# Patient Record
Sex: Female | Born: 1945 | Race: Black or African American | Hispanic: No | Marital: Married
Health system: Midwestern US, Community
[De-identification: ages and names within clinical notes are randomized; demographics above are authoritative.]

## PROBLEM LIST (undated history)

## (undated) ENCOUNTER — Ambulatory Visit (HOSPITAL_COMMUNITY)

## (undated) DIAGNOSIS — G309 Alzheimer's disease, unspecified: Secondary | ICD-10-CM

## (undated) DIAGNOSIS — F0280 Dementia in other diseases classified elsewhere without behavioral disturbance: Secondary | ICD-10-CM

## (undated) DIAGNOSIS — E119 Type 2 diabetes mellitus without complications: Secondary | ICD-10-CM

## (undated) DIAGNOSIS — F0390 Unspecified dementia without behavioral disturbance: Secondary | ICD-10-CM

## (undated) DIAGNOSIS — I10 Essential (primary) hypertension: Secondary | ICD-10-CM

## (undated) DIAGNOSIS — M48061 Spinal stenosis, lumbar region without neurogenic claudication: Secondary | ICD-10-CM

## (undated) DIAGNOSIS — M47816 Spondylosis without myelopathy or radiculopathy, lumbar region: Secondary | ICD-10-CM

## (undated) DIAGNOSIS — M47896 Other spondylosis, lumbar region: Secondary | ICD-10-CM

## (undated) HISTORY — PX: ABDOMINAL HYSTERECTOMY: SHX81

## (undated) HISTORY — PX: COLON SURGERY: SHX602

---

## 1998-10-05 NOTE — Progress Notes (Signed)
[facility]                       PHYSICAL THERAPY INITIAL EVALUATION   PATIENT NAME:  Angela, Potter   MR#:  [id number]   DATE:  10/01/1998   REFERRING PHYSICIAN:  Elvera Maria, M.D.   SUBJECTIVE:  The patient is a 53 year old black female with complaints of   left gluteal pain beginning two months ago. She was referred to physical   therapy for low back pain 3x a week for two weeks.  She has been given no   precautions.   HISTORY OF PRESENT ILLNESS:  The patient states her left gluteal region   feels as though as knife is stabbing her.  In the morning it is the worst.   She is unable to stand erect and hobbles into the bathroom.  She states it   does ease off after her shower and with movement.  Again in the evening,   when she starts resting and getting ready for bed, the pain does begin to   appear again.  She is unable to sleep and the medications are helping.  She   states this pain began approximately two months ago with no known reason.   She rates her pain as a 1-10/10.   MEDICATIONS:  Lozol, ACE inhibitor, oral medication for diabetes, Celebrex,   Flexeril and currently her Celebrex was changed to Vioxx.   ALLERGIES:  She has no known allergies.   PAST MEDICAL HISTORY:  She states she has arthritis in her left knee,   diabetes, and high blood pressure.  She has not received any x-rays and she   has not had physical therapy before.   OJBECTIVE:  The patient is overweight with increased lordosis.  Range of   motion of her trunk is within normal limits.  Manual muscle testing of her   lower extremities was not tested at this time.   PALPATION:  The patient exhibits tenderness and tightness of her right   L5-S1 and PSIS area. The therapist was unable to palpate or elicit pain in   her left buttock.  The pain was reproducible in her left buttock while the   patient was prone and propped up on elbows.  The patient was lying with her    hips slightly rotated to the left when her hips were equalized, the pain   decreased in intensity.   POSTURE:  The patient exhibits a right PSIS more posteriorly, right ASIS   higher. She has pain on the right PSIS area.  She has a right posteriorly   rotated inonimate.   TODAY'S TREATMENT: The patient received an evaluation, ultrasound, heat and   therapeutic exercise to increase pelvic flexibility.   ASSESSMENT AND PROBLEMS:   1.  The patient has pain.   2.  Tightness.   3.  Right posteriorly rotated inonimate.   4.  Decreased functional activity.   SHORT TERM GOALS:   1.  Decrease pain to 0-2/10.   2.  Decrease palpable tightness.   3.  Equalize inonimate.   4.  Initiate home exercise program.   REHABILITATION POTENTIAL: Good.   PLAN:  See patient 3x a week for two weeks for low back pain and left   gluteal pain.   Thank you for this referral.   Hilda Blades, PT   ksf  D:  10/01/1998  T:  10/05/1998 11:18 A   [id number]

## 1998-10-17 NOTE — ED Provider Notes (Signed)
[facility]                      EMERGENCY DEPARTMENT TREATMENT REPORT   NAME:  Angela Potter, Angela Potter   MR#:  [id number]     BILLING #: [id number]         DOS: 10/17/1998  TIME: 1:57                                                                    P   cc:   TODD Wilfrid Lund, M.D.   Primary Physician:   CHIEF COMPLAINT:   Hip pain.   HISTORY OF PRESENT ILLNESS:   The patient is a 53 year old female who   presents with left hip pain that has been ongoing for the last 3 months   time, describes it as centering in her left upper buttock area and   radiating down into the thigh.  Seems to be worse in the mornings, better   with movement.  She denies any associated fever.  She is followed at [facility]   for it and has been on Vioxx and Flexeril with some mild improvement but   still has persistent symptoms.  She denies any focal numbness or weakness.   REVIEW OF SYSTEMS:   MUSCULOSKELETAL:   Positive as noted.   PAST MEDICAL HISTORY:   Remarkable for diabetes mellitus, hypertension,   arthritis in the knees and sciatic.   PHYSICAL EXAMINATION:   GENERAL:   The patient is alert and cooperative.   VITAL SIGNS:   Blood pressure  147/90, pulse rate of 107, respiratory rate   20, temperature 98.2.   RESPIRATORY:  Clear and equal BS.  No respiratory distress, tachypnea, or   accessory muscle use.   CARDIOVASCULAR:  Heart regular, without murmurs, gallops, rubs, or thrills.   PMI not displaced.   EXTREMITIES:   Exam shows left posterior hip tenderness to palpation.  She   has full range of motion about the left hip, knee, and ankle. Strength is 5   out of 5 in all groups.  Sensory function grossly intact.  Peripheral   neurovascular function intact.   FINAL DIAGNOSIS:   1.  Acute musculoskeletal left hip pain.   2.  Acute sciatica.   DISPOSITION:   The patient was given a Toradol shot.  She was discharged   home with a prescription for Vioxx and Medrol Dosepak.  She was instructed    that the Medrol Dosepak could cause an elevation in her blood sugar and   this needed to be monitored closely.  She is to follow-up with her primary   care physician for ongoing outpatient workup.  The patient was discharged   home in stable condition.   ____________________________   Erlinda Hong, M.D.   ec D:  10/17/1998 T:  10/19/1998 10:09 A   [id number]

## 1999-09-03 NOTE — ED Provider Notes (Signed)
[facility]                      EMERGENCY DEPARTMENT TREATMENT REPORT   NAME:  Angela Potter, Angela Potter   MR #:  [id number]   BILLING #: [id number]        DOS: 09/03/1999  TIME:10:40 A   cc:   Primary Physician:   INITIAL DIAGNOSIS:  Abdominal pain, gastroenteritis.   OPPU  COURSE:  The patient was sent for an ultrasound which is read by   radiology as negative.  The patient did have some pain, she was given   morphine.  Upon re-evaluation she is feeling better. She states she did   move a heavy chair yesterday which very well may account for her pain.  Her   abdomen though is benign at this point.   PHYSICAL EXAMINATION: Upon discharge.   VITAL SIGNS:  Blood pressure 146/79, pulse 55, respirations 18, temperature   98.0.   GENERAL:   The patient is alert and relatively comfortable.   HEENT:   Eyes:  Conjunctiva clear, lids normal.  Pupils equal, symmetrical,   and normally reactive.   Fundi:  Optic discs are normal in appearance, no   gross hemorrhages or exudates seen.   Ears/Nose:  Hearing is grossly intact   to voice.  Internal and external examinations of the ears are unremarkable.   RESPIRATORY:  Clear and equal BS.  No respiratory distress, tachypnea, or   accessory muscle use.   CARDIOVASCULAR:  Heart regular, without murmurs, gallops, rubs, or thrills.   PMI not displaced.   CHEST:  Chest symmetrical without masses or tenderness.   GI:  Abdomen soft, nontender, without complaint of pain to palpation.  No   hepatomegaly or splenomegaly. No abdominal or inguinal masses appreciated   by inspection or palpation.   MUSCULOSKELETAL:    Nails:  No clubbing or deformities.  Nailbeds pink with   prompt capillary refill.   SKIN:  Warm and dry without rashes.   FINAL DIAGNOSIS:   1.  Abdominal pain, most likely musculoskeletal.   DISPOSITION:   The patient is discharged home in stable condition, with   instructions to follow up with their regular doctor.  They are advised to    return immediately for any worsening or symptoms of concern. The patient is   given Flexeril and Vicodin for home.  Recommended to take clear liquids   today and rest at home and call her doctor should condition worsen or   return to the Emergency Department.   Electronically Signed By:   Truitt Merle, M.D. 09/08/1999 22:20   ____________________________   Truitt Merle, M.D.   Dwaine Deter D:  09/04/1999 T:  09/07/1999  7:11 P   [id number]

## 1999-09-03 NOTE — ED Provider Notes (Signed)
[facility]                      EMERGENCY DEPARTMENT TREATMENT REPORT   OUTPATIENT CENTER ASSIGNMENT   NAME:  Angela Potter, Angela Potter   MR #:  [id number]   BILLING #: [id number]        DOS: 09/03/1999  TIME:11:29 P   cc:   OPC COPY   Primary Physician:  Colbert Ewing, M.D.   CHIEF COMPLAINT:   Abdominal pain.   HISTORY OF PRESENT ILLNESS:    This 54 year old female complains of   epigastric abdominal pain since 1400 hours.  She has had nausea with it.   She had just eaten ____ and vegetables right before, can't get comfortable.   This pain is new for her.  It does not radiate, stays in one spot, hurts   when she takes a deep breath.  Also hurts when she pushes on the area.   REVIEW OF SYSTEMS:   No diarrhea, no shortness of breath, no chest pain.  No melena or   hematochezia.  No urinary symptoms.  No trouble with bowel movements.   INTEGUMENTARY:  No rashes.   MUSCULOSKELETAL:  No joint pain or swelling.   GENITOURINARY:  No dysuria, frequency, or urgency.   ALLERGIC/IMMUNOLOGIC:  No urticaria or allergy symptoms.   HEMATOLOGIC/LYMPHATIC:  No excessive bruising or lymph node swelling.   ENDOCRINE:  No diabetic symptoms.   All other systems negative.   PAST MEDICAL HISTORY:   Diabetes, hypertension, arthritis.   FAMILY HISTORY:   Brother has biliary disease.   SOCIAL HISTORY:   Does not smoke or drink.   ALLERGIES:   None.   MEDICATIONS:   Lozar (?), Atenolol, and a new medication she can't remember   the name of.   PHYSICAL EXAMINATION:   VITAL SIGNS:   Blood pressure 158/79, pulse 69, respiratory rate 18,   temperature 96.7. Pain 10/10.   GENERAL APPEARANCE:   She is a well-developed, well-nourished female,   pacing, crying, holding her epigastric area. Can't get comfortable.   ENT:  Ears/Nose:  Hearing is grossly intact to voice.  Internal and   external examinations of the ears and nose are unremarkable.   NECK:  Supple, nontender, symmetrical, no masses or JVD, trachea midline,    thyroid not enlarged, nodular, or tender.   RESPIRATORY:  Clear and equal BS.  No respiratory distress, tachypnea, or   accessory muscle use.   CARDIOVASCULAR:  Heart regular, without murmurs, gallops, rubs, or thrills.   PMI not displaced.   CHEST:  Chest symmetrical without masses or tenderness.   ABDOMEN:  Exam shows tenderness in epigastric area.  Palpation reproducing   exacerbating pain. No guarding or rebound. Occasional bowel sounds heard.   No cva tenderness.  No lower abdominal tenderness whatsoever.   NEUROLOGICAL:   Cranial nerves, deep tendon reflexes, strength, and light   touch sensation are unremarkable.   VASCULAR:   Calves soft and nontender.  No peripheral edema or significant   varicosities. Carotid, femoral, and pedal pulses are satisfactory.  The   abdominal aorta is not palpably enlarged.   PSYCHIATRIC:   Judgment appears appropriate.  Recent and remote memory   appear to be intact.  Oriented to time, place, and person.  Mood and affect   appropriate.   IMPRESSION/MANAGEMENT PLAN:    The patient presents with pain in the   epigastric area and needs to be evaluated for biliary  colic versus   pancreatitis.  She had a colon resection back in November and also needs to   be evaluated for bowel obstruction.  Did have a normal bowel movement this   morning.  As an undiagnosed new problem with uncertain prognosis, this is a   moderate risk presentation.  Nursing notes were reviewed.  Old records   reviewed.   DIAGNOSTIC TESTING:   CMP within normal limits.  Haseley count is 8.9, H&amp;H   36/12, lipase 134.   COURSE IN THE EMERGENCY DEPARTMENT:   The patient was given Phenergan and   morphine and felt much better.  At 3:00 she even contemplated going home   but is still very tender in that area. No peritoneal signs seen.  I gave   her more morphine and Phenergan and Prilosec and the patient was placed in   the Outpatient Center for continuing pain management.  It appears that she    has gastritis but may have biliary colic considering the severity of her   pain.  Ultrasound will be obtained in the morning.   FINAL DIAGNOSIS:   1.  Acute abdominal pain, epigastric.   2.  Biliary colic versus gastritis.   3.  History of diabetes, hypertension, and arthritis.   Electronically Signed By:   Wetzel Bjornstad Arvella Merles, M.D. 09/05/1999 04:47   ____________________________   Wetzel Bjornstad. Arvella Merles, M.D.   ec/ec  D:  09/03/1999 T:  09/04/1999  7:18 A   [id number]

## 2008-05-15 NOTE — Progress Notes (Addendum)
 ASSESSMENT:   1-T2DM Pt reports eye exam later this month On asa/ace/statin -tsh, m/c/hgba1c -f/u 4wks  2-HPL -lipid, cmp  3-stress incontinence -educated re: Kegel's exercise  4-H.M. 12/07 colonoscopy Cannot recall last Td Never had an abnormal Pap, reports last one done '08.  Reviewed w/pt that if has never had an abnormal pap serially then can procede q2-87yrs and consider stopping entirely at age 62.  Pt has signed release for MR-will review prior Paps -mammogram -given infor re: zostavax -H1N1 today (rec: seasonal flu shot if available to her) -?last DEXA -need to clarify reason for colon resection and back pain issues  5-HTN Good control today. EKG today: SR, VR=58, T wave inversion V1, ST-T wave flattening V3, no ischmeic changes, no prior to compare PLAN:   Discussed evaluation, treatment and usual course. Patient verbalizes understanding and agrees with plan of care. All questions were answered. Orders:   Orders Placed This Encounter  Procedure  . Pr-systolic bp <130 mm hg  . Pr-diastolic bp 80-89 mm hg  . Pr-hgba1c<7.0% if w/in last 12 mos  . Pr-ldl not performed w/in last 12 mos  . Pr-systolic bp <130 mm hg  . Pr-hgba1c<7.0% if w/in last 12 mos  . Pr-ldl<100 mg/dl if w/in last 12 mos  . Pr-routine venipuncture  . Mammo-diagnostic bilat  . Cbc with differential  . Comprehensive metabolic panel  . Lipid complete panel  . Tsh  . Hemoglobin a1c  . Vitamin d 25 hydroxy  . Microalbumin random urine     SUBJECTIVE:   Chief Complaint  Patient presents with  . ESTABLISH CARE  . HYPERTENSION  . DIABETES  . LIPIDS    62y/o AAF Seeing Dr Lenox who sent pt to PT for right lateral hand tendinitis-wearing a wrist splint. Mother lives w/pt w/Alzheimer's.  Husband had a stroke 1.23yrs ago.  Doesn't use her glucometer. Hgba1c=6.7 per pt's recall.  Doesn't exercise routinely-works full time.  HPI  TO ESTABLISH.  SHE HAS DM, HTN, CHOLESTEROL, AND ARTHRITIS.   HAD A CPE IN 2008.     Review of Systems  Constitutional:       Energy good  HENT:       HAs 1-2times in a month  Cardiovascular: Negative for chest pain.  Respiratory: Negative for cough.  Is not experiencing shortness of breath.  Gastrointestinal: Negative for heartburn, nausea, vomiting and abdominal pain.       3 times a week BM-formed  Genitourinary: Negative for dysuria and frequency.       Urinary leakage w/laughing  Psychiatric:       Sleeps 4hrs a night as doing her work as her work day is broken up taking her husband and mother to different doctor appts    OBJECTIVE   BP 122/80  Pulse 72  Resp 18  Wt 202 lb (91.627 kg)  LMP Hysterectomy  Physical Exam  Nursing note and vitals reviewed. Constitutional: She is oriented and well-developed, well-nourished, and in no distress.  HENT:  Head: Normocephalic and atraumatic.  Right Ear: Tympanic membrane, external ear and ear canal normal.  Left Ear: Tympanic membrane, external ear and ear canal normal.  Mouth/Throat: Oropharynx is clear and moist. No oropharyngeal exudate.  Eyes: Conjunctivae are normal.  Neck: Neck supple. No thyromegaly present.  Cardiovascular: Normal rate, regular rhythm and intact distal pulses.  Exam reveals no gallop and no friction rub.   No murmur heard.      No carotid bruit  Pulmonary/Chest: Effort normal and breath  sounds normal. She has no wheezes. She has no rales. Right breast exhibits no inverted nipple, no mass, no nipple discharge, no skin change and no tenderness. Left breast exhibits no inverted nipple, no mass, no nipple discharge, no skin change and no tenderness. Breasts are symmetrical.  Abdominal: Bowel sounds are normal. She exhibits no distension and no mass. Soft. No tenderness. She has no rebound and no guarding.  Musculoskeletal: She exhibits no edema.  Lymphadenopathy:    She has no cervical adenopathy.    She has no axillary adenopathy.  Neurological: She is alert and  oriented. Gait normal.       Monofilament intact  Skin: Skin is warm and dry.       Skin tags base of neck  Psychiatric: Mood and affect normal.    -end-

## 2008-06-23 NOTE — Progress Notes (Signed)
 ASSESSMENT:   As detailed in the progress note below, the patient was assessed for:  Encounter Diagnoses  Code Name Primary? Qualifier  . 250.00 DIABETES MELLITUS TYPE II-UNCOMPL Yes     1-T2DM HEMOGLOBIN A1C  Date Value Range Status  05/15/08 7.5* 4.8-5.9 (%) Final  Reviewed prior hgba1c.  Pt never picked up samples of Januvia.  She wants to try bringing down her BS thru diet rather than adding another medication.  She has had diabetes for close to 2 decades.  Shared my concern that in that time she generally has not been controlled, thus her risk for end organ damage is extremely high. She was reminded again to schedule for her annual eye exam. 12/09 m/c, tsh; foot exam On asa/ace/statin -f/u 3mos w/hgba1c prior to visit  2-HTN Vitals 05/15/2008 06/23/2008 06/23/2008  BP 122/80  132/84  Continue to trend 12/09 ekg  3-HPL Lab Results  Basename Value Date/Time  . CHOLESTEROL 151 05/15/08 11:29 AM  . HDL 50 05/15/08 11:29 AM  . LDLIPO 89 05/15/08 11:29 AM  . TRIGLYCERIDE 62 05/15/08 11:29 AM   Normal ast/alt on zocor 20mg   4-H.M. Pt does have prior MR-she will bring to f/u visit-as still not clear what part of colon removed in '00 and why?  Pt recalls 12/07 csp also cannot recall last Td She reports DEXA '09 at [facility] c/w osteopenia Recalls last Pap '08 and never had abnormal-?rpt in 2011 and consider stopping post that one per '09 ACOG guidelines  5-abnormal right breast mammogram Calcifications noted 12/09 mammogram-rec: 64mo rpt -referral given to pt to schedule 11/2008 PLAN:   Discussed evaluation, treatment and usual course. Patient verbalizes understanding and agrees with plan of care. All questions were answered.  Medication Orders this Encounter  Medication  . Blood-glucose meter kit  . Blood sugar diagnostic test strips   Side effects of prescribed medicine was discussed. Orders:   Orders Placed This Encounter  Procedure  . Pr-systolic bp 130-139 mm hg  .  Pr-diastolic bp 80-89 mm hg  . Pr-hgba1c 7-9% if w/in last 12 mos  . Pr-ldl<100 mg/dl if w/in last 12 mos  . Mammo-diagnostic rt  . Varicella-zoster(zostavax->25yrs)vac inj      SUBJECTIVE:   Chief Complaint  Patient presents with  . DIABETES  . HYPERTENSION    63y/o AAMF Actually has just decided that she will pursue the right wrist surgery-doesn't yet have a date.  Understands that will need a separate pre op visit for this.  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  Is requesting a new glucometer and strips.  HPI  FU DM AND HYPERTENSION.  SHE FEELS DEPRESSED TODAY.  SHE IS TO HAVE R WRIST SURGERY  BY DR EMILY.     Review of Systems  Constitutional: Negative for fever and chills.  HENT:       HAs a couple of times a month  Cardiovascular: Negative for chest pain and leg swelling.  Gastrointestinal: Negative for heartburn, nausea, vomiting and abdominal pain.       BM about 3 times a week  Genitourinary:       Urinary stress incontinence  Psychiatric:       Takes care of both her husband(stroke) and her mother(ALzheimer)    OBJECTIVE   BP 132/84  Pulse 80  Resp 16  Wt 203 lb (92.08 kg)  LMP Hysterectomy  Physical Exam  Nursing note and vitals reviewed. Constitutional: She is oriented and  well-developed, well-nourished, and in no distress.  HENT:  Head: Normocephalic and atraumatic.  Eyes: Conjunctivae are normal.  Neck: No thyromegaly present.  Cardiovascular: Normal rate and regular rhythm.  Exam reveals no gallop and no friction rub.   No murmur heard. Pulmonary/Chest: Effort normal and breath sounds normal. She has no wheezes. She has no rales.  Musculoskeletal: She exhibits no edema.  Lymphadenopathy:    She has no cervical adenopathy.  Neurological: She is alert and oriented. Gait normal.  Skin: Skin is warm and dry.    -end-

## 2008-09-22 NOTE — Progress Notes (Signed)
 ASSESSMENT:   As detailed in the progress note below, the patient was assessed for:  Encounter Diagnoses  Code Name Primary? Qualifier  . 250.00 DIABETES MELLITUS TYPE II-UNCOMPL Yes    Plan: PR-SYSTOLIC BP >140 MM HG, PR-DIASTOLIC BP 80-89 MM HG, PR-HGBA1C 7-9% IF W/IN LAST 12 MOS, PR-LDL<100 MG/DL IF W/IN LAST 12 MOS    1-T2DM HGB  Date Value Range Status  05/15/08 12.8  11.7-16.0 (G/DL) Final   Congratulated on 6# wt loss since 1/11 visit.   2/'10 eye exam 12/09 m/c, tsh; foot exam On asa/ace/statin -17mo f/u  2-HTN Vitals 05/15/2008 06/23/2008 09/22/2008 BP     122/80    132/84    140/80 12/09 ekg Pt's husband has a bp monitor, pt to check bp 1-2 times a week and bring monitor in to f/u visit. -increase lisinopil to 40mg  daily  3-HPL Lab Results  Basename Value Date/Time  . CHOLESTEROL 151 05/15/08 11:29 AM  . HDL 50 05/15/08 11:29 AM  . LDLIPO 89 05/15/08 11:29 AM  . TRIGLYCERIDE 62 05/15/08 11:29 AM   On zocor 20mg  w/normal ast/alt  4-pruritic trunk rash -TAC 0.1%  5-H.M.  Pt does have prior MR-she will bring to f/u visit-as still not clear what part of colon removed in '00 and why? Pt recalls 12/07 csp  also cannot recall last Td  She reports DEXA '09 at [facility] c/w osteopenia  Recalls last Pap '08 and never had abnormal-?rpt in 2011 and consider stopping post that one per '09 ACOG guidelines  6-abnormal right breast mammogram  Calcifications noted 12/09 mammogram-rec: 6mo rpt  -referral given to pt to schedule 11/2008 PLAN:   Discussed evaluation, treatment and usual course. Patient verbalizes understanding and agrees with plan of care. All questions were answered.  Medication Orders this Encounter  Medication  . Lisinopril  40 mg tab    Sig: Take 1 Tab by Mouth Once a Day.     Dispense: 90    Refill: 1  . Triamcinolone acetonide 0.1 % topical cream    Sig: Use 1 Application to affected area 2 Times Daily As Needed     Dispense: 60gm    Refill: 1    Side effects of prescribed medicine was discussed. Orders:   Orders Placed This Encounter  Procedure  . Pr-systolic bp >140 mm hg  . Pr-diastolic bp 80-89 mm hg  . Pr-hgba1c 7-9% if w/in last 12 mos  . Pr-ldl<100 mg/dl if w/in last 12 mos      SUBJECTIVE:   Chief Complaint  Patient presents with  . MEDICAL PROBLEM RE-EVALUATION    T2DM, HTN and HPL    63y/o AAF Had right wrist surgery done and has f/u today.  HPI   Review of Systems  Constitutional: Negative for fever and chills.  Skin:       Mild pruritic rash on back for which applying eucerin cream w/some relief  HENT: Negative for headaches.   Eyes: Negative for double vision.  Cardiovascular: Negative for chest pain and leg swelling.  Respiratory: Negative for cough.  Is not experiencing shortness of breath.  Gastrointestinal: Negative for heartburn, nausea, vomiting and abdominal pain.       Mild constipation- senokot-s 2 times a week  Genitourinary: Negative for dysuria and frequency.    OBJECTIVE   BP 140/80  Pulse 72  Temp (Src) 98.5 F (36.9 C) (Oral)  Resp 16  Wt 197 lb 9.6 oz (89.631 kg)  LMP Hysterectomy There is no height on  file for this encounter.   Physical Exam  Nursing note and vitals reviewed. Constitutional: She is oriented and well-developed, well-nourished, and in no distress.  HENT:  Head: Normocephalic and atraumatic.  Eyes: Conjunctivae are normal.  Neck: Neck supple. No thyromegaly present.  Cardiovascular: Normal rate, regular rhythm and intact distal pulses.  Exam reveals no gallop and no friction rub.   No murmur heard.      No carotid bruit  Pulmonary/Chest: Effort normal and breath sounds normal. She has no wheezes. She has no rales.  Musculoskeletal: She exhibits no edema.  Lymphadenopathy:    She has no cervical adenopathy.  Neurological: She is alert and oriented. Gait normal.  Skin: Skin is warm and dry.       Small patches of hyperpigmented macular on back   Psychiatric: Mood and affect normal.    -end-

## 2008-12-22 NOTE — Progress Notes (Signed)
 ASSESSMENT:  1-T2DM Concerned that pt taking sugar pills?  WIll decrease metformin in the evening to see if will decrease AM need for sugar pill. Foot exam today 2/'10 eye exam 12/09 m/c, tsh On asa/ace/statin -hgba1c today -decrease metformin to 1gm AM, 500mg  PM -reassess in one month  2-HTN Vitals 06/23/2008 09/22/2008 12/22/2008 BP     132/84    140/80    124/82 12/09 ekg.  Does have bp monitor (didn't bring today) Lisinopril  increased from 20mg  to 40mg  at 4/12 visit .  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 CHOLESTEROL 151 05/15/08 11:29 AM  . HDL 50 05/15/08 11:29 AM  . LDLIPO 89 05/15/08 11:29 AM  . TRIGLYCERIDE 62 05/15/08 11:29 AM   -ast/alt today  4-H.M. Pt brought copy of colonoscopy indeed done 12/07-no polyp noted, nor reason for getting exam.  [place] [id number].  Need to clarify reason for colon resection Recalls DEXA at [facility] as 12/08 c/w osteopenia 4/'10 vit D=39.2 -anticipate DEXA 12/'10 -pt to investigate last Td PLAN:   Discussed evaluation, treatment and usual course. Patient verbalizes understanding and agrees with plan of care. All questions were answered.  Medication Orders this Encounter  Medication  . Multivitamin cap    Sig: Take by Mouth Once a Day.   . Metformin 500 mg tab    Sig: Take 1 Tab by Mouth See Admin Instrs. 2 TABS AM, 1 tab PM     Dispense: 120    Refill: 3   Side effects of prescribed medicine was discussed. Orders:   Orders Placed This Encounter  Procedure  . Pr-systolic bp <130 mm hg  . Pr-diastolic bp 80-89 mm hg  . Pr-hgba1c<7.0% if w/in last 12 mos  . Pr-ldl<100 mg/dl if w/in last 12 mos  . Annual foot exam  . Ast  . Alt  . Hemoglobin a1c      SUBJECTIVE:   Chief Complaint  Patient presents with  . DIABETES  . HYPERTENSION  . HYPERLIPIDEMIA   62y/o AAF By recall 130/80-forgot to bring bp monitor for correlation BS by recall- this AM 137, normally  still higher in the AM=175 avg, 90s mid days, pre bed, 100-120.  Reports that uses a bottle of sugar pills in 3-4wks.  Takes it for feeling jittery, weak without diaphoresis.  Resolves post taking.  She has checked BS at the time and generally low 100s.  Can occur at any time during the day but propensity for the AM.  HPI  FU DM, HTN, AND HYPERLIPIDEMIA.  NO CONCERNS FROM PATIENT.     Review of Systems  Constitutional: Negative for fever and chills.  HENT: Negative for headaches.   Eyes: Negative for double vision.  Cardiovascular: Negative for chest pain and leg swelling.  Respiratory: Negative for cough.  Is not experiencing shortness of breath.  Gastrointestinal: Negative for heartburn, nausea and vomiting.  Genitourinary: Negative for dysuria.  Musculoskeletal:       Going to PT for back and right wrist thru ortho  Neurological: Negative for dizziness.  Psychiatric: Negative for depression.     OBJECTIVE   BP 124/82  Pulse 74  Resp 16  Wt 199 lb (90.266 kg)  LMP Hysterectomy  BMI: Data Unavailable   Physical Exam  Nursing note and vitals reviewed. Constitutional: She is oriented and well-developed, well-nourished, and in no distress.  HENT:  Head: Normocephalic and atraumatic.  Eyes: Conjunctivae are normal.  Neck: Neck supple. No thyromegaly present.  Cardiovascular: Normal rate, regular rhythm and intact distal pulses.  Exam reveals no gallop and no friction rub.   No murmur heard.      No carotid bruit  Pulmonary/Chest: Effort normal and breath sounds normal. She has no wheezes. She has no rales.  Musculoskeletal: She exhibits no edema.  Lymphadenopathy:    She has no cervical adenopathy.  Neurological: She is alert and oriented. Gait normal.       Monofilament intact  Skin: Skin is warm and dry.  Psychiatric: Mood and affect normal.    -end-

## 2009-01-26 NOTE — Progress Notes (Addendum)
 ASSESSMENT:   1-T2DM Still taking sugar pills though less than prior to 7/12 visit when metformin decreased from 1gm bid to 1gm AM and 500mg  PM.  Never has correlated w/sugar<100 though symptomatically pt feels better when takes the sugar pill!  She describes feeling lightheaded/dizzy/jittery, weak.  Never has diaphoresis. Previously had reported that felt this way thru out the day but more so in the AM, now believes more prominent in the mid to late afternoon. 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 Vitals 06/23/2008 09/22/2008 12/22/2008 01/26/2009  BP 132/84 140/80 124/82 136/84  12/09 ekg Lisinopril  increased from 20mg  to 40mg  at 4/12 visit  5-H.M. Recalls DEXA at [facility] as 12/08 c/w osteopenia Lab Results  Component Value Date   VITD25OH 39.2 09/12/2008   -anticipate DEXA 12/'10 -adacel  6-right breast calcifications Apparently stereotactic bx attemptted this past Friday by Tessa but not obtained-pt to have films reviewed at [facility] Generally this Friday by Dr Suellyn recommendation  7-LLE paresthesias ?diabetic mononeuropathy vs. Radicular etiology.  Given current level of mild discomfort, management would not change whatever the etiology.  As reviewed w/pt, defer back management to Dr Conan. PLAN:   Discussed evaluation, treatment and usual course. Patient verbalizes understanding and agrees with plan of care. All questions were answered. Medication Orders this  Encounter  Medication  . Potassium chloride sr (klor-con m20) 20 meq po tbtq    Sig: Take 1 Tab by Mouth Once a Day.    Dispense:  30 Tab    Refill:  3  . Indapamide (lozol) 2.5 mg po tabs    Sig: Take 1 Tab by Mouth Once a Day.    Dispense:  30 Tab    Refill:  3  . Simvastatin (zocor) 20 mg po tabs    Sig: Take 1 Tab by Mouth Every Night at Bedtime.    Dispense:  30 Tab    Refill:  3    Side effects of prescribed medicine was discussed. Orders:  Orders Placed This Encounter  Procedure  . Systolic bp 130-139 mm hg  . Diastolic bp 80-89 mm hg  . Hgba1c<7.0% if w/in last 12 mos  . Ldl<100 mg/dl if w/in last 12 mos  . Diabetic foot exam  . Tdap o.5 ml(boostrix) vac  . Lipid complete panel  . Basic metabolic panel      SUBJECTIVE:   Chief Complaint  Patient presents with  . DIABETES  . HYPERTENSION  . HYPERLIPIDEMIA  . NUMBNESS    TINGLING IN L LOWER LEG AND L FOOT   PT working on her low back and right wrist For the past 2 wks has felt a tingling in LLE and foot.  Walking causes sharp jabbing pain in left low back and buttocks.  Occurs intermittently thru out the day though has periods during the day that doesn't have it.  Never at night.  Occurs either at rest or w/activity. Dr Conan is ortho working on  her back (last MRI lumbar done in '07) Dr Emily did a cervical MRI  In an avg week using 10-15 sugar pills (previously a bottle in a week). 176, 152, 142, 140 2 hrs post lunch=134 5:30PM 106 2:37 AM 134 12:30PM 102 HPI   Review of Systems  Constitutional: Negative for fever, chills and malaise/fatigue.  Eyes: Negative for double vision and pain.  Cardiovascular: Negative for chest pain and leg swelling.  Gastrointestinal: Negative for heartburn, nausea, vomiting and abdominal pain.  Genitourinary: Negative for dysuria and frequency.  Neurological: Negative for headaches.    OBJECTIVE   BP 136/84  Pulse 74  Resp 16  Wt 199 lb (90.266 kg)  BMI: Data  Unavailable   Physical Exam  Nursing note and vitals reviewed. Constitutional: She is oriented to person, place, and time and well-developed, well-nourished, and in no distress.  HENT:  Head: Normocephalic and atraumatic.  Mouth/Throat: Oropharynx is clear and moist. No oropharyngeal exudate.  Eyes: Conjunctivae are normal.  Neck: Neck supple. No thyromegaly present.  Cardiovascular: Normal rate and regular rhythm.  Exam reveals no gallop and no friction rub.        No carotid bruit 2/6 midsystolic SEM RUSB  Pulmonary/Chest: Effort normal and breath sounds normal. She has no wheezes. She has no rales.  Musculoskeletal: She exhibits no edema.  Lymphadenopathy:    She has no cervical adenopathy.  Neurological: She is alert and oriented to person, place, and time. Gait normal.       Monofilament feet intact str dorsi and plantar flex 1st toe 5/5 Proprioception 1st toes intact  Skin: Skin is warm and dry.  Psychiatric: Mood and affect normal.    FU DM , HTN, AND HYPERLIPIDEMIA.  C/O TINGLING IN HER L LOWER LEG AND L FOOT.

## 2009-03-23 NOTE — Progress Notes (Signed)
 ASSESSMENT:   As detailed in the progress note below, the patient was assessed for:  1. DIABETES MELLITUS TYPE II-UNCOMPL (250.00)  SYSTOLIC BP <130 MM HG, DIASTOLIC BP <80 MM HG, HGBA1C<7.0% IF W/IN LAST 12 MOS, LDL<100 MG/DL IF W/IN LAST 12 MOS, DIABETIC FOOT EXAM  2. Need for prophylactic vaccination and inoculation against influenza (V04.81)  FLU VACCINE > 63 YRS OLD, IMMUNIZATION ADM, SINGLE OR COMBO VACCINE-FLU [000528]   1-T2DM Doesn't always check her BS before taking the sugar pill but has in the past and admits that never <100.  Again attempted to relay to pt that if sugars truly are too low then need to back off on medication, NOT add more sugar.  Pt was apparently told by prior PCP to carry these sugar pills around just in case.  Her usage has decreased as we have decreased diabetic meds.  Counseled pt that sxs are likely NOT due to low sugars and may need to look for other etiologies if persist despite decreasing meds. HEMOGLOBIN A1C  Date Value Range Status  12/22/2008 6.5* 4.8-5.9 (%) Final  7/'10 foot exam 12/09 m/c, tsh On asa/ace/statin -decrease metformin to 500mg  bid -consider decrease of glipizide as well  2-HTN My check left arm 138/90, pt's monitor 139/79 P=65 She reports sbp at home in the evenings 150-160s Unable to increase atenolol further due to bradycardia 12/09 cbc 12/09 ekg -d/c atenolol -substitute w/norvasc  5mg  daily -SE of peripheral edema reviewed and HA -f/u 3-4 wks -will need UA at next lab  3-HPL Lab Results  Component Value Date/Time   CHOLESTEROL 151 01/26/2009 10:17 AM   HDL 55 01/26/2009 10:17 AM   LDLIPO 85 01/26/2009 10:17 AM   TRIGLYCERIDE 56 01/26/2009 10:17 AM  7/'10 ast/alt On zocor 20mg  meeting LDL goal<100.  Non HDL=96  4-H.M. Pt recalls DEXA at [facility] in 12/08 as c/w osteopenia' Lab Results  Component Value Date   VITD25OH 39.2 09/12/2008  -anticipate DEXA 12/'10  5-right breast calcifications Sees Dr Tessa.  To have repeat  mammogram in Dex and f/u w/her in Feb  6-LLE paresthesias Pt had seen Dr Conan in f/u who had rec: surgery though PT/epidural injection also offered.  Pt is considering which to do if any.    PLAN:   Discussed evaluation, treatment and usual course. Patient verbalizes understanding and agrees with plan of care. All questions were answered. Medication Orders this Encounter  Medication  . Omega-3 fatty acids/vitamin e (omega-3 fish oil po)    Sig: Take  by Mouth.  . Ibuprofen po    Sig: Take  by Mouth.  . Metformin (glucophage) 500 mg po tabs    Sig: Take 1 Tab by Mouth See Admin Instrs. 2 TABS AM, 1 tab PM    Dispense:  180 Tab    Refill:  1  . Glipizide xl (glucotrol xl) 10 mg po tr24    Sig: Take 1 Tab by Mouth Once a Day.    Dispense:  30 Tab    Refill:  3  . Lisinopril  (prinivil ) 40 mg po tabs    Sig: Take 1 Tab by Mouth Once a Day.    Dispense:  90 Tab    Refill:  1  . Indapamide (lozol) 2.5 mg po tabs    Sig: Take 1 Tab by Mouth Once a Day.    Dispense:  90 Tab    Refill:  1  . Simvastatin (zocor) 20 mg po tabs    Sig: Take 1  Tab by Mouth Every Night at Bedtime.    Dispense:  90 Tab    Refill:  1  . Potassium chloride sr (klor-con m20) 20 meq po tbtq    Sig: Take 1 Tab by Mouth Once a Day.    Dispense:  90 Tab    Refill:  1  . Blood-glucose meter (one touch ultra 2 misc)    Sig: by Misc.(Non-Drug; Combo Route) route.  . Amlodipine  (norvasc ) 5 mg po tabs    Sig: Take 1 Tab by Mouth Once a Day.    Dispense:  30 Tab    Refill:  1    Side effects of prescribed medicine was discussed. Orders:  Orders Placed This Encounter  Procedure  . Systolic bp <130 mm hg  . Diastolic bp <80 mm hg  . Hgba1c<7.0% if w/in last 12 mos  . Ldl<100 mg/dl if w/in last 12 mos  . Diabetic foot exam  . Immunization adm, single or combo vaccine-flu [id number]  . Flu vaccine > 63 yrs old      SUBJECTIVE:   Chief Complaint  Patient presents with  . MEDICAL PROBLEM RE-EVALUATION     T2DM, HTN, & HPL follow up  . REFILL REQUEST    SEE PENDING ORDERS  . IMM/INJ    Flu Shot   63y/o AAF Brought bp monitor-wrist for correlation.  FBS this AM 186, sometimes 150 Still taking sugar pills when feels jittery, weak-no assd diaphoresis though reports less than previously.  Has not yet decreased metformin from 1gm AM/500mg  PM to 500mg  bid.  REFILL REQUEST Pertinent negatives include no abdominal pain, chest pain, chills, coughing, fever, headaches or nausea.  IMM/INJ Pertinent negatives include no abdominal pain, chest pain, chills, coughing, fever, headaches or nausea.     Review of Systems  Constitutional: Negative for fever, chills and malaise/fatigue.  Eyes: Negative for double vision and pain.  Respiratory: Negative for cough and shortness of breath.   Cardiovascular: Negative for chest pain, palpitations, leg swelling and PND.       Walked 2.63miles yesterday  Gastrointestinal: Negative for heartburn, nausea and abdominal pain.  Genitourinary: Negative for dysuria and frequency.  Neurological: Negative for headaches.    OBJECTIVE   There were no vitals taken for this visit.  BMI: Data Unavailable   Physical Exam  Nursing note and vitals reviewed. Constitutional: She is oriented to person, place, and time and well-developed, well-nourished, and in no distress.  HENT:  Head: Normocephalic and atraumatic.  Eyes: Conjunctivae are normal.  Cardiovascular: Normal rate and regular rhythm.  Exam reveals no gallop and no friction rub.   No murmur heard.      No carotid bruit  Pulmonary/Chest: Effort normal and breath sounds normal. She has no wheezes. She has no rales.  Musculoskeletal: She exhibits no edema.  Neurological: She is alert and oriented to person, place, and time. Gait normal.  Skin: Skin is warm and dry.  Psychiatric: Mood and affect normal.

## 2009-03-23 NOTE — Progress Notes (Signed)
 The Side Effects and Adverse Reactions of the influenza vaccine, described below, were discussed with the patient prior to the administration of the vaccine.     The influenza vaccine contains inactivated viruses and cannot c a u s e i n f l u e n z a . The most frequent side effect of the vaccine is soreness at the site of the injection, which may last up to two days. Other side effects include:  * Fever, malaise (tiredness) and myalgia (muscle aches); these may begin 6 to 12 hours after the injection and can be present for one to two days. * Immediate allergic reactions such as hives, asthma, angioedema (swelling of various parts of the body) or anaphylaxis (sudden shock like state) presumably to egg protein, occur rarely.  The inactivated Influenza vaccine would not be given to: (1) any person with known hypersensitivity to eggs/egg products; (2) any person with an acute febrile illness until the fever and symptoms have abated; (3) individuals known to be sensitive to Thimerosal;  The patient's flulaval vaccine was administered after obtaining consent and checking for contraindications.  The patient had no adverse effects from the vaccine. See the Immunization Activity for the immunization/injection specific information.  Heron LITTIE Homes

## 2009-05-12 NOTE — ED Provider Notes (Signed)
 [facility]   1. Neck pain (723.1B)    left  2. Epistaxis (784.7)      .  Assessment/Differential Diagnosis:  63 yo female with headaches, left neck pain, nosebleed this am, ddx tension, doubt migraine, radiculopathy, vascular disease, doubt cva or bleeding diathesis (recent weather change, dry environment in house and taking NSAID's), doubt acs.  ED Course/Medical Decision Making:  Normal exam in ED, do not think needs labs.  Plan CTH and cspine.  CT's neg for acute findings, advised patient of previous infarct, age indeterminate, and other ct findings, suspect this is musculoskeletal pain, plan discharge with prn valium, has follow up with pcp next week, advised of s/sx stroke.  Disposition:  Home  New Prescriptions   No medications on file     Chief Complaint  Patient presents with  . HEADACHE  . EPISTAXIS  . LEG PROBLEM     HPI Comments: 63 yo female presents to ED with left neck pain and nosebleed, worried about a stroke.  Patient has had intermittent headaches on the top of her head for weeks, associated with starting new bp med, which improved with changing of medication.  For past few weeks also has been having pain left side of neck, over SCM area.  Hurts to turn head to right and left.  Was worse, has been taking excedrin, aleve, motrin and using warm compressess.  This am when stood up to get out of bed, nose started bleeding, there was a lot of blood, took 10 minutes of pressure and dabbing to stop bleed.  Has had nosebleeds before, but never this bad.  Was worried that with neck pain, pain left calf, that she might be having a stroke.  Recently has felt like something pushing into her left calf, over past few days, but not present now.   No vision changes, no trouble speaking or swallowing, no numbness or weakness in the arms or legs, no trouble breathing or chest pain.  HEADACHE  Pertinent negatives include no fever, no  palpitations, no shortness of breath, no nausea and no vomiting.  EPISTAXIS   LEG PROBLEM  Pertinent negatives include no numbness.    Review of Systems  Constitutional: Negative for fever, chills, diaphoresis, appetite change and fatigue.  HENT: Positive for nosebleeds. Negative for congestion, sore throat, rhinorrhea, trouble swallowing, neck pain and neck stiffness.   Eyes: Negative for photophobia, pain, discharge, redness, itching and visual disturbance.  Respiratory: Negative for cough, chest tightness, shortness of breath and wheezing.   Cardiovascular: Negative for chest pain, palpitations and leg swelling.  Gastrointestinal: Negative for nausea, vomiting, abdominal pain, diarrhea, constipation and blood in stool.  Genitourinary: Negative for dysuria, urgency, frequency, hematuria, flank pain, decreased urine volume and difficulty urinating.  Musculoskeletal: Positive for myalgias. Negative for back pain, joint swelling, arthralgias and gait problem.  Skin: Negative for rash, color change and pallor.  Neurological: Positive for headaches. Negative for dizziness, tremors, seizures, syncope, speech difficulty, weakness, light-headedness and numbness.  Hematological: Does not bruise/bleed easily.  Psychiatric/Behavioral: Negative.     Past Medical History  Diagnosis Date  . Osteopenia   . Hypercholesteremia   . Hypertension   . Diabetes mellitus      Past Surgical History  Procedure Date  . Colon resection '00  . Hx hernia repair 2002  . Hx lysis of adhesions '01  . Hx partial hysterectomy 1989    for uterine fibroids  . Hx other 2010  Bone removal in right wirst     Family History  Problem Relation Age of Onset  . Alcohol abuse Father   . Cancer, Prostate Father   . Hypertension Father   . Asthma Mother   . Hypertension Mother   . High Cholesterol Mother   . Diabetes Sister   . Hypertension Sister   . High Cholesterol Sister   . Alcohol abuse Brother   .  Cancer, Prostate Brother   . Hypertension Brother   . High Cholesterol Brother   . Diabetes Maternal Aunt   . Diabetes Paternal Aunt   . Diabetes Maternal Uncle   . Diabetes Paternal Uncle   . Heart Disease Paternal Grandmother   . Alcohol abuse Maternal Grandfather   . Alcohol abuse Paternal Grandfather      History  Social History  . Marital Status: Married    Spouse Name: N/A    Number of Children: N/A  . Years of Education: N/A  Occupational History  . Not on file.  Social History Main Topics  . Tobacco Use: Never  . Alcohol Use: No  . Drug Use: No  . Sexually Active: Yes -- Female partner(s)  Other Topics Concern  . Caffeine Concern No  . Exercise No  . Seat Belt Yes  . Self-exams No  Social History Narrative  . No narrative on file     Outpatient prescriptions marked as taking for the 05/12/09 encounter [facility] Encounter)  Medication Sig Dispense Refill  . triamcinolone acetonide (KENALOG) 0.1 % TP CREA Use 1 Application to affected area 2 Times Daily As Needed.  60 g  1  . Blood Sugar Diagnostic (ONE TOUCH ULTRA TEST) Misc STRP 1 Each by Misc.(Non-Drug; Combo Route) route. Use to test blood sugars 2-3 times daily  100 Strip  11  . cloNIDine (CLONIDINE) 0.1 mg PO TABS Take 1 Tab by Mouth Twice Daily.  60 Tab  1  . OMEGA-3 FATTY ACIDS/VITAMIN E (OMEGA-3 FISH OIL PO) Take  by Mouth.      . IBUPROFEN PO Take  by Mouth.      . metformin (GLUCOPHAGE) 500 mg PO TABS Take 1 Tab by Mouth See Admin Instrs. 2 TABS AM, 1 tab PM  180 Tab  1  . glipiZIDE XL (GLUCOTROL XL) 10 mg PO TR24 Take 1 Tab by Mouth Once a Day.  30 Tab  3  . lisinopril  (PRINIVIL ) 40 mg PO TABS Take 1 Tab by Mouth Once a Day.  90 Tab  1  . indapamide (LOZOL) 2.5 mg PO TABS Take 1 Tab by Mouth Once a Day.  90 Tab  1  . simvastatin (ZOCOR) 20 mg PO TABS Take 1 Tab by Mouth Every Night at Bedtime.  90 Tab  1  . potassium chloride SR (KLOR-CON M20) 20 mEq PO TbTQ Take 1 Tab by Mouth Once a Day.  90 Tab  1   . BLOOD-GLUCOSE METER (ONE TOUCH ULTRA 2 MISC) by Misc.(Non-Drug; Combo Route) route.      . Multivitamin PO CAPS Take by Mouth Once a Day.       [name] atenolol (TENORMIN) 25 mg PO TABS Take 1 Tab by Mouth Once a Day.   30  3  . Aspirin 81 mg PO TABS Take 81 mg by Mouth.       . Glucosam-Chond-Hrb 149-Hyal Ac (GLUCOS CHOND CPLX ADVANCED) 750-100-125 mg PO TABS Take by Mouth.       . calcium carbonate (CALCIUM WITH VITAMIN  D) 600-400 mg-unit PO TABS Take 2 Tabs by Mouth Once a Day.          No Known Allergies  Vital Signs: Patient Vital Signs in the past 72 hrs:  Temp Heart Rate Resp BP BP Mean SpO2  05/12/09 1023 - 60  16  115/67 mmHg 83 MM HG -  05/12/09 0914 97.2 F (36.2 C) 92  18  115/72 mmHg 86 MM HG 98 %     Physical Exam  Constitutional: She is oriented to person, place, and time and well-developed, well-nourished, and in no distress. No distress.  HENT:  Head: Normocephalic and atraumatic.  Nose: Nose normal.  Mouth/Throat: Oropharynx is clear and moist. No oropharyngeal exudate.  Eyes: Conjunctivae and extraocular motions are normal. Pupils are equal, round, and reactive to light. Right eye exhibits no discharge. Left eye exhibits no discharge. No scleral icterus.  Neck: Normal range of motion. Neck supple. No JVD present. No tracheal deviation present.  Cardiovascular: Normal rate, regular rhythm, normal heart sounds and intact distal pulses.  Exam reveals no gallop and no friction rub.   No murmur heard. Pulmonary/Chest: Effort normal and breath sounds normal. No respiratory distress. She has no wheezes. She has no rales. She exhibits no tenderness.  Musculoskeletal: Normal range of motion. She exhibits no edema and no tenderness.  Lymphadenopathy:    She has no cervical adenopathy.  Neurological: She is alert and oriented to person, place, and time. No cranial nerve deficit. Gait normal. GCS score is 15.  Skin: Skin is warm and dry. No rash noted. She is not diaphoretic.  No erythema. No pallor.  Psychiatric: Mood, affect and judgment normal.    Documentation Review:  Initial ED Provider Note , Old medical records, Nursing notes  Diagnostics: Labs:  No results found for this visit on 05/12/09. ECG: none Rhythm interpretation from monitor: N/A CT HEAD W/O CONTRAST   Final Result:      CT CERVICAL SPINE W/O CONTRAST   Final Result:         Chest X-Ray: none CTH: -------------------------------------------------- Impression:  1. No evidence for intracranial hemorrhage, mass effect or midline shift. 2. Minimal periventricular areas of microvascular ischemic change.  3. Cerebral atherosclerosis. 4. Atrophy. 5. Mild paranasal sinus disease. 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  If there are continued symptoms, a MRI is recommended for further evaluation.  -------------------------------------------------- Diagnostic Interpretation:  CT of the brain without contrast. CPT: 70450.  Reason for exam: Headache. Comparison: None. DPL: 1429.6.   Axial computed tomography scan was performed from the cranial vertex to the skull base.  Minimal periventricular areas of decreased attenuation are identified. Gray-Atkins matter interfaces are preserved. A small left posterior internal capsule lacunar infarct is noted. No intraparenchymal hemorrhage, mass effect or midline shift. The ventricular system is midline and symmetric. There is no evidence for extra-axial mass lesion or fluid collection. Atherosclerotic vascular calcifications are identified.  Review of bone algorithm does not demonstrate evidence for fracture, lytic, or blastic lesion. There is mild ethmoid air cell mucosal thickening. Orbits are grossly intact. Facial soft tissue are within normal limits.  _  Dictated by: Delores, M.D., Viviann KANDICE [facility] Transcribed on: 12-May-2009 10:05 Finalized on:  12-May-2009 10:08 Finalized  by: Delores, M.D., Viviann KANDICE [facility]           ** FINAL REPORT **  CT cspine: -------------------------------------------------- Impression:  1. No evidence for fracture  or subluxation. 2. Multilevel degenerative change. MRI is recommended if there are continued symptoms.  -------------------------------------------------- Diagnostic Interpretation:  Reason for exam: Radiculopathy. CPT code: 27874.   Comparison: None. DPL: 1429.6.  Axial computed tomography scan was performed of the cervical spine. Reformatted images in both sagittal and coronal planes were utilized. Images were evaluated with both soft tissue and bone windows.  No evidence for fracture or subluxation. There is preservation of vertebral body height. Endplate degenerative changes are noted at levels C3 through C6. Prominent posterior endplate osteophyte at level C5-C6 causes moderate canal stenosis. There is narrowing of disc spaces at level C3-C4 through C5-C6. Facet degenerative changes are noted at multiple levels. Prevertebral soft tissues are unremarkable. C1-C2 articulation and the odontoid process are intact. The cervicothoracic junction is contiguous.   Lung apices are clear.  _  Dictated by: Delores, M.D., Viviann KANDICE [facility] Transcribed on: 12-May-2009 10:10 Finalized on:  12-May-2009 10:17 Finalized by: Delores, M.D., Viviann KANDICE [facility]           ** FINAL REPORT **

## 2009-08-05 DIAGNOSIS — I517 Cardiomegaly: Secondary | ICD-10-CM | POA: Insufficient documentation

## 2009-09-22 NOTE — Progress Notes (Signed)
 ASSESSMENT:   1-HTN Wrist machine had correlated at 1/4 visit.  Home bps acceptable 12/09 ekg 2/'11 cbc, cmp; 11/'10 m/c  2-T2DM BS suboptimal on metformin 1gm; glipizide 15mg , januvia 100mg  HEMOGLOBIN A1C  Date Value Range Status  07/29/2009 7.8*; tsh, eye exam 4.8-5.9 (%) Final  7/'10 foot exam 11/'10 m/c On plavix/ace/statin Reviewed w/pt that can increase metformin to 1500mg  daily.  Would likely not recommend increasing glipizide to 20mg  (increasing risk of hypoglycemia).  Insulin  a reasonable option at this point and recommended that pt consider it.  Would then be able to stop glipizide and ?januvia as well. -for now, increase metformin to 1500mg  daily -45mo f/u w/hgba1c at that visit  3-cervical DJD See 1/4 note for review.  SHe still hasn't tried gabapentin but did ask this visit if could use prn (replied affirmative).  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  Pt already has a podiatrist should conservative therapy no provide relief  6-osteopenia/vit D def  Lab Results   Component  Value  Date    VITD25OH  21.3*  07/29/2009   Last year vit D wnl, pt hasn't taken usual regimen (orders it via mail) for a few months, likely reason for reduced level. Her supplement has 500IU of D and 1gm of calcium per 3 pills and pt taking 5.  Reviewed DEXA 1/'11 showing spine=-1.2-c/w osteopenia  7-H.M.  Pt though that still has uterus though on 2/22 exam did not appreciate (and s/p hysterectomy?).  07/2009 mammogram  PLAN:   Discussed evaluation, treatment and usual course. Patient verbalizes understanding and agrees with plan of care. All questions were answered. Medication Orders this Encounter   Medication  . Metformin (glucophage) 500 mg po tabs    Sig: Take 3 Tabs by Mouth 3 Times Daily.    Side effects of prescribed medicine was discussed. Orders:  No orders of the defined types were placed in this encounter.     SUBJECTIVE:   Chief Complaint  Patient presents with  . MEDICAL PROBLEM RE-EVALUATION    in today for T2DM, HTN, and HPL. Has noticed her sugars are higher in the am.   See EMR for BS/BP log Saw DR Tessa 3/10-good for another year HPI: Ms. Theresa Mendez is a 64 y.o AAF in today for T2DM, HTN, and HPL. Has noticed her sugars are higher in the am.   Review of Systems  Constitutional: Negative for fever and chills.  Respiratory: Negative for cough and shortness of breath.   Cardiovascular: Negative for chest pain and leg swelling.  Gastrointestinal: Negative for heartburn, nausea, abdominal pain and diarrhea.  Genitourinary: Negative for dysuria and frequency.  Musculoskeletal:       Right heel pain since Feb 2011-worse first thing in AM-with wt bearing.  Neurological: Negative for headaches.    OBJECTIVE   BP 140/90  Pulse 68  Resp 16  Wt 201 lb 12.8 oz (91.536 kg)  BMI: Data Unavailable   Physical Exam  Nursing note and vitals reviewed. Constitutional: She is oriented to person, place, and time and well-developed, well-nourished, and in no distress.  HENT:  Head: Normocephalic and atraumatic.  Eyes: Conjunctivae are normal.  Neck: Neck supple. No thyromegaly  present.  Cardiovascular: Normal rate and regular rhythm.  Exam reveals no gallop and no friction rub.   No murmur heard.      No carotid bruit  Pulmonary/Chest: Effort normal and breath sounds normal. She has no wheezes. She has no rales.  Abdominal: No tenderness. She has no rebound and no guarding.  Musculoskeletal: She exhibits no edema.       No right heel TTP Right DP 2+  Lymphadenopathy:    She has no cervical adenopathy.  Neurological: She is alert and oriented to person, place, and  time. Gait normal.  Skin: Skin is warm and dry.  Psychiatric: Mood and affect normal.

## 2010-07-12 NOTE — Progress Notes (Signed)
 ASSESSMENT:   1-T2DM lantus started at 12/29 visit.  Pt does feel better. 9/'11 foot exam; 11/'11 m/c; 2/'11 tsh On plavix/ace/statin -increase lantus to 30units -add apidra sliding scale as pt to start medrol pak-given written script  2-HTN Tolerating bystolic 5mg  daily however is $50 co pay.  Pt is interested in trying metoprolol  in lieu of 12/'11 bmp, cbc; 9/'11 m/c -d/c bystolic -metoprolol  25mg  1/2 bid -f/u 3-4 wks  3-HPL zocor 20mg  Lab Results  Component Value Date/Time   CHOLESTEROL 151 06/08/2010  9:15 AM   HDL 45 06/08/2010  9:15 AM   LDLIPO 84 06/08/2010  9:15 AM   TRIGLYCERIDE 108 06/08/2010  9:15 AM  Meeting LDL goal<100  4-osteopenia Lab Results  Component Value Date   VITD25OH 32.7 03/03/2010  Her current supplement has 500IU of D and 1gm of calcium per 3 pills-pt taking 5 pills  5-H.M. Scheduled for mammogram 2/24  PLAN:   Discussed evaluation, treatment and usual course. Patient verbalizes understanding and agrees with plan of care. All questions were answered. Medication Orders this Encounter  Medication  . Metoprolol  (lopressor ) 25 mg po tabs    Sig: Take 0.5 Tabs by Mouth Twice Daily.    Dispense:  30 Tab    Refill:  5  . Insulin  glulisine (apidra solostar) 100 unit/ml [place]    Sig: Inject  beneath the skin. Sugars qAC and qhs If sugar 200-250 then 3units; 251-300 then 6 units, 301-350 then 9 units, >350 then 12units    Side effects of prescribed medicine was discussed.   SUBJECTIVE:   Chief Complaint  Patient presents with  . DIABETES  . HYPERTENSION  . HYPERLIPIDEMIA  . OSTEOPENIA    65y/o AAMF brought BS log reflecting 20units lantus.  Has seen Dr Leonce for neck pain-prescribed medrol pak which she hasn't yet started.  She reports that bp at Dr Keller was 124/82 0530 254, 299, 238, 260, 238, 277, 259  DIABETES Pertinent negatives include no chest pain, chills, coughing, fever or headaches.  HYPERTENSION Pertinent negatives  include no chest pain, headaches or shortness of breath.  HYPERLIPIDEMIA Pertinent negatives include no chest pain, chills, coughing, fever or headaches.  OSTEOPENIA Pertinent negatives include no chest pain, chills, coughing, fever or headaches.     Review of Systems  Constitutional: Negative for fever and chills.  Respiratory: Negative for cough and shortness of breath.   Cardiovascular: Negative for chest pain.  Gastrointestinal:       Saw Dr Abron.  Colonoscopy 2/9 Right sided pain a little better especially when has a BM  Genitourinary: Negative for dysuria and frequency.  Neurological: Negative for headaches.    OBJECTIVE   BP 142/78  Pulse 68  Resp 16  Ht 5' 2 (1.575 m)  Wt 194 lb (87.998 kg)  BMI 35.48 kg/m2  BMI: 35.47   Physical Exam  Nursing note and vitals reviewed. Constitutional: She is oriented to person, place, and time and well-developed, well-nourished, and in no distress.  HENT:  Head: Normocephalic and atraumatic.  Eyes: Conjunctivae are normal.  Neck: Neck supple. No thyromegaly present.  Cardiovascular: Normal rate and regular rhythm.  Exam reveals no gallop and no friction rub.   No murmur heard.      No carotid bruit  Pulmonary/Chest: Effort normal and breath sounds normal. She has no wheezes. She has no rales.  Musculoskeletal: She exhibits no edema.  Lymphadenopathy:    She has no cervical adenopathy.  Neurological: She is alert and oriented to  person, place, and time.  Skin: Skin is warm and dry.  Psychiatric: Mood and affect normal.

## 2011-08-17 NOTE — ED Provider Notes (Signed)
 ED KNEE INJURY NOTE  [facility]   1. Injury, other and unspecified, knee, leg, ankle, and foot (959.7)   2. Sprain and strain of unspecified site of knee and leg (844.9)     Assessment: History of osteopenia. Minor truama with twist of knee. probable knee sprain/strain. Will evaluate for fracture with radiographs.  Plan:  obtain plain films and manage pain. Films reassuring.  Immobilization:  Knee immobilizer, crutches. Weight bear as tolerated. OTC pain control. Follow up with orthopedics and PCP.  Disposition:  Home  New Prescriptions   No medications on file     Chief Complaint:  Knee Injury  HPI:  66 y.o. female who injured her Left knee today.  Pain is moderate.  Mechanism was twist.  Patient able to partially weight bear.  Patient noted no swelling. Pt was walking down a flight of stairs while carrying weight. ablet o ambulate afterwards but using assistance with her husbans cain  ROS:  Patient denies foot pain, bruising or bleeding problems, other joint pain, fever and loss of sensation or function.  Past/Family/Social History:  Patient has no previous significant injury to this knee. Past Medical History  Diagnosis Date  . Osteopenia   . Hypercholesteremia   . Hypertension   . Diabetes mellitus   . Back pain   . Knee pain     Outpatient prescriptions marked as taking for the 08/17/11 encounter [facility] Encounter)  Medication Sig Dispense Refill  . lansoprazole (PREVACID) 30 mg PO CPDR Take 1 Cap by Mouth Once a Day.  90 Cap  1  . simvastatin (ZOCOR) 20 mg PO TABS Take 1.5 Tabs by Mouth Every Night at Bedtime.  135 Tab  1  . lisinopril  (PRINIVIL ) 40 mg PO TABS Take 1 Tab by Mouth Once a Day.  90 Tab  1  . indapamide (LOZOL) 2.5 mg PO TABS Take 1 Tab by Mouth Once a Day.  90 Tab  1  . potassium chloride (KLOR-CON) 20 mEq PO PACK Take 1 Packet by Mouth Once a Day.  90 Packet  1  . ergocalciferol (ERGOCALCIFEROL) 50,000 unit PO CAPS  Take 1 Cap by Mouth See Admin Instrs. 1 cap twice a month (1st and 15th)  CLARIFICATION of prescription sent December 21st  2 Cap  11  . gabapentin (NEURONTIN) 300 mg PO CAPS Take 1 Cap by Mouth 3 Times Daily.  90 Cap  3  . Blood Sugar Diagnostic (ONE TOUCH ULTRA TEST) Misc STRP 1 Each by Misc.(Non-Drug; Combo Route) route. Use to test blood sugars 2-3 times daily  100 Strip  5  . metoprolol  (LOPRESSOR ) 50 mg PO TABS Take 1 Tab by Mouth Twice Daily.  60 Tab  5  . metFORMIN (GLUCOPHAGE) 500 mg PO TABS Take 1 Tab by Mouth 3 Times Daily.  90 Tab  3  . clopidogrel (PLAVIX) 75 mg PO TABS Take 1 Tab by Mouth Once a Day.  30 Tab  5  . Omega-3 Fatty Acids-Vitamin E (FISH OIL) 1,000 mg PO CAPS Take 1 Cap by Mouth Twice Daily.      . Multivitamin PO CAPS Take by Mouth Once a Day.         No Known Allergies  Exam:  Vital Signs: Patient Vital Signs in the past 72 hrs:  Temp Heart Rate Resp BP BP Mean SpO2 Weight  08/17/11 1533 98.2 F (36.8 C) 75  18  172/80 mmHg 111 MM HG 97 % 79.379 kg (175 lb)  General:  Nontoxic alert and oriented. Knee Left Inspection:  normal.   Patient able to partially weight bear 4 steps in ED. Palpation:  Tenderness not present.  Joint ROM:  able to flex to 90 degrees. Knee Stress testing:  anterior drawer  negative, Varus testing  negative for lateral collateral pain and Valgus testing  negative for medial collateral pain. Medial pain on stressing. No locking or crepitus.  Neurovascular exam intact for perfusion and function.  Diagnostics: Knee X-Ray:  normal bony anatomy.    Knee Rules  [place] knee rules: Age 41 or older Tenderness at head of fibula Isolated tenderness of patella Inability to flex knee to 90 degrees Inability to walk four weight-bearing steps immediately after the injury and in the emergency department   [facility] decision rules: Blunt trauma or a fall as mechanism of injury plus either of the following: Age younger than 12 years or older  than 50 years Inability to walk four weight bearing steps in the emergency department

## 2011-10-05 NOTE — H&P (Signed)
 Endoscopy Pre Procedural Patient History and Evaluation  October 05, 2011 7:45 AM Theresa Mendez is a 66 y.o. female             ERE:Gnjw [name], MD Procedure: Colonoscopy  for Personal history of polyps. No Known Allergies Current Medications: No outpatient prescriptions have been marked as taking for the 10/05/11 encounter [facility] Encounter) with 1, [facility] Rm.   Home medications were reviewed with patient: Yes History: Past Medical History  Diagnosis Date  . Osteopenia   . Hypercholesteremia   . Hypertension   . Diabetes mellitus   . Back pain   . Knee pain    Past Surgical History  Procedure Laterality Date  . Colon resection  '00  . Hernia repair  2002  . Lysis of adhesions  '01  . Partial hysterectomy  1989    for uterine fibroids  . Other  2010    Bone removal in right wirst   Family History  Problem Relation Age of Onset  . Alcohol abuse Father   . Cancer, Prostate Father   . Hypertension Father   . Asthma Mother   . Hypertension Mother   . High Cholesterol Mother   . Stroke Mother   . Diabetes Sister   . Hypertension Sister   . High Cholesterol Sister   . Alcohol abuse Brother   . Cancer, Prostate Brother   . Hypertension Brother   . High Cholesterol Brother   . Diabetes Maternal Aunt   . Cancer Maternal Aunt     Lung Cancer- Mom's half sister Share Father  . Diabetes Paternal Aunt   . Diabetes Maternal Uncle   . Diabetes Paternal Uncle   . Heart Disease Paternal Grandmother   . Alcohol abuse Maternal Grandfather   . Alcohol abuse Paternal Grandfather    History   Social History  . Marital Status: Married    Spouse Name: N/A    Number of Children: N/A  . Years of Education: N/A   Occupational History  . Not on file.   Social History Main Topics  . Smoking status: Former Games developer  . Smokeless tobacco: Never Used   Comment: quit 1990's  . Alcohol Use: No  . Drug Use: No  . Sexually Active: Yes -- Female partner(s)   Other Topics  Concern  . Caffeine Concern No  . Exercise No  . Seat Belt Yes  . Self-Exams No   Social History Narrative  . No narrative on file   Physical Exam: Ht 5' 3.5 (1.613 m)  Wt 82.271 kg (181 lb 6 oz)  BMI 31.62 kg/m2 Heart:  normal S1 and S2 Lungs: clear Abdomen:soft, nontender, normal bowel sounds Mental Status:awake and alert; oriented to person, place, and time Other physical exam comments:  Impression: Patient is an appropriate candidate for sedation. Plan: 1. Proceed to scheduled procedure. 2. Consent has been obtained.   PHEBE Waddell Marshall, MD, MD 7:45 AM

## 2012-09-06 NOTE — Progress Notes (Signed)
 Quick Note:  Left message for her to call us  back ______

## 2012-09-06 NOTE — Progress Notes (Signed)
 Quick Note:  Called pt to discuss results of brain MRI (significant atrophy). I saw this pt at [facility].  Pt does not have follow up with Sonny Bari until June.  Would like to give her and her husband opportunity to come to [place] to see me and discuss her MRI findings and diagnosis in person. ______

## 2012-09-13 NOTE — Progress Notes (Signed)
 [facility] [address] [place], [place] [address] Telephone: [phone] Fax: [phone]  Out-Patient Follow-Up Visit   Patient Name: Theresa Mendez DOB: 03-26-1946 MRN:  [id number]  Sonny A. Tonny, MPA, PA-C  09/13/2012    PCP:  Candis [name], MD   Follow up for MEMORY LOSS and HEADACHE RE-EVALUATION   IMPRESSION:     ICD-9-CM  1. Dementia 294.20  2. 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 Leonce, for her cervical spine problems.  Her neck and head pain seem relatively well controlled at this time.  Will avoid new medications for that. Continue Aricept  5 mg for now, will likely increase next visit Other medications per primary Referral to neuropsychology for formal memory testing; history sounds like more of an Alzheimer's picture, but with MRI findings, will get formal testing MMA not in the system; will try to get records from where she had it done.  Reviewed MRI findings and reviewed the different types of dementia profiles. Her blood pressure is elevated today; she denies any current headache, vision changes, chest pain, or SOB.  She will recheck when she gets home and review with her PCP.   RTC after above The patient is to continue to follow with his/her PCP for all other health conditions. The patient indicates understanding of these issues and agrees to the plan.   INTERVAL HISTORY:   History of Present Illness: Theresa Mendez is a 67 y.o. right handed African American female.   She is here for follow-up of memory problem and headache.  She has cervical and lumbar spine problems, for which she follows with Dr. Leonce.  She gets neck and posterior head pain occasionally, for which she uses Flexeril or tylenol .  She tries to use these sparingly. She denies any nausea, photophobia, or phonophobia.  She has gradually worsening ST memory issues including  repetition and difficulty with work.  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  She denies any slurred speech or atypical speech patterns but will sometimes have some word finding issues.  She denies any major behavioral problems, but she does get angry more easily.  She has always been an insomniac, and she denies any worsening.  She does admit to some depression d/t her memory issue, but she denies any SI/HI. She does not feel that her depression came before the memory problem.  Her blood pressure is elevated today. She says it has been this high for a while, and her PCP did not change any medications.  She notes that one of her parents and several of her siblings have dementia.  Review of Interval Testing: MRI brain, personally viewed, as read by Dr. Catherne on 09/03/2012:  Impression:     1. Extensive cerebral volume loss, more prominent in the frontal, parietal and temporal lobes as described above.  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    2. No acute intracranial hemorrhage. No acute infarct.    Clinical correlation is recommended.    CBC Lab Results  Component Value Date/Time   WBC 6.6 04/09/2012  9:01 AM   RBC 4.02 04/09/2012  9:01 AM   HEMOGLOBIN 12.0 04/09/2012  9:01 AM   HCT 36.0 04/09/2012  9:01 AM   MCV 90 04/09/2012  9:01 AM   MCH 30 04/09/2012  9:01 AM   MCHC 33 04/09/2012  9:01 AM   RDW 12.4 04/09/2012  9:01 AM   PLATELET 288 04/09/2012  9:01 AM   MPV 10.6 04/09/2012  9:01 AM   Comprehensive Metabolic Profile Lab Results  Component Value Date/Time   NA 144 07/16/2012  8:23 AM   POTASSIUM 3.9 07/16/2012  8:23 AM   CHLORIDE 105 07/16/2012  8:23 AM   CO2 25 07/16/2012  8:23 AM   ANIONGAP 14.5 07/16/2012  8:23 AM   BUN 13 07/16/2012  8:23 AM   CREAT 0.9 09/03/2012 12:46 PM   CREAT 0.7* 07/16/2012  8:23 AM   GLUCOSE 168* 07/16/2012  8:23 AM   CALCIUM 9.4 07/16/2012  8:23 AM   ALBUMIN 4.3  03/03/2010  8:07 AM   TOTPR 7.2 03/03/2010  8:07 AM   ALKPHOS 81 03/03/2010  8:07 AM   SGOTAST 17 07/16/2012  8:23 AM   SGPTALT 17 07/16/2012  8:23 AM   BILIT 0.2 03/03/2010  8:07 AM   Thyroid Studies   Lab Results  Component Value Date/Time   TSH 1.83 02/22/2012  8:26 AM   No results found for this basename: T3, FREET3, T3UPTAKE, THYROXINBDGL,       Lab Results  Component Value Date/Time   CHOLESTEROL 139 07/16/2012  8:23 AM   HDL 48 07/16/2012  8:23 AM   LDLIPO 69 07/16/2012  8:23 AM   TRIGLYCERIDE 114 07/16/2012  8:23 AM     Current Outpatient Prescriptions  Medication Sig Dispense Refill  . donepezil  (ARICEPT ) 5 mg PO TABS Take 1 Tab by Mouth Once a Day. One tab PO qhs for one month, then increase to 2 tabs po qhs  60 Tab  2  . spironolactone (ALDACTONE) 25 mg PO TABS Take 0.5 Tabs by Mouth Once a Day.  30 Tab  1  . clopidogrel (PLAVIX) 75 mg PO TABS Take 1 Tab by Mouth Once a Day.  30 Tab  5  . traZODone (DESYREL) 50 mg PO TABS Take 1 Tab by Mouth Every Night at Bedtime. Take 1-2 tablets at bedtime for sleep  60 Tab  5  . TYPE-IN MEDICATION 1 Dose by Misc.(Non-Drug; Combo Route) route As Directed. Glucometer  1 Each  0  . Blood Sugar Diagnostic (BLOOD GLUCOSE TEST) Misc STRP 1 Each as directed As Directed.  1 Bottle  11  . metoprolol  (LOPRESSOR ) 100 mg PO TABS Take 1 Tab by Mouth Twice Daily.  60 Tab  1  . metFORMIN (GLUCOPHAGE) 500 mg PO TABS Take 1 Tab by Mouth 2 Times Daily with Meals.  180 Tab  0  . atorvastatin (LIPITOR) 10 mg PO TABS Take 1 Tab by Mouth Every Night at Bedtime.  30 Tab  5  . ergocalciferol (VITAMIN D2) 50,000 unit PO CAPS Take 1 Cap by Mouth See Admin Instrs. 1 cap twice a month (1st and 15th)  CLARIFICATION of prescription sent December 21st  2 Cap  11  . cyclobenzaprine (FLEXERIL) 10 mg PO TABS Take 5 mg by Mouth Every 8 Hours As Needed.      . lisinopril  (PRINIVIL ) 40 mg PO TABS Take 1 Tab by Mouth Once a Day.  90 Tab  1  . lansoprazole (PREVACID) 30 mg PO CPDR  Take 1 Cap by Mouth Once a Day.  90 Cap  3  . CALCIUM CARBONATE (CALCIUM 300 PO) Take  by Mouth.      . Blood Sugar Diagnostic (ONE TOUCH ULTRA TEST) Misc STRP 1 Each by Misc.(Non-Drug; Combo Route) route. Use  to test blood sugars 2-3 times daily  100 Strip  5  . BLOOD-GLUCOSE METER (ONE TOUCH ULTRA 2 MISC) by Misc.(Non-Drug; Combo Route) route.      . Multivitamin PO CAPS Take by Mouth Once a Day.       . Glucosam-Chond-Hrb 149-Hyal Ac (GLUCOS CHOND CPLX ADVANCED) 750-100-125 mg PO TABS Take by Mouth.        No current facility-administered medications for this visit.   Allergies  Allergen Reactions  . Zocor (Simvastatin) muscle/joint pain   Past Medical History  Diagnosis Date  . Osteopenia   . Hypercholesteremia   . Hypertension   . Diabetes mellitus (HCC)   . Back pain   . Knee pain   . Colon polyp   . Hemorrhoid   . Esophageal reflux   . Piercing   . 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  Normal attention and concentration.  Normal fund of knowledge except for minor difficulty with PMH.  Speech is fluent and appropriate language function.  Cranial Nerves:   CN II:    Visual fields are full to confrontation testing. CN III, IV, VI:  Extraocular movements are intact and without nystagmus.     CN V:  Facial sensation in tact.  CN VII:  Facial movement is symmetric and of normal strength. CN VIII: Hearing is normal to whisper.   CN IX:  Palate movement in tact. CN XI:  Shoulder shrug strength normal. CN XII:  Tongue protrusion normal.  Motor:  Examination  shows normal strength with normal muscle tone in all four extremities.    Sensory: Examination is grossly intact to touch.   Coordination:  The patient has good coordination, normal manner of stance and gait function testing.   Sonny A. Tonny, MPA, PA-C   This document has been electronically signed by Sonny LABOR. Tonny, MPA, PA-C on 09/13/2012

## 2012-09-13 NOTE — Telephone Encounter (Signed)
 Please advise this patient that I received most recent labs, and they were not the lab that Dr. Rubin wanted. Please advise the patient to come in to the lab to have the methylmalonic acid lab done.  If she wants to go to her own facility, please fax the lab order there. Thank you.

## 2012-09-18 DIAGNOSIS — F0280 Dementia in other diseases classified elsewhere without behavioral disturbance: Secondary | ICD-10-CM | POA: Insufficient documentation

## 2012-09-18 NOTE — Progress Notes (Signed)
 ASSESSMENT:   1-accelerated HTN Vitals 06/15/2012 07/16/2012 08/09/2012 08/23/2012 09/13/2012 09/13/2012  BP 140/84 150/74 140/76 170/98 182/98 170/90   Vitals 09/18/2012  BP 192/98  Recent increase in bp again-occurred in the fall. Pt's bp monitor correlated today. Onset prior to aricept . Arms equal my check 178/100 Recent home sleep study negative -24 hr urine to r/o pheo -pvl renal -hydralazine 25mg  tid in addition to current aldactone 12.5mg , lisinopril  40mg , lopressor  100mg  bid -to call bps in 3 days -f/u in 2 wks PLAN:   Discussed evaluation, treatment and usual course. Patient verbalizes understanding and agrees with plan of care. All questions were answered. Medication Orders this Encounter  Medications  . hydrALAZINE (APRESOLINE) 25 mg PO TABS    Sig: Take 1 Tab by Mouth Once a Day.    Dispense:  90 Tab    Refill:  1   Side effects of prescribed medicine was discussed. Orders:   Orders Placed This Encounter  Procedures  . TSH  . VMA TIMED URINE  . CATECHOLAMINE FRACTION TIMED URINE  . METANEPHRINES FRACTION TIMED URINE  . PVL RENAL ARTERY/VEIN BILATERAL       SUBJECTIVE:   Chief Complaint  Patient presents with  . HYPERTENSION    BP ELEVATED PER PATIENT, DOES NOT FEEL GOOD  . DIABETES  . HYPERLIPIDEMIA   67y/o AAMF started checking bps as didn't feel well about 2wks ago.  Feels a tightness in her head and neck felt tight.  No chest pain, SOB, dizziness, nausea. Has been on aricept  since 3/13 Tylenol  for the past week as needed for headache. Denies taking NSAID or BCs BP this AM 198/114 HYPERTENSION Pertinent negatives include no blurred vision, orthopnea, PND or shortness of breath.  DIABETES Pertinent negatives include no chills, coughing or fever.  HYPERLIPIDEMIA Pertinent negatives include no chills, coughing or fever.     Review of Systems  Constitutional: Negative for fever and chills.  Eyes: Negative for blurred vision.  Respiratory: Negative  for cough and shortness of breath.   Cardiovascular: Negative for orthopnea, leg swelling and PND.     OBJECTIVE   BP 192/98  Pulse 78  Resp 18  Wt 84.823 kg (187 lb)  BMI 33.13 kg/m2 BMI: 33.13   Physical Exam  Nursing note and vitals reviewed. Constitutional: She is oriented to person, place, and time and well-developed, well-nourished, and in no distress.  HENT:  Head: Normocephalic and atraumatic.  Eyes: Conjunctivae are normal.  Cardiovascular: Normal rate and regular rhythm.  Exam reveals no gallop and no friction rub.   No murmur heard. No carotid bruit  Pulmonary/Chest: Effort normal and breath sounds normal. She has no wheezes. She has no rales.  Musculoskeletal: She exhibits no edema.  Neurological: She is alert and oriented to person, place, and time.  str 5/5 BUE/BLE  Skin: Skin is warm and dry.  Psychiatric: Mood and affect normal.

## 2012-09-20 NOTE — Telephone Encounter (Signed)
 Notified pt of lab results

## 2012-10-01 NOTE — Progress Notes (Signed)
 ASSESSMENT:   1-HTN Pt's monitor correlated 4/8 visit Confirmed that pt is taking aldactone 12.5mg , lisinopril  40mg , lopressor  100mg  bid Amlodipine  in past=palpitations/headache 04/2009 Arms equal 4/8 visit Home sleep study negative Wrote out to do 24 hour urine to r/o pheo; expect to hear from imaging about PVL scheduling renal artery -increase hydralazine to 50mg  tid -f/u on Thursday  T2DM  HEMOGLOBIN A1C   Date  Value  Range  Status   07/16/2012  7.0*  4.8 - 5.9 %  Final   Metformin 500mg  bid  5/'13 eye exam; 9/'13 foot exam; tsh, m/c   vitamin D def  Twice monthly high dose  Lab Results   Component  Value  Date    VITD25OH  39.6  07/16/2012    HPL  Lab Results   Component  Value  Date/Time    CHOLESTEROL  139  07/16/2012 8:23 AM    HDL  48  07/16/2012 8:23 AM    LDLIPO  69  07/16/2012 8:23 AM    TRIGLYCERIDE  114  07/16/2012 8:23 AM   lipitor 10mg    H.M. Need to remind about mammogram on rtc  PLAN:   Discussed evaluation, treatment and usual course. Patient verbalizes understanding and agrees with plan of care. All questions were answered. Medication Orders this Encounter  Medications  . DISCONTD: hydrALAZINE (APRESOLINE) 25 mg PO TABS    Sig: Take 1 Tab by Mouth Every 8 Hours.  . hydrALAzine (APRESOLINE) 50 mg PO TABS    Sig: Take 1 Tab by Mouth Every 8 Hours.    Dispense:  90 Tab    Refill:  1   Side effects of prescribed medicine was discussed.     SUBJECTIVE:   Chief Complaint  Patient presents with  . 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  I was treated like a pariah.  Pt was fired by her brother.  When asked what the point of contention was, I made some mistakes at work which cost him money.  Denies suicidal or homocidal ideation and reports generally feeling fine but going back today did seem to upset her greatly.  Did not  yet do 24hour urine collection as was confused about what to do.  FOLLOW UP-OFFICE VISIT Pertinent negatives include no chest pain, chills, coughing, fever or nausea.     Review of Systems  Constitutional: Negative for fever and chills.  HENT:       Feels pressure top of head today  Respiratory: Negative for cough and shortness of breath.   Cardiovascular: Negative for chest pain.  Gastrointestinal: Negative for nausea.  Neurological: Negative for dizziness.     OBJECTIVE   BP 180/90  Pulse 110  Temp(Src) 97.6 F (36.4 C)  Resp 14  Ht 5' 3 (1.6 m)  Wt 85.911 kg (189 lb 6.4 oz)  BMI 33.56 kg/m2 BMI: 33.56   Physical Exam  Nursing note and vitals reviewed. Constitutional: She is oriented to person, place, and time and well-developed, well-nourished, and in no distress.  tearful  HENT:  Head: Normocephalic and atraumatic.  Eyes: Conjunctivae are normal.  Cardiovascular: Regular rhythm.  Exam reveals no gallop and no friction rub.   No murmur heard.  tachycardic  Pulmonary/Chest: Effort normal and breath sounds normal. She has no wheezes. She has no rales.  Musculoskeletal: She exhibits no edema.  Neurological: She is alert and oriented to person, place, and time.  Skin: Skin is warm and dry.  Psychiatric: Affect normal.

## 2012-10-04 NOTE — Progress Notes (Signed)
 ASSESSMENT:   1-HTN My check 142/80 Pt's monitor correlated at 09/18/2012 visit Hydralazine 50mg  tid, aldactone 12.5mg , lisinopril  40mg , lopressor  100mg  bid Better control Home sleep study=negative Pt has to get more solution for 24 hr urine collection as she threw it out-still has the container Upcoming renal artery PVL Already has f/u 5/22 2/'14 bmp; 9/'13 m/c  T2DM  HEMOGLOBIN A1C   Date  Value  Range  Status   07/16/2012  7.0*  4.8 - 5.9 %  Final   Metformin 500mg  bid  5/'13 eye exam; 9/'13 foot exam; tsh, m/c   vitamin D def  Twice monthly high dose  Lab Results   Component  Value  Date    VITD25OH  39.6  07/16/2012    HPL  Lab Results   Component  Value  Date/Time    CHOLESTEROL  139  07/16/2012 8:23 AM    HDL  48  07/16/2012 8:23 AM    LDLIPO  69  07/16/2012 8:23 AM    TRIGLYCERIDE  114  07/16/2012 8:23 AM   lipitor 10mg    H.M. Needs to schedule mammogram PLAN:   Discussed evaluation, treatment and usual course. Patient verbalizes understanding and agrees with plan of care. All questions were answered.   SUBJECTIVE:   Chief Complaint  Patient presents with  . FOLLOW UP-OFFICE VISIT  66y/o AAMF  Has had occasional pressure top of head but not like before Here in f/u re: HTN This AM 155/90 P=83 10AM; 4:30PM 110/62 P=68  FOLLOW UP-OFFICE VISIT Pertinent negatives include no chest pain.     Review of Systems  Respiratory: Negative for shortness of breath.   Cardiovascular: Negative for chest pain, palpitations and leg swelling.  Neurological: Negative for dizziness.     OBJECTIVE   BP 140/80  Pulse 66  Resp 16  Ht 5' 3 (1.6 m)  Wt 85.73 kg (189 lb)  BMI 33.49 kg/m2 BMI: 33.49   Physical Exam  Nursing note and vitals reviewed. Constitutional: She is oriented to person, place, and time and well-developed, well-nourished, and in no distress.  Cardiovascular: Normal rate and regular rhythm.  Exam reveals no gallop and no friction rub.   No murmur  heard. Musculoskeletal: She exhibits no edema.  Neurological: She is alert and oriented to person, place, and time.  Psychiatric: Mood and affect normal.

## 2012-10-19 NOTE — Telephone Encounter (Signed)
 Left message that needs to buy pill box w/breakfast/lunch/dinner sections

## 2012-11-01 NOTE — Progress Notes (Signed)
 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 Generally gets mammograms at [facility]-pt to call to schedule   PLAN:   Discussed evaluation, treatment and usual course. Patient verbalizes understanding and agrees with plan of care. All questions were answered. Medication Orders this Encounter  Medications  . GABAPENTIN PO    Sig: Take 1 Cap by Mouth 3 Times Daily.  . hydrALAzine (APRESOLINE) 50 mg PO TABS    Sig: Take 1.5 Tabs by Mouth Every 8 Hours.    Dispense:  135 Tab    Refill:  1   Side effects of prescribed medicine was discussed. Orders:   Orders Placed This Encounter  Procedures  . VENIPUNCTURE  . SPECIMEN HANDLING FEE  . BASIC METABOLIC PANEL       SUBJECTIVE:   Chief Complaint  Patient presents with  . FOLLOW UP-OFFICE VISIT   66y/o AAMF in f/u re: HTN Didn't bring bp log-140s/70-80-occasional dbp '90' No headaches, chest pain, dizzy, swelling Brought pill box  And medication list.  Since being on aricept  less outbursts.  Seems to only occur in re: to family situations.  FOLLOW UP-OFFICE VISIT Pertinent negatives include no chills, coughing or fever.     Review of Systems  Constitutional: Negative for fever, chills and malaise/fatigue.  Respiratory: Negative for cough.   Genitourinary: Negative  for dysuria.     OBJECTIVE   BP 132/70  Pulse 82  Resp 12  Ht 5' 3 (1.6 m)  Wt 84.46 kg (186 lb 3.2 oz)  BMI 32.99 kg/m2 BMI: 32.99   Physical Exam  Nursing note and vitals reviewed. Constitutional: She is oriented to person, place, and time and well-developed, well-nourished, and in no distress.  HENT:  Head: Normocephalic and atraumatic.  Eyes: Conjunctivae are normal.  Neck: Neck supple. No thyromegaly present.  Cardiovascular: Normal rate, regular rhythm and intact distal pulses.  Exam reveals no gallop and no friction rub.   No carotid bruit 2/6 SEM RUSB  Pulmonary/Chest: Effort normal and breath sounds normal. She has no wheezes. She has no rales.  Musculoskeletal: She exhibits no edema.  Lymphadenopathy:    She has no cervical adenopathy.  Neurological: She is alert and oriented to person, place, and time.  Monofilament intact  Skin: Skin is warm and dry.  Psychiatric: Mood and affect normal.

## 2012-11-02 NOTE — Progress Notes (Signed)
 Quick Note:  Left message on cell phone that need to resume lantus Already fax'd to pharmacy To schedule f/u w/me on 27th ______

## 2012-11-06 NOTE — Progress Notes (Signed)
 Pt wanted to leave the following readings:  3/24 143/85  6:30am  186/86  5:00pm  4/7 186/93  2:24am     4/8 198/114 7:20am  194/103 8:00 pm  4/9 172/101 5:20am  4/17 144/88    4/19 158/78  4:00pm  5/7 116/61  7:50am  131/71  6:00am  5/14 134/85  7:30am   148/76  4:30pm  5/15 182/97  11:00pm  5/16 184/99  7:35am  140/80  12 noon  163/94  1:20pm  129/73  2:35pm  157/86  3:30pm  5/25 156/101 10:00 am  118/74  1:00pm    Pt just wanted to have record on file of bp readings

## 2012-11-07 NOTE — Progress Notes (Addendum)
 [facility] [address] [place], [place] [address] Phone: [phone] Fax: [phone]   NEUROPSYCHOLOGICAL ASSESSMENT  This report is confidential and contains sensitive medical information. Do not release or forward without expressed consent from the patient or legal guardian. Do not release to the individual being evaluated without permission from the examiner.   Name:    Theresa Mendez Age/Date of Birth:   67 y.o., 1946/03/12 Handedness:   Right Education:    16 years Marital Status:   Married Occupation:    Retired Date of Evaluation:   11/07/2012 Referral Source:   Tonny Sonny LABOR, PA  Impressions: 1.  Mild dementia The pattern of cognitive impairment seen on this exam confirms a mild dementia which is, unfortunately, almost certainly neurodegenerative.  She demonstrated impairment is several cognitive domains. Most notably, she performed in a manner to suggest an amnestic presentation, and did not benefit from recognition cuing.  Memory loss was the most prominent problem identified in the data.  This amnestic presentation is a hallmark pattern of memory loss associated with Alzheimer's dementia.  She also demonstrated associated language problems.  Though she was fluent in conversation, on confrontational naming, dysnomia is present.  Her judgment in daily demands was preserved, yet she had difficulty shifting sets and thinking flexibly, indicating some executive dysfunction as well.  Her social graces seemed well preserved during the assessment today.  She did not report nor did I review any record of behavior or personality changes (though note that she was unaccompanied for the visit and was the sole historian).   Recommendations:  Education will be offered to the patient with regards to the above findings and suggested recommendations. She is scheduled to meet and review results with the writer on 11/21/12.  Brain  health strategies (healthy diet, exercise, social activities, and mentally challenging activities) are recommended. The importance of exercise will be emphasized. I will suggest that she work with her family so that IADLs are monitored.  Utilize compensatory strategies as tolerated. A well established routine will likely be helpful, with reminders and prompts. Write things down, and keep a detailed calendar. I recommend that the patient and her family contact an attorney to consider obtaining documentation of his wishes for advanced medical directive and POA, if this has not already been completed.  We will discuss adjustment to her diagnosis and will provide support.  I will recommend the Alzheimer's Association for additional resources and support.  Return for re-evaluation as needed to rule out progression, monitor progress, and for treatment planning as medically necessary.   Chief complaint:  My doctor observed some things and I shared some things with my experience at work and why it was taking me so long to process things....that was the first presenting problem at work.  I never really saw any problems at home but I did notice I lost my temper and had mild outbursts. (This was out of character.)  The problem started 5-6 months ago and it has increasingly become more difficult for her to keep up with her workload.  She does not recall any difficulty with her memory, other than missing or forgetting deadlines.  She is having trouble concentrating.  She has mild difficulty with word finding.   Medical History:  The patient has no history of neurological problems prior to this  most recent complaint.  There is no history of closed head injury.  She denies any problems with her gait or balance.    She has been evaluated by Dr. Rubin and is followed by Sonny Bari, PA-C in the neurology service.  Records indicate that she first presented in March of this year to Dr. Rubin, and her MoCA score  was 16/30.  She was thought to have a mild dementia.    MRI brain, as read by Dr. Catherne on 09/03/2012:    Impression:     1. Extensive cerebral volume loss, more prominent in the frontal, parietal and temporal lobes as described above.   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     2. No acute intracranial hemorrhage. No acute infarct.     Clinical correlation is recommended.    A 07/24/12 CT of the head found:  Impression: No acute intracranial hemorrhage. No mass effect. Mild cerebral volume loss and atrophy. Chronic lacunar infarct in the posterior limb of the left basal ganglia. Mild chronic small vessel ischemic changes.  Patient Active Problem List  Diagnosis  . BENIGN HYPERTENSION  . Other and Unspecified Hyperlipidemia  . DIABETES MELLITUS TYPE II-UNCOMPL  . Breast Calcification Seen on Mammogram-right 6//'10-[facility]  . 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 DJD (degenerative joint disease), cervical-c spine CT 11/'10  . LVH (left ventricular hypertrophy)-mild concentric 1/'11 echo  . Osteopenia-1/'11 DEXA spine=-1.2, hips intact  . Vitamin D deficiency  . 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 S/P colonoscopy with polypectomy-10/05/2011 [place]  . Dementia    Current Outpatient Prescriptions on File Prior to Visit  Medication Sig Dispense Refill  . Insulin  Lispro (HUMALOG KWIKPEN) 100 unit/mL [place] insulin  pen Check sugar before meal and before bed If sugar 200-250 then 3 units If sugar 251-300 then 5 units If sugar 301-350 then 7 units If sugar 350-400 then 9 units If sugar >400 then 10 units  1 Box  3  . Insulin  Glargine (LANTUS SOLOSTAR) 100 unit/mL (3 mL) [place] insulin  pen Inject 15 Units beneath the skin Every Night at Bedtime.  1 Box  3  . Insulin  Pen Needles, Disposable, 31 x 3/16  Misc Ndle 1 Each by Combination route Once a Day.  1 Box  3  . GABAPENTIN PO Take 1 Cap by Mouth 3 Times  Daily.      . hydrALAzine (APRESOLINE) 50 mg PO TABS Take 1.5 Tabs by Mouth Every 8 Hours.  135 Tab  1  . metFORMIN (GLUCOPHAGE) 500 mg PO TABS Take 1 Tab by Mouth 2 Times Daily with Meals.  180 Tab  0  . donepezil  (ARICEPT ) 5 mg PO TABS Take 1 Tab by Mouth Once a Day. One tab PO qhs for one month, then increase to 2 tabs po qhs  60 Tab  2  . spironolactone (ALDACTONE) 25 mg PO TABS Take 0.5 Tabs by Mouth Once a Day.  30 Tab  1  . clopidogrel (PLAVIX) 75 mg PO TABS Take 1 Tab by Mouth Once a Day.  30 Tab  5  . traZODone (DESYREL) 50 mg PO TABS Take 1 Tab by Mouth Every Night at Bedtime. Take 1-2 tablets at bedtime for sleep  60 Tab  5  . TYPE-IN MEDICATION 1 Dose by Misc.(Non-Drug; Combo Route) route As Directed. Glucometer  1 Each  0  . Blood Sugar Diagnostic (BLOOD GLUCOSE TEST) Misc STRP 1 Each as directed As Directed.  1  Bottle  11  . metoprolol  (LOPRESSOR ) 100 mg PO TABS Take 1 Tab by Mouth Twice Daily.  60 Tab  1  . atorvastatin (LIPITOR) 10 mg PO TABS Take 1 Tab by Mouth Every Night at Bedtime.  30 Tab  5  . ergocalciferol (VITAMIN D2) 50,000 unit PO CAPS Take 1 Cap by Mouth See Admin Instrs. 1 cap twice a month (1st and 15th)  CLARIFICATION of prescription sent December 21st  2 Cap  11  . cyclobenzaprine (FLEXERIL) 10 mg PO TABS Take 5 mg by Mouth Every 8 Hours As Needed.      . lisinopril  (PRINIVIL ) 40 mg PO TABS Take 1 Tab by Mouth Once a Day.  90 Tab  1  . lansoprazole (PREVACID) 30 mg PO CPDR Take 1 Cap by Mouth Once a Day.  90 Cap  3  . Blood Sugar Diagnostic (ONE TOUCH ULTRA TEST) Misc STRP 1 Each by Misc.(Non-Drug; Combo Route) route. Use to test blood sugars 2-3 times daily  100 Strip  5  . BLOOD-GLUCOSE METER (ONE TOUCH ULTRA 2 MISC) by Misc.(Non-Drug; Combo Route) route.      . Glucosam-Chond-Hrb 149-Hyal Ac (GLUCOS CHOND CPLX ADVANCED) 750-100-125 mg PO TABS Take by Mouth.        No current facility-administered medications on file prior to visit.   Her mother had  Alzheimer's disease, developed sometime after her 70th birthday.  Her maternal great grandmother was also believed to have had dementia and was institutionalized for senility.  A paternal aunt also had Alzheimer's disease.    She is one of 8 siblings.  Her brother (about 4 years younger than she) was diagnosed with Alzheimer's disease within the past year.  Her oldest sister developed memory problems around age 90.  She has been told her sister had a lot of strokes.  Her youngest sister, about age 62, has some memory challenges and she is unsure of what they are related too.    Psychiatric History:  The patient has no history of formal psychiatric diagnosis or treatment.  She doesn't feel depressed.  She describes herself as an intense person and always has been.  She has some anxiety around her medical condition but feels it is within the normal range of what would be expected for her situation.  She denies thoughts of suicide or self harm.  There is no history of suicidal ideation or attempt.  She denied a history of abuse, neglect, or other trauma.    All of her life, she has been high energy and never required much sleep.  She sleeps 4-5 hours per night and feels rested.  She had a sleep study that did not show sleep apnea but identified snoring.  She denies nightmares as an adult, but had dreams as a child that were recurrent (of her falling down stairs).  She does not have any unusual movements in her sleep.    The patient has a good appetite with a relatively stable weight. She has dieted off and on the past year with [facility] Banks.    She denies abuse of alcohol or illicit drugs.  She does not use tobacco products.  She quit in 1996.    Family psychiatric history includes:  Her mother's half brother had a mental condition after serving in war.  Family history is also significant for a history of alcohol abuse in her brothers, father, maternal grandfather, and paternal grandfather.  She had one  brother who was dependent on drugs  for a period of time.    Psychosocial History:  She lives with her husband of 38 years.  She has 1 adult son who lives locally but travels often for work.  She is self sufficient in all IADLs.  She manages the finances for the household.  She noted that recently she has let due dates get by which she had never done before.  She is complaint with taking her medications on schedule.  She prepares simple meals at home and has never enjoyed cooking.  She is driving and noted that she has gotten lost once recently coming to [place].  She denies accidents or tickets.    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  She was an A, B, C student.  There is no history of special educational services in school.  She repeated statistics twice in summer school at the college level.  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  She held the position since about 1986.  She retired because her brother (the president of the company) came to her in late 2013 or early 2014 with concerns that she could no longer manage her work (and other employees had brought this to his attention).    Mental Status Examination/Behavioral Observations:  She arrived as scheduled. She was unaccompanied for the clinical interview. Informed consent procedures were reviewed with the patient, and written consent was obtained.  She is a well nourished [place] female who looks to be approximately her stated age. She ambulates independently. Posture was unremarkable. Grooming is good. She was dressed appropriately. She was alert and generally oriented. Her mood was situationally appropriate with a mildly constricted affect. She is pleasant interpersonally. She was attentive and cooperative. Thought stream was reality based and goal directed. Speech is fluent and articulate. She denied thoughts  of suicide. She denied auditory and visual hallucinations. No evidence of psychosis. Intellect was estimated to be in the above average range. Judgment appears preserved.  Insight is compromised.   Methods of Assessment:  Clinical Interview, [employer], Behavioral & Psychological Assessment of Dementia, Clock Drawing Test, Controlled Oral Word Association (F,A,S, Animals), Digit Vigilance Test, Geriatric Depression Rating Scale (30 item), Grooved Pegboard Test, [place] Dementia Rating Scale-2, Neuropsychological Assessment Battery (Judgment Subtest), Personality Assessment Inventory, [place]  Aphasia Screening, Repeatable Battery for the Assessment of Neuropsychological Status (Form A), Stroop Color Word Test, Test of Premorbid Functioning, Trailmaking Test,  Zung Anxiety Inventory  Data is available for inspection in Data Sheet addendum attached to this encounter.   Review of Data: On the MMSE, she scored 26/30.  Her clock drawing was fair (number placement was accurate and hands drawn to 11 and 2, but hands did not originate from the true center of the clock).  Her intellect was estimated to be in the average range based on a test of reading irregular words.  On a test of basic cognitive ability, which is sensitive to cognitive decline, her score was in the severely deficient range overall, with a weakness on the memory subscale. On a speeded test of manual dexterity, her performance fell in the mildly deficient range on her dominant side and in the moderately deficient range on her non-dominant side.  Her ability to repeat increasingly longer strings of digits was average.  Rapid visuomotor coding fell in the moderately deficient range compared to her peer group.  On  a sustained visual scanning task, her time to complete the task fell in the average range.  The number of errors made on the task reflected low average performance.  Simple visuomotor tracking fell in the severely range.  When  the task was made more complex and required conceptual shifting, her score was severely deficient compared to her peers (the task was eventually discontinued as she could not complete it in the allotted time).  Judgment in daily demands was high average.  On a test of thinking flexibly in which a habitual response needs to be suppressed in support of a novel one, she scored in the moderately deficient range.  Her figure copy skills were found to be high average.  Her ability to judge the orientation of lines in space fell in the low average range.  Semantic fluency, or ability to generate words from a category under a time demand, fell in the severely deficient range compared to her peer group.  On a screening test of confrontational naming, her score reflected severely deficient performance.  On a test of confrontational naming and expressive aphasia, she scored in the moderately deficient range.  Her phonemic verbal fluency was low average; while semantic verbal fluency fell in the moderately deficient range.  On an aphasia screening task, there was no evidence of spelling apraxia, acalculia, copying apraxia, or alexia. She made one anomic error, calling a triangle a rectangle.  On a test of memory and learning skills, her initial verbal list learning over trials was mildly deficient. Word recall after a delay fell in the severely deficient range, with severely deficient  word recognition. Story learning was moderately deficient, with severely deficient delayed recall of narrative information.  Her figure recall fell in the severely deficient range compared to her peers.   On face valid self report inventories, she reported a subclinical level of depression and anxiety.  On objective personality assessment, the clinical profile was entirely within normal limits.  Her self concept appears to involve a generally stable and positive self-evaluation.  Her  interpersonal style seems best characterized as one of  autonomy and balance.  Both her recent level of stress and her perceived level of social support are about average in comparison to normal adults.    This note has been sent to Sonny DELENA Bari, the requesting physician, with findings and recommendations via In Basket.    Thank you for allowing me to assist you in the care of this patient.  With kind regards,  Melissa Ples) Hunter, Psy.D. Licensed Clinical Psychologist Neuropsychologist, [facility]  In addition to the clinical interview, a total of 7 hours of neuropsychological services (including record review, test administration, test scoring, interpretation, and report writing) was completed by the examiner.

## 2012-11-13 NOTE — ED Notes (Signed)
 PA Mather at bedside eval pt.

## 2012-11-13 NOTE — ED Provider Notes (Addendum)
 [facility]   (790.29) Hyperglycemia  (785.0) Tachycardia, unspecified - improved  Assessment/Differential Diagnosis:  Tachycardia, hyperglycemia, electrolyte imbalance, arrythmia, dehydration   ED Course/Medical Decision Making:  Patient confirms patient is taking her lopressor . Patient given IV fluids which has helped to bring down her blood sugars as well as her heart rate - went over the need for her to be taking her medicines as prescribed, f/u with PCP. Patient has recently been dx with early dementia- consider assistance with medication scheduling/administration. Patient  verbalizes agreement and understanding of treatment plan. Return precautions given. .  .  Pre-Hospital/Procedures/Consults:  Talked with Dr. Sandrea - confirms that patient is NOT supposed to be taking the Indapamide, and IS supposed to be on insulin . Patient has a f/u appointment scheduled on June 9th. .  Disposition:  Home   Discharge Medication List as of 11/13/2012  5:17 PM     Chief Complaint  Patient presents with  . HYPERTENSION (ASYMPTOMATIC)    HPI 67 y/o F presents for elevated heart rate. Apparently has had a hx of the same, was on Indapamide which helped a lot - she believes she was taken off of it because of muscle cramps. Notes the past few days she has been having elevated heart rate readings  - no CP/SOB. She took an indapamide which seemed to help but then did have some cramping. She also has not been taking her insulin  because she believes her doctor wanted her to stop it.   Review of Systems: Constitutional: negative.   HENT: negative.   Respiratory: negative.   Cardiovascular:       tachycardia  Gastrointestinal: negative.   Musculoskeletal: negative.   Skin: negative.   Neurological: negative.     Physical Exam  Vitals reviewed. Constitutional: She is oriented to person, place, and time and well-developed, well-nourished, and in no distress.   HENT:  Head: Normocephalic and atraumatic.  Neck: Normal range of motion.  Cardiovascular: Regular rhythm and normal heart sounds.   tachycardia  Pulmonary/Chest: Effort normal. She has no wheezes.  Neurological: She is alert and oriented to person, place, and time. GCS score is 15.  Skin: Skin is warm and dry.  Psychiatric: Affect normal.    Past Medical History  Diagnosis Date  . Osteopenia   . Hypercholesteremia   . Hypertension   . Diabetes mellitus (HCC)   . Back pain   . Knee pain   . Colon polyp   . Hemorrhoid   . Esophageal reflux   . Piercing   . Unspecified cerebral artery occlusion with cerebral infarction [facility])    Past Surgical History  Procedure Laterality Date  . Colon resection  '00  . Hx hernia repair  2002  . Hx lysis of adhesions  '01  . Hx partial hysterectomy  1989    for uterine fibroids  . Hx other  2010    Bone removal in right wirst   Family History  Problem Relation Age of Onset  . Alcohol abuse Father   . Cancer, Prostate Father   . Hypertension Father   . Asthma Mother   . Hypertension Mother   . High Cholesterol Mother   . Stroke Mother   . Diabetes Sister   . Hypertension Sister   . High Cholesterol Sister   . Alcohol abuse Brother   . Cancer, Prostate Brother   . Hypertension Brother   . High Cholesterol Brother   . Diabetes Maternal Aunt   .  Cancer Maternal Aunt     Lung Cancer- Mom's half sister Share Father  . Diabetes Paternal Aunt   . Diabetes Maternal Uncle   . Diabetes Paternal Uncle   . Heart Disease Paternal Grandmother   . Alcohol abuse Maternal Grandfather   . Alcohol abuse Paternal Grandfather    History   Social History  . Marital Status: Married    Spouse Name: N/A    Number of Children: N/A  . Years of Education: N/A   Occupational History  . Not on file.   Social History Main Topics  . Smoking status: Former Games developer  . Smokeless tobacco: Never Used     Comment: quit 1990's  . Alcohol Use: No  .  Drug Use: No  . Sexually Active: Yes -- Female partner(s)   Other Topics Concern  . Caffeine Concern No  . Exercise No  . Seat Belt Yes  . Self-Exams No   Social History Narrative  . No narrative on file   No Facility-Administered Medications for the 11/13/12 encounter [facility] Encounter).   Outpatient Prescriptions Marked as Taking for the 11/13/12 encounter [facility] Encounter)  Medication Sig Dispense Refill  . Insulin  Lispro (HUMALOG KWIKPEN) 100 unit/mL [place] insulin  pen Check sugar before meal and before bed If sugar 200-250 then 3 units If sugar 251-300 then 5 units If sugar 301-350 then 7 units If sugar 350-400 then 9 units If sugar >400 then 10 units  1 Box  3  . hydrALAzine (APRESOLINE) 50 mg PO TABS Take 1.5 Tabs by Mouth Every 8 Hours.  135 Tab  1  . metFORMIN (GLUCOPHAGE) 500 mg PO TABS Take 1 Tab by Mouth 2 Times Daily with Meals.  180 Tab  0  . [DISCONTINUED] donepezil  (ARICEPT ) 5 mg PO TABS Take 1 Tab by Mouth Once a Day. One tab PO qhs for one month, then increase to 2 tabs po qhs  60 Tab  2  . spironolactone (ALDACTONE) 25 mg PO TABS Take 0.5 Tabs by Mouth Once a Day.  30 Tab  1  . clopidogrel (PLAVIX) 75 mg PO TABS Take 1 Tab by Mouth Once a Day.  30 Tab  5  . [DISCONTINUED] traZODone (DESYREL) 50 mg PO TABS Take 1 Tab by Mouth Every Night at Bedtime. Take 1-2 tablets at bedtime for sleep  60 Tab  5  . [DISCONTINUED] metoprolol  (LOPRESSOR ) 100 mg PO TABS Take 1 Tab by Mouth Twice Daily.  60 Tab  1  . atorvastatin (LIPITOR) 10 mg PO TABS Take 1 Tab by Mouth Every Night at Bedtime.  30 Tab  5  . ergocalciferol (VITAMIN D2) 50,000 unit PO CAPS Take 1 Cap by Mouth See Admin Instrs. 1 cap twice a month (1st and 15th)  CLARIFICATION of prescription sent December 21st  2 Cap  11  . lisinopril  (PRINIVIL ) 40 mg PO TABS Take 1 Tab by Mouth Once a Day.  90 Tab  1  . lansoprazole (PREVACID) 30 mg PO CPDR Take 1 Cap by Mouth Once a Day.  90 Cap  3   Allergies  Allergen Reactions   . Zocor (Simvastatin) muscle/joint pain    Vital Signs: No data found.    Documentation Review:  Initial ED Provider Note , Old medical records, Nursing notes  Diagnostics: Labs:   Results for orders placed during the hospital encounter of 11/13/12  URINALYSIS POC (LAB)      Result Value Range   URINE pH 5.0  5.0-8.0 pH  URINE PROTEIN SCREEN Trace (*) Negative mg/dL   URINE GLUCOSE 499* (*) Negative mg/dL   URINE KETONES Negative  Negative mg/dL   URINE OCCULT BLOOD Negative  Negative   URINE SPECIFIC GRAVITY 1.020  1.003-1.030   URINE NITRITE Negative  Negative   URINE LEUKOCYTE ESTERASE Negative  Negative   URINE BILIRUBIN Negative  Negative   URINE UROBILINOGEN 0.2  0.2-1.0 mg/dL  [id number] (LAB)      Result Value Range   POTASSIUM 4.9  3.5-5.5 mmol/L   CHLORIDE 93 (*) 98-110 mmol/L   CALCIUM IONIZED 4.5  4.4-5.4 mg/dL   CO2 69.9  79.9-67.9 mmol/L   GLUCOSE 407 (*) 65-99 mg/dL   BUN 22  3-77 mg/dL   CREATININE 0.7 (*) 9.1-8.5 mg/dL   SODIUM 865 (*) 863-854 mmol/L   HGB 14.6  11.7-16.1 g/dL   HCT 56.9  64.8-51.6 %   CARTRIDGE TYPE CHEM8+    GLUCOSE      Result Value Range   GLUCOSE POC 329 (*) 70 - 110 mg/dL  GLUCOSE SCREEN (LAB)      Result Value Range   GLUCOSE POC 329 (*) 65-99 MG/DL   ECG: Results for orders placed during the hospital encounter of 11/13/12  EKG 12 LEAD UNIT PERFORMED      Result Value Range Status   Heart Rate 114   Final   RR 526   Final   PR Interval 136   Final   QRSD Interval 78   Final   QT Interval 328   Final   QTC Interval 452   Final   P Axis 68   Final   QRS Axis 57   Final   T Wave Axis 3   Final   EKG Severity - BORDERLINE ECG -   Final   EKG Impression SINUS TACHYCARDIA   Final   EKG Impression BORDERLINE ST DEPRESSION, DIFFUSE LEADS   Final   EKG Impression No old for comparsion   Final          [facility]

## 2012-11-23 NOTE — Progress Notes (Signed)
 [facility] [address], [address] [place], [place] [address] Telephone: [phone] Fax: [phone]  Out-Patient Follow-Up Visit   Patient Name: Theresa Mendez DOB: 1946-02-26 MRN:  [id number]  Sonny A. Tonny, MPA, PA-C  11/22/2012    PCP:  Candis [name], MD   Follow up for DEMENTIA   IMPRESSION:     ICD-9-CM  1. Alzheimer disease 331.0  2. Brain atrophy 331.9    PLAN:  Referral to neurocognitive specialist, Dr. Gust, at the patient's request.  She is concerned about the MRI of the brain findings most of all.   She continue Aricept  at 10 mg qhs No change to other medications I gave her several literature items from the Alzheimer[employer].  She already has a copy of the 36 Hour Day book. RTC after follow-up with Dr. Gust, I informed the patient that I can see her again in six months. The patient is to continue to follow with his/her PCP for all other health conditions. The patient indicates understanding of these issues and agrees to the plan.  INTERVAL HISTORY:   History of Present Illness: Theresa Mendez is a 67 y.o. right handed African American female.   She is here for follow-up of memory problems and some agitation.  She had MRI of the brain showing some atypical atrophy pattern, as below.  Neuropsychological testing was most c/w a neurodegenerative, mild Alzheimer's picture.  She continues to have problems with ST memory, but she feels that this has stabilized on Aricept  at 10 mg qhs.  She denies any side effects of this medication.  She was having some agitation issues with people in her life; this seems to have eased some now that she has retired.  She is able to handle all ADL's without difficulty.  Review of Interval Testing: Neuropsychological testing showed mild dementia, most likely of Alzheimer's type MRI brain, personally viewed, as read by Dr. Catherne on 09/03/2012:  Impression:     1. Extensive  cerebral volume loss, more prominent in the frontal, parietal and temporal lobes as described above.  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    2. No acute intracranial hemorrhage. No acute infarct.    Clinical correlation is recommended.     CBC Lab Results  Component Value Date/Time   WBC 6.6 04/09/2012  9:01 AM   RBC 4.02 04/09/2012  9:01 AM   HEMOGLOBIN 14.6 11/13/2012  2:39 PM   HEMOGLOBIN 12.0 04/09/2012  9:01 AM   HCT 43.0 11/13/2012  2:39 PM   HCT 36.0 04/09/2012  9:01 AM   MCV 90 04/09/2012  9:01 AM   MCH 30 04/09/2012  9:01 AM   MCHC 33 04/09/2012  9:01 AM   RDW 12.4 04/09/2012  9:01 AM   PLATELET 288 04/09/2012  9:01 AM   MPV 10.6 04/09/2012  9:01 AM   Comprehensive Metabolic Profile Lab Results  Component Value Date/Time   NA 134* 11/13/2012  2:39 PM   NA 135 11/01/2012  9:17 AM   POTASSIUM 4.9 11/13/2012  2:39 PM   POTASSIUM 4.3 11/01/2012  9:17 AM   CHLORIDE 93* 11/13/2012  2:39 PM   CHLORIDE 92* 11/01/2012  9:17 AM   CO2 30.0 11/13/2012  2:39 PM   CO2 27 11/01/2012  9:17 AM   ANIONGAP 16.0 11/01/2012  9:17 AM   BUN 22 11/13/2012  2:39 PM   BUN 18 11/01/2012  9:17 AM   CREAT 0.7* 11/13/2012  2:39 PM  CREAT 0.7* 11/01/2012  9:17 AM   GLUCOSE 407* (seen in the ER for this) 11/13/2012  2:39 PM   GLUCOSE 410* 11/01/2012  9:17 AM   CALCIUM 9.9 11/01/2012  9:17 AM   ALBUMIN 4.3 03/03/2010  8:07 AM   TOTPR 7.2 03/03/2010  8:07 AM   ALKPHOS 81 03/03/2010  8:07 AM   SGOTAST 17 07/16/2012  8:23 AM   SGPTALT 17 07/16/2012  8:23 AM   BILIT 0.2 03/03/2010  8:07 AM   Thyroid Studies   Lab Results  Component Value Date/Time   TSH 1.90 11/01/2012  9:17 AM   No results found for this basename: T3, FREET3, T3UPTAKE, THYROXINBDGL,        Lab Results  Component Value Date/Time   CHOLESTEROL 139 07/16/2012  8:23 AM   HDL 48 07/16/2012  8:23 AM   LDLIPO 69 07/16/2012  8:23 AM   TRIGLYCERIDE 114 07/16/2012  8:23 AM     Current Outpatient Prescriptions   Medication Sig Dispense Refill  . metoprolol  (LOPRESSOR ) 100 mg PO TABS Take 1 Tab by Mouth Twice Daily.  60 Tab  5  . donepezil  (ARICEPT ) 10 mg PO TABS Take 1 Tab by Mouth Every Night at Bedtime.  30 Tab  5  . Insulin  Lispro (HUMALOG KWIKPEN) 100 unit/mL [place] insulin  pen Check sugar before meal and before bed If sugar 200-250 then 3 units If sugar 251-300 then 5 units If sugar 301-350 then 7 units If sugar 350-400 then 9 units If sugar >400 then 10 units  1 Box  3  . Insulin  Pen Needles, Disposable, 31 x 3/16  Misc Ndle 1 Each by Combination route Once a Day.  1 Box  3  . GABAPENTIN PO Take 1 Cap by Mouth 3 Times Daily.      . hydrALAzine (APRESOLINE) 50 mg PO TABS Take 1.5 Tabs by Mouth Every 8 Hours.  135 Tab  1  . metFORMIN (GLUCOPHAGE) 500 mg PO TABS Take 1 Tab by Mouth 2 Times Daily with Meals.  180 Tab  0  . spironolactone (ALDACTONE) 25 mg PO TABS Take 0.5 Tabs by Mouth Once a Day.  30 Tab  1  . clopidogrel (PLAVIX) 75 mg PO TABS Take 1 Tab by Mouth Once a Day.  30 Tab  5  . TYPE-IN MEDICATION 1 Dose by Misc.(Non-Drug; Combo Route) route As Directed. Glucometer  1 Each  0  . Blood Sugar Diagnostic (BLOOD GLUCOSE TEST) Misc STRP 1 Each as directed As Directed.  1 Bottle  11  . atorvastatin (LIPITOR) 10 mg PO TABS Take 1 Tab by Mouth Every Night at Bedtime.  30 Tab  5  . ergocalciferol (VITAMIN D2) 50,000 unit PO CAPS Take 1 Cap by Mouth See Admin Instrs. 1 cap twice a month (1st and 15th)  CLARIFICATION of prescription sent December 21st  2 Cap  11  . cyclobenzaprine (FLEXERIL) 10 mg PO TABS Take 5 mg by Mouth Every 8 Hours As Needed.      . lisinopril  (PRINIVIL ) 40 mg PO TABS Take 1 Tab by Mouth Once a Day.  90 Tab  1  . lansoprazole (PREVACID) 30 mg PO CPDR Take 1 Cap by Mouth Once a Day.  90 Cap  3  . Blood Sugar Diagnostic (ONE TOUCH ULTRA TEST) Misc STRP 1 Each by Misc.(Non-Drug; Combo Route) route. Use to test blood sugars 2-3 times daily  100 Strip  5  . BLOOD-GLUCOSE METER  (ONE TOUCH ULTRA 2 MISC)  by Misc.(Non-Drug; Combo Route) route.      . Glucosam-Chond-Hrb 149-Hyal Ac (GLUCOS CHOND CPLX ADVANCED) 750-100-125 mg PO TABS Take by Mouth.        No current facility-administered medications for this visit.   Allergies  Allergen Reactions  . Zocor (Simvastatin) muscle/joint pain   Past Medical History  Diagnosis Date  . Osteopenia   . Hypercholesteremia   . Hypertension   . Diabetes mellitus (HCC)   . Back pain   . Knee pain   . Colon polyp   . Hemorrhoid   . Esophageal reflux   . Piercing   . 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  Normal attention and concentration.  Normal fund of knowledge.  Speech is fluent and appropriate language function.  Cranial Nerves:   CN II:   Visual fields are full to confrontation testing. CN III, IV, VI:  Extraocular movements are intact and without nystagmus.     CN V:  Facial sensation in tact.  CN VII:  Facial movement is symmetric and of normal strength. CN VIII: Hearing is normal to whisper.   CN IX:  Palate movement in tact. CN XI:  Shoulder shrug strength normal. CN XII:  Tongue protrusion normal.  Motor:  Examination shows normal strength with normal muscle tone in all four extremities.    Sensory: Examination is grossly intact to touch.   Coordination:  The patient has good coordination, normal manner of stance and gait function testing.   Sonny A. Tonny, MPA, PA-C   This document has been electronically signed by Sonny LABOR. Tonny, MPA, PA-C on 11/23/2012

## 2012-11-26 NOTE — Progress Notes (Signed)
 ASSESSMENT:   1-T2DM Metformin 500mg  bid humalog SSI for now pending pt calling in sugars from home.  Pt had been well controlled on metformin for 2 yrs until just past 4 months.  Pt blames poor diet-appears to be doing better, however recent dementia diagnosis making compliance re: medications/management a challenge. 5/'14 foot, 6/'14 eye On ace/plavix/statin -pt to call in sugars  2-HTN My check left arm 152/84, pt's monitor 152/85 so correlates My check 6/9 visit 120/70 All meds brought today are correct Hydralazine 75mg  tid Aldactone 12.5mg  Lisinopril  40mg  Lopressor  100mg  bid 24 hour urine for pheochromocytoma still not done-reminded pt of same 6/'14 bmp; 9/'13 m/c  3-right carotid bruit-new -carotid duplex u/s -f/u for result  4-vitamin D def Twice monthly high dose Lab Results  Component Value Date   VITD25OH 39.6 07/16/2012   5-HPL lipitor 10mg  Lab Results  Component Value Date/Time   CHOLESTEROL 139 07/16/2012  8:23 AM   HDL 48 07/16/2012  8:23 AM   LDLIPO 69 07/16/2012  8:23 AM   TRIGLYCERIDE 114 07/16/2012  8:23 AM   6-H.M. Pt wrote down that needs to schedule mammogram  PLAN:   Discussed evaluation, treatment and usual course. Patient verbalizes understanding and agrees with plan of care. All questions were answered. Medication Orders this Encounter  Medications  . topiramate (TOPAMAX) 25 mg PO TABS    Sig: Take 75 mg by Mouth. 3 PO Q HS   Side effects of prescribed medicine was discussed. Orders:   Orders Placed This Encounter  Procedures  . PVL CAROTID ARTERY BILATERAL       SUBJECTIVE:   Chief Complaint  Patient presents with  . HYPERTENSION  . DIABETES  . HYPERLIPIDEMIA   67y/o AAMF brought bp monitor for correlation as well as medications Home bps: 142/70 P=83 148/83 P=88 182/88 P=94 127/69 P=73 108/67 P=67 yesterday AM 111/69 112/70 135/67 144/71 She however has again forgotten to bring BS log. Denies diaphoresis, dizziness.   Reports infrequent use humalog SSI use HYPERTENSION Pertinent negatives include no chest pain, headaches or shortness of breath.  DIABETES Pertinent negatives include no chest pain, chills, coughing, fever, headaches or nausea.  HYPERLIPIDEMIA Pertinent negatives include no chest pain, chills, coughing, fever, headaches or nausea.     Review of Systems  Constitutional: Negative for fever and chills.  Respiratory: Negative for cough and shortness of breath.   Cardiovascular: Negative for chest pain.  Gastrointestinal: Negative for nausea.  Neurological: Negative for dizziness and headaches.     OBJECTIVE   BP 122/64  Pulse 64  Resp 16  Wt 86.637 kg (191 lb)  BMI 33.84 kg/m2 BMI: 33.84   Physical Exam  Nursing note and vitals reviewed. Constitutional: She is oriented to person, place, and time and well-developed, well-nourished, and in no distress.  HENT:  Head: Normocephalic and atraumatic.  Eyes: Conjunctivae are normal.  Neck: Neck supple. No thyromegaly present.  Cardiovascular: Normal rate and regular rhythm.  Exam reveals no gallop and no friction rub.   right carotid bruit 2/6 SEM RUSB  Pulmonary/Chest: Effort normal and breath sounds normal. She has no wheezes. She has no rales.  Musculoskeletal: She exhibits no edema.  Lymphadenopathy:    She has no cervical adenopathy.  Neurological: She is alert and oriented to person, place, and time.  Skin: Skin is warm and dry.  Psychiatric: Mood and affect normal.

## 2013-01-15 NOTE — Progress Notes (Signed)
 ASSESSMENT:  1-HTN Pt brought pill box-turns out she take hydralazine 75mg  AM and at 3PM,no evening dose. bp monitor had correlated at 6/16 visit Aldactone 12.5mg  Lisinopril  40mg  Lopressor  100mg  bid 24 hr urine for catecholamines/metanephrines notable for slightly increased normetanephrines at '67' upper limit listed as '500' No diaphoresis, flushing, palpitations or headaches currently (did have in past)  6/'14 bmp -plasma metanephrines next lab -needs m/c next lab -pt to increase hydralazine to 100mg  AM, 1PM and take 50mg  in evening-this was written out for her -f/u in 3-4 wks  2-T2DM Metformin 500mg  bid humalog SSI 5/'14 foot; 6/'14 eye exam 5/'14 tsh On ace/plavix/statin -add januvia-wrote out that pt to stop metformin as converting to janumet 50/500 bid -hgba1c end of month  3-vitamin D def Twice monthly high dose Lab Results  Component Value Date   VITD25OH 39.6 07/16/2012  -level next lab  4-HPL lipitor 10mg  Lab Results  Component Value Date/Time   CHOLESTEROL 139 07/16/2012  8:23 AM   HDL 48 07/16/2012  8:23 AM   LDLIPO 69 07/16/2012  8:23 AM   TRIGLYCERIDE 114 07/16/2012  8:23 AM   5-H.M. 7/'14 mammogram  PLAN:   Discussed evaluation, treatment and usual course. Patient verbalizes understanding and agrees with plan of care. All questions were answered. Medication Orders this Encounter  Medications  . Sitagliptin-Metformin (JANUMET) 50-500 mg PO TABS    Sig: Take 1 Tab by Mouth 2 Times Daily with Meals.    Dispense:  60 Tab    Refill:  2  . hydrALAzine (APRESOLINE) 100 mg PO TABS    Sig: Take 1 Tab by Mouth 3 Times Daily.    Dispense:  90 Tab    Refill:  5   Side effects of prescribed medicine was discussed. Orders:   Orders Placed This Encounter  Procedures  . METANEPHRINES PLASMA  . VITAMIN D 25 HYDROXY  . HGB A1C WITH EST AVG GLUCOSE  . MICROALBUMIN RANDOM URINE       SUBJECTIVE:   Chief Complaint  Patient presents with  . DIABETES  .  HYPERTENSION  . HYPERLIPIDEMIA   67y/o AAMF in f/u re: HTN, sugar BP before medication 158/82 P=80, 156/75, 166/85, 164/94 About 2 hrs post AM meds: 166/85, 141/72, 160/87  Sugars: 0600 177, 183, 202, 176 0930 224 1PM 124, 176, 224  Continues to experience diffuse neck discomfort   DIABETES Pertinent negatives include no chest pain, chills, coughing, fever, headaches or nausea.  HYPERTENSION Pertinent negatives include no chest pain, headaches or shortness of breath.  HYPERLIPIDEMIA Pertinent negatives include no chest pain, chills, coughing, fever, headaches or nausea.     Review of Systems  Constitutional: Negative for fever and chills.  Respiratory: Negative for cough and shortness of breath.   Cardiovascular: Negative for chest pain and leg swelling.  Gastrointestinal: Negative for heartburn and nausea.  Neurological: Negative for dizziness and headaches.     OBJECTIVE   BP 142/84  Pulse 78  Resp 20  Wt 87.091 kg (192 lb)  BMI 34.02 kg/m2 BMI: 34.02   Physical Exam  Nursing note and vitals reviewed. Cardiovascular: Normal rate and regular rhythm.  Exam reveals no gallop and no friction rub.   2/6 SEM RUSB  Pulmonary/Chest: Effort normal and breath sounds normal. She has no wheezes. She has no rales.  Musculoskeletal: She exhibits no edema.  Neurological: She is alert.  Psychiatric: Mood and affect normal.

## 2013-02-13 NOTE — Progress Notes (Signed)
 ASSESSMENT:  1-HTN Pt actually taking hydralazine 100mg  tid  Confirmed that taking lopressor  100mg  bid Did not confirm that taking aldactone 12.5mg  daily nor lisniopril 40mg  (due to time constraint and having to look up color of pills) in pill box Bps remain slightly suboptimal 5/'14 pvl renal=wnl Vitals 11/19/2012 11/22/2012 11/26/2012 01/15/2013 02/13/2013  Weight (lb) 187 lb 189 lb 191 lb 192 lb 195 lb  No diaphoresis, flushing, palpitations Does have headaches though relieved w/flexeril and do seem to generate from cervical spine so suspect muscular skeletal normetanephrines in 24 hour urine '503' upper limit of normal listed as '500'   Ref. Range 02/05/2013 09:11  Normetanephrine Plasma Latest Range: 0 - 145 pg/mL 155 (H)  Minimal elevation-do not suspect pheochromocytoma based on this, however will obtain adrenal CT scan to eval  -adrenal ct scan -pt encouraged to lose weight -1 mo f/u  2-T2DM janumet 50/500 bid started 8/5 visit HEMOGLOBIN A1C  Date Value Range Status  02/05/2013 6.8* 4.8-5.9 % Final  humalog SSI 5/'14 foot; 6/'14 eye exam 5/'14 tsh On ace/plavix/statin  3-vitamin D def Twice monthly high dose Lab Results  Component Value Date   VITD25OH 21.6* 02/05/2013  Pt confirms compliance w/1st and 15th of the month dosing -increase to weekly  4-HPL lipitor 10mg  Lab Results  Component Value Date/Time   CHOLESTEROL 139 07/16/2012  8:23 AM   HDL 48 07/16/2012  8:23 AM   LDLIPO 69 07/16/2012  8:23 AM   TRIGLYCERIDE 114 07/16/2012  8:23 AM   5-H.M. 7/'14 mammogram Pt asked about [place]-reviewed warning re: valvular disease-pt will defer  PLAN:   Discussed evaluation, treatment and usual course. Patient verbalizes understanding and agrees with plan of care. All questions were answered. Medication Orders this Encounter  Medications  . ergocalciferol (VITAMIN D2) 50,000 unit PO CAPS    Sig: Take 1 Cap by Mouth Every 7 Days.    Dispense:  4 Cap    Refill:  2   Side  effects of prescribed medicine was discussed. Orders:   Orders Placed This Encounter  Procedures  . CT ABDOMEN/PELVIS W/ CONTRAST       SUBJECTIVE:   Chief Complaint  Patient presents with  . DIABETES  . HYPERTENSION    67y/o AAMF in f/u re: diabetes, HTN Brought week pill box but had forgotten pill bottles.  Also brought bp monitor and log 9/2 11:45AM 125/70 sugar 110 9/1 0800 before meds 153/80,sugar 142; before bed 150/73 8/31 before med 162/86, sugar 151 8/18 0830 before meds 170/87  DIABETES Pertinent negatives include no abdominal pain, chest pain, coughing or nausea.  HYPERTENSION Pertinent negatives include no chest pain, palpitations or shortness of breath.     Review of Systems  HENT:       Headache 1-2 times a week which she describes as affecting right cervical radiating into right retro orbital Saw Dr Helen in May Alleviates w/flexeril  Respiratory: Negative for cough and shortness of breath.   Cardiovascular: Negative for chest pain, palpitations and leg swelling.  Gastrointestinal: Negative for nausea and abdominal pain.  Neurological: Negative for dizziness.     OBJECTIVE   BP 160/92  Pulse 69  Temp(Src) 98.1 F (36.7 C) (Oral)  Resp 16  Wt 88.451 kg (195 lb)  BMI 34.55 kg/m2 BMI: 34.55   Physical Exam  Nursing note and vitals reviewed. Cardiovascular: Normal rate and regular rhythm.  Exam reveals no gallop and no friction rub.   No murmur heard. Pulmonary/Chest: Breath sounds normal. She  has no wheezes. She has no rales.  Neurological: She is alert.  Psychiatric: Mood and affect normal.

## 2013-02-14 NOTE — Progress Notes (Signed)
 [facility] [address], [address] [place], [place] [address] Phone: [phone] Fax: [phone]      Assessment: 1. Probable Alzheimer's dementia, mild severity based on her amnestic cognitive profile and atrophy on MRI 2. Symptomatic benefit from donepezil   Plan:  The entire 40 minute visit  was spent on counseling related to prognosis, medication options, potential use of biomarkers (and their limitations), and non-pharmacological prevention strategies. See separate Patient Instruction Note. We will follow her in our Cognitive Program. Next visit 6 months [place] and 12 months with me.  Pertinent History Theresa Mendez returns in follow-up neurological care for probable Alzheimer's dementia. She was initially seen by Dr. Rubin in March 2014 and had neuropsychological testing by Dr. Katrinka May 2014 notable for an amnestic pattern with poor memory storage.   She described feeling like cloudy thinking was made clearer with donepezil  10mg .  Work-up MRI head 09/03/12 showed significant frontal, parietal and temporal atrophy. No significant vascular changes.  Home sleep test Feb 2014 with an Apnea link for approximately 3.75 hours total recording showed AHI 0.   Normal TSH and MMA in April - Aug 2014.     Past Medical History  Diagnosis Date  . Osteopenia   . Hypercholesteremia   . Hypertension   . Diabetes mellitus (HCC)   . Back pain   . Knee pain   . Colon polyp   . Hemorrhoid   . Esophageal reflux   . Piercing   . Unspecified cerebral artery occlusion with cerebral infarction [facility])      Past Surgical History  Procedure Laterality Date  . Colon resection  '00  . Hx hernia repair  2002  . Hx lysis of adhesions  '01  . Hx partial hysterectomy  1989    for uterine fibroids  . 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 Zocor  (Simvastatin) muscle/joint pain    Current Outpatient Prescriptions on File Prior to Visit  Medication Sig Dispense Refill  . ergocalciferol (VITAMIN D2) 50,000 unit PO CAPS Take 1 Cap by Mouth Every 7 Days.  4 Cap  2  . Blood Sugar Diagnostic (ONE TOUCH ULTRA TEST) Misc STRP 50 Each by Misc.(Non-Drug; Combo Route) route Twice Daily.  2 Box  2  . lansoprazole (PREVACID) 30 mg PO CPDR Take 1 Cap by Mouth Once a Day.  90 Cap  3  . clopidogrel (PLAVIX) 75 mg PO TABS Take 1 Tab by Mouth Once a Day.  30 Tab  5  . lisinopril  (PRINIVIL ) 40 mg PO TABS Take 1 Tab by Mouth Once a Day.  90 Tab  1  . Sitagliptin-Metformin (JANUMET) 50-500 mg PO TABS Take 1 Tab by Mouth 2 Times Daily with Meals.  60 Tab  2  . hydrALAzine (APRESOLINE) 100 mg PO TABS Take 1 Tab by Mouth 3 Times Daily.  90 Tab  5  . atorvastatin (LIPITOR) 10 mg PO TABS Take 1 Tab by Mouth Every Night at Bedtime.  90 Tab  1  . spironolactone (ALDACTONE) 25 mg PO TABS Take 0.5 Tabs by Mouth Once a Day.  45 Tab  1  . metoprolol  (LOPRESSOR ) 100 mg PO TABS Take 1 Tab by Mouth Twice Daily.  60 Tab  5  . donepezil  (ARICEPT ) 10 mg PO TABS Take 1 Tab by Mouth Every Night at Bedtime.  30 Tab  5  .  Insulin  Lispro (HUMALOG KWIKPEN) 100 unit/mL [place] insulin  pen Check sugar before meal and before bed If sugar 200-250 then 3 units If sugar 251-300 then 5 units If sugar 301-350 then 7 units If sugar 350-400 then 9 units If sugar >400 then 10 units  1 Box  3  . Insulin  Pen Needles, Disposable, 31 x 3/16  Misc Ndle 1 Each by Combination route Once a Day.  1 Box  3  . TYPE-IN MEDICATION 1 Dose by Misc.(Non-Drug; Combo Route) route As Directed. Glucometer  1 Each  0  . Blood Sugar Diagnostic (BLOOD GLUCOSE TEST) Misc STRP 1 Each as directed As Directed.  1 Bottle  11  . cyclobenzaprine (FLEXERIL) 10 mg PO TABS Take 5 mg by Mouth Every 8 Hours As Needed.      . Blood Sugar Diagnostic (ONE TOUCH ULTRA TEST) Misc STRP 1 Each by Misc.(Non-Drug; Combo Route) route.  Use to test blood sugars 2-3 times daily  100 Strip  5  . BLOOD-GLUCOSE METER (ONE TOUCH ULTRA 2 MISC) by Misc.(Non-Drug; Combo Route) route.      . Glucosam-Chond-Hrb 149-Hyal Ac (GLUCOS CHOND CPLX ADVANCED) 750-100-125 mg PO TABS Take by Mouth.        No current facility-administered medications on file prior to visit.      PE:   Filed Vitals:   02/14/13 1316  BP: 158/78  Pulse: 73  Temp: 98.2 F (36.8 C)  Resp: 13  Height: 5' 3 (1.6 m)  Weight: 87.998 kg (194 lb)  SpO2: 97%   Body mass index is 34.37 kg/(m^2).  Appeared well, no distress.  No edema Normal language. She asked insightful questions including when namenda  would be appropriate, her prognosis, the value of vitamin E.

## 2013-03-12 ENCOUNTER — Encounter

## 2013-03-18 NOTE — Progress Notes (Signed)
 Ct exam completed and explained

## 2013-03-21 NOTE — Progress Notes (Signed)
  The Side Effects and Adverse Reactions of the influenza vaccine, described below, were discussed with the patient prior to the administration of the vaccine.     The influenza vaccine contains inactivated viruses and cannot c a u s e i n f l u e n z a . The most frequent side effect of the vaccine is soreness at the site of the injection, which may last up to two days. Other side effects include:  * Fever, malaise (tiredness) and myalgia (muscle aches); these may begin 6 to 12 hours after the injection and can be present for one to two days. * Immediate allergic reactions such as hives, asthma, angioedema (swelling of various parts of the body) or anaphylaxis (sudden shock like state) presumably to egg protein, occur rarely.  The inactivated Influenza vaccine would not be given to: (1) any person with known hypersensitivity to eggs/egg products; (2) any person with an acute febrile illness until the fever and symptoms have abated; (3) individuals known to be sensitive to Thimerosal;  The patient's vaccine was administered after obtaining consent and checking for contraindications.  The patient had no adverse effects from the vaccine. See the Immunization Activity for the immunization/injection specific information.  Beth C Capps

## 2013-03-21 NOTE — Progress Notes (Signed)
 ASSESSMENT:   1-HTN My bp check c/w nursing  Not optimal prinivil  40mg  Hydralazine 100mg  tid Aldactone 12.5mg  daily Lopressor  100mg  bid 6/'14 bmp 5/'14 pvl renal=wnl On amlodipine , clonidine in the past w/suboptimal bps as well. Advised that pt bring pill dispenser/bottles to next visit to ensure compliance -one month f/u w/bmp at that visit  2-vitamin D deficiency Increased from twice a monthly to weekly at 9/2 visit  3-probable mild Alzheimers Dz/anxiety Former managed by neurology Scoring moderate on GAD7 and 'mild' on PHQ9 screens Pt tearful during this encounter -trial of zoloft 50mg  daily -reassess in 3-4 wks  -T2DM  janumet 50/500 bid started 8/5 visit  HEMOGLOBIN A1C   Date  Value  Range  Status   02/05/2013  6.8* , m/c 4.8-5.9 %  Final   humalog SSI  5/'14 foot; 6/'14 eye exam  5/'14 tsh  On ace/plavix/statin  HPL lipitor 10mg  Lab Results  Component Value Date/Time   CHOLESTEROL 139 07/16/2012  8:23 AM   HDL 48 07/16/2012  8:23 AM   LDLIPO 69 07/16/2012  8:23 AM   TRIGLYCERIDE 114 07/16/2012  8:23 AM   H.M. 7/'14 mammogram PLAN:   Discussed evaluation, treatment and usual course. Patient verbalizes understanding and agrees with plan of care. All questions were answered. Medication Orders this Encounter  Medications  . sertraline (ZOLOFT) 50 mg PO TABS    Sig: Take 1 Tab by Mouth Once a Day.    Dispense:  30 Tab    Refill:  1   Side effects of prescribed medicine was discussed. Orders:   Orders Placed This Encounter  Procedures  . IMMUNIZATION ADM, SINGLE OR COMBO VACCINE-FLU [id number]  . AFLURIA  (MEDICARE ONLY)       SUBJECTIVE:   Chief Complaint  Patient presents with  . DIABETES  . HYPERTENSION  . HYPERLIPIDEMIA  . DEPRESSION    PATIENT STATES SHE IS DEPRESSION AND IS CRYING.  HAS NOT TOLD HER FAMILY ABOUT HER ILLNESS.    . IMM/INJ  67y/o AAMF in f/u re: diabetes, HTN-did not bring pill dispenser 172/89 P=62, 181/92 P=74, 198/87 P=84-  all done before medication Signed up for water aerobics I feel down this morning.  Not a good week for me.  Is frustrated when people say don't you remember me telling you that? DIABETES Pertinent negatives include no abdominal pain, chest pain, headaches or nausea.  HYPERTENSION Pertinent negatives include no chest pain, headaches or shortness of breath.  HYPERLIPIDEMIA Pertinent negatives include no abdominal pain, chest pain, headaches or nausea.  DEPRESSION   IMM/INJ Pertinent negatives include no abdominal pain, chest pain, headaches or nausea.     Review of Systems  Respiratory: Negative for shortness of breath.   Cardiovascular: Negative for chest pain and leg swelling.  Gastrointestinal: Negative for heartburn, nausea and abdominal pain.  Neurological: Negative for dizziness and headaches.     OBJECTIVE   BP 156/84  Pulse 78  Resp 20  Wt 87.998 kg (194 lb)  BMI 33.82 kg/m2 BMI: 33.82   Physical Exam  Nursing note and vitals reviewed. Constitutional: She is oriented to person, place, and time.  Cardiovascular: Normal rate and regular rhythm.  Exam reveals no gallop and no friction rub.   No murmur heard. Musculoskeletal: She exhibits no edema.  Neurological: She is alert and oriented to person, place, and time.  Psychiatric: Mood and affect normal.

## 2013-04-15 NOTE — Progress Notes (Signed)
 ASSESSMENT:  1-HTN Reviewed medications and pill box personally-compliance confirmed Vitals 02/13/2013 02/14/2013 03/18/2013 03/21/2013 04/15/2013  Weight (lb) 195 lb 194 lb  194 lb 194 lb 6.4 oz   Lopressor  100mg  bid Lisinopril  40mg  Aldactone 12.5mg  Hydralazine 100mg  tid  Control still suboptimal, however prior amlodipine , clonidine have not added to current control. 5/'14 pvl renal 8/'14 m/c Encourage wt loss thru diet/exercise -bmp -3 mo f/u  2-probable mild Alzheimers Dz/anxiety  Former managed by neurology  Scoring moderate on GAD7 and 'mild' on PHQ9 screens 10/9 visit Recommend rechallenge w/zoloft 50mg -if leg cramping recurs, pt to let me know and will then try celexa (pt recalls SSRI in past which she tolerated-she will try to find name and let me know)  3-T2DM  janumet 50/500 bid started 8/5 visit  HEMOGLOBIN A1C   Date  Value  Range  Status   02/05/2013  6.8* , m/c  4.8-5.9 %  Final   humalog SSI  5/'14 foot; 6/'14 eye exam  5/'14 tsh  On ace/plavix/statin -hgba1c in a month  vitamin D deficiency  Increased from twice a monthly to weekly at 9/2 visit Lab Results  Component Value Date   VITD25OH 21.6* 02/05/2013    HPL  lipitor 10mg   Lab Results   Component  Value  Date/Time    CHOLESTEROL  139  07/16/2012 8:23 AM    HDL  48  07/16/2012 8:23 AM    LDLIPO  69  07/16/2012 8:23 AM    TRIGLYCERIDE  114  07/16/2012 8:23 AM    H.M.  7/'14 mammogram  PLAN:   Discussed evaluation, treatment and usual course. Patient verbalizes understanding and agrees with plan of care. All questions were answered.  Orders:   Orders Placed This Encounter  Procedures  . BASIC METABOLIC PANEL  . HGB A1C WITH EST AVG GLUCOSE  . ALT  . AST       SUBJECTIVE:   Chief Complaint  Patient presents with  . HYPERTENSION  . ANXIETY    HAD TO DC THE ZOLFT, NEEDS TO DISCUSS  . DIABETES   67y/o AAMF in f/u re: diabetes, anxiety, HTN Stopped zoloft as seemed to have anterior leg  cramping shortly post taking which has since resolved.  Pt admits that often has same leg cramping so isn't sure if coincidental but hasn't rechallenged.  Had taken flexeril to help.  Normally takes flexeril prn low back pain.  Pt brought meds, pill box HYPERTENSION Pertinent negatives include no chest pain, headaches or shortness of breath.  ANXIETY   DIABETES Pertinent negatives include no abdominal pain, chest pain, coughing, headaches or nausea.     Review of Systems  Respiratory: Negative for cough and shortness of breath.   Cardiovascular: Negative for chest pain.  Gastrointestinal: Negative for heartburn, nausea and abdominal pain.  Neurological: Negative for dizziness and headaches.     OBJECTIVE   BP 152/86  Pulse 78  Resp 20  Wt 88.179 kg (194 lb 6.4 oz)  BMI 33.89 kg/m2 BMI: 33.89   Physical Exam  Nursing note and vitals reviewed. Constitutional: She is oriented to person, place, and time.  Cardiovascular: Normal rate and regular rhythm.  Exam reveals no gallop and no friction rub.   No murmur heard. Pulmonary/Chest: She has no wheezes. She has no rales.  Musculoskeletal: She exhibits no edema.  Neurological: She is alert and oriented to person, place, and time.  Psychiatric: Mood and affect normal.

## 2013-07-22 NOTE — Progress Notes (Signed)
 ASSESSMENT:  1-T2DM Vitals 03/21/2013 04/15/2013 07/22/2013  Weight (lb) 194 lb 194 lb 6.4 oz 187 lb  janumet 50/500 8/'14 m/c 6/'14 eye exam Foot exam today On ace/plavix/statin -hgba1c, tsh -3 mo f/u  2-HTN Reviewed pt's pill box Lopressor  100mg  bid Lisinopril  40mg  Aldactone 12.5mg  Hydralazine 100mg  tid Vitals 04/15/2013 07/22/2013  BP 152/86 148/88  Borderline control Hoping for continued weight loss which may help Note that amlodipine  , clonidine in the past did not add to current control  3-vitamin D def Weekly high dose Lab Results  Component Value Date   VITD25OH 40.0 07/22/2013   4-HPL lipitor 10mg  Lab Results  Component Value Date/Time   CHOLESTEROL 140 07/22/2013 10:09 AM   HDL 49 07/22/2013 10:09 AM   LDLIPO 75 07/22/2013 10:09 AM   TRIGLYCERIDE 84 07/22/2013 10:09 AM   5-probable mild Alzheimers Dz/anxiety  Former managed by neurology  Pt stopped taking zoloft and denies depression  6-H.M. 7/'14 mammogram  7-OD irritation No conjunctival injection on today's exam No red flags of eye pain, photophobia or change in visual acuity Rec: continued warm moist compresses prn and good hand hygiene PLAN:   Discussed evaluation, treatment and usual course. Patient verbalizes understanding and agrees with plan of care. All questions were answered. Medication Orders this Encounter  Medications  . nabumetone (RELAFEN) 750 mg PO TABS    Sig: Take 750 mg by Mouth Twice Daily.  . lisinopril  (PRINIVIL ) 40 mg PO TABS    Sig: Take 1 Tab by Mouth Once a Day.    Dispense:  90 Tab    Refill:  1  . metFORMIN (GLUCOPHAGE) 500 mg PO TABS    Sig: Take 1 Tab by Mouth Twice Daily.    Dispense:  60 Tab    Refill:  3   Side effects of prescribed medicine was discussed. Orders:   Orders Placed This Encounter  Procedures  . VENIPUNCTURE  . SPECIMEN HANDLING FEE  . HGB A1C WITH EST AVG GLUCOSE  . TSH  . CBC  . VITAMIN D 25 HYDROXY  . LIPID COMPLETE PANEL  . AST  . ALT        SUBJECTIVE:   Chief Complaint  Patient presents with  . DIABETES    FU  . HYPERTENSION  . HYPERLIPIDEMIA  . EYE DRAINAGE    C/O EYE DRAINAGE THIS AM, ITCHES, PT STATES SHE  FEELS LIKE SHE MAY BE COMING DOWN WITH A COLD OR SOMETHING   68y/o AAMF in f/u re: HTN, diabetes Feels as if managing medications better and that mood is better. Not following a structured diet but monitoring what I eat  SUgars 120s, 113, 132, 123 BPs 150/64, 154/80,  2 days of right eye drainage/itches, crusting in the AM.  Some sinus drainage.  No change in visual acuity/blurring/pain  Past medical, social, family history reviewed DIABETES Pertinent negatives include no abdominal pain, chest pain, chills, coughing, fever or nausea.  HYPERTENSION Pertinent negatives include no chest pain, malaise/fatigue or shortness of breath.  HYPERLIPIDEMIA Pertinent negatives include no abdominal pain, chest pain, chills, coughing, fever or nausea.  EYE DRAINAGE Pertinent negatives include no abdominal pain, chest pain, chills, coughing, fever or nausea.     Review of Systems  Constitutional: Negative for fever, chills and malaise/fatigue.  HENT:       Has been at least a month since last headache  Respiratory: Negative for cough and shortness of breath.   Cardiovascular: Negative for chest pain and leg swelling.  fluttering occuring 2-3 times a week lasting seconds  Gastrointestinal: Negative for heartburn, nausea and abdominal pain.  Genitourinary: Negative for dysuria.  Musculoskeletal:       DR Alaine, spine center, had recommended lumbar surgery which pt is declining for now  Neurological: Negative for dizziness.     OBJECTIVE   BP 148/88  Pulse 78  Resp 18  Wt 84.823 kg (187 lb)  BMI 32.6 kg/m2 BMI: 32.6   Physical Exam  Nursing note and vitals reviewed. Eyes: Conjunctivae are normal. Pupils are equal, round, and reactive to light. Right eye exhibits no discharge. No scleral icterus.   Cardiovascular: Normal rate, regular rhythm and intact distal pulses.  Exam reveals no gallop.   No murmur heard. Pulmonary/Chest: She has no wheezes. She has no rales.  Musculoskeletal: She exhibits no edema.  Neurological: She is alert.  Monofilament intact  Psychiatric: Mood and affect normal.

## 2013-07-23 NOTE — Progress Notes (Signed)
 Quick Note:  Pt is committed to continued diet-son is promoting mediterranean diet D/c janumet Convert to metformin alone 500mg  bid ______

## 2013-10-21 ENCOUNTER — Emergency Department (INDEPENDENT_AMBULATORY_CARE_PROVIDER_SITE_OTHER)
Admission: EM | Admit: 2013-10-21 | Discharge: 2013-10-21 | Disposition: A | Discharge status: Home / Self Care | Payer: Medicare Other | Source: Home / Self Care | Attending: Family Medicine | Admitting: Family Medicine

## 2013-10-21 ENCOUNTER — Encounter (HOSPITAL_COMMUNITY): Payer: Self-pay | Admitting: Emergency Medicine

## 2013-10-21 DIAGNOSIS — R7309 Other abnormal glucose: Secondary | ICD-10-CM

## 2013-10-21 DIAGNOSIS — M79609 Pain in unspecified limb: Secondary | ICD-10-CM

## 2013-10-21 DIAGNOSIS — R739 Hyperglycemia, unspecified: Secondary | ICD-10-CM

## 2013-10-21 DIAGNOSIS — I10 Essential (primary) hypertension: Secondary | ICD-10-CM

## 2013-10-21 HISTORY — DX: Essential (primary) hypertension: I10

## 2013-10-21 HISTORY — DX: Type 2 diabetes mellitus without complications: E11.9

## 2013-10-21 HISTORY — DX: Unspecified dementia, unspecified severity, without behavioral disturbance, psychotic disturbance, mood disturbance, and anxiety: F03.90

## 2013-10-21 LAB — GLUCOSE, CAPILLARY: Glucose-Capillary: 210 mg/dL — ABNORMAL HIGH (ref 70–99)

## 2013-10-21 MED ORDER — INDOMETHACIN 25 MG PO CAPS: 25.0000 mg | ORAL_CAPSULE | Freq: Two times a day (BID) | ORAL | Status: DC

## 2013-10-21 NOTE — Discharge Instructions (Signed)
Please follow up with your doctor upon your return home to [place] DASH Diet The DASH diet stands for "Dietary Approaches to Stop Hypertension." It is a healthy eating plan that has been shown to reduce high blood pressure (hypertension) in as little as 14 days, while also possibly providing other significant health benefits. These other health benefits include reducing the risk of breast cancer after menopause and reducing the risk of type 2 diabetes, heart disease, colon cancer, and stroke. Health benefits also include weight loss and slowing kidney failure in patients with chronic kidney disease.  DIET GUIDELINES  Limit salt (sodium). Your diet should contain less than 1500 mg of sodium daily.  Limit refined or processed carbohydrates. Your diet should include mostly whole grains. Desserts and added sugars should be used sparingly.  Include small amounts of heart-healthy fats. These types of fats include nuts, oils, and tub margarine. Limit saturated and trans fats. These fats have been shown to be harmful in the body. CHOOSING FOODS  The following food groups are based on a 2000 calorie diet. See your Registered Dietitian for individual calorie needs. Grains and Grain Products (6 to 8 servings daily)  Eat More Often: Whole-wheat bread, brown rice, whole-grain or wheat pasta, quinoa, popcorn without added fat or salt (air popped).  Eat Less Often: [place] bread, Byers pasta, [place] rice, cornbread. Vegetables (4 to 5 servings daily)  Eat More Often: Fresh, frozen, and canned vegetables. Vegetables may be raw, steamed, roasted, or grilled with a minimal amount of fat.  Eat Less Often/Avoid: Creamed or fried vegetables. Vegetables in a cheese sauce. Fruit (4 to 5 servings daily)  Eat More Often: All fresh, canned (in natural juice), or frozen fruits. Dried fruits without added sugar. One hundred percent fruit juice ( cup [237 mL] daily).  Eat Less Often: Dried fruits with added sugar.  Canned fruit in light or heavy syrup. [employer], Fish, and Poultry (2 servings or less daily. One serving is 3 to 4 oz [85-114 g]).  Eat More Often: Ninety percent or leaner ground beef, tenderloin, sirloin. Round cuts of beef, chicken breast, [place] breast. All fish. Grill, bake, or broil your meat. Nothing should be fried.  Eat Less Often/Avoid: Fatty cuts of meat, [place], or chicken leg, thigh, or wing. Fried cuts of meat or fish. Dairy (2 to 3 servings)  Eat More Often: Low-fat or fat-free milk, low-fat plain or light yogurt, reduced-fat or part-skim cheese.  Eat Less Often/Avoid: Milk (whole, 2%).Whole milk yogurt. Full-fat cheeses. Nuts, Seeds, and Legumes (4 to 5 servings per week)  Eat More Often: All without added salt.  Eat Less Often/Avoid: Salted nuts and seeds, canned beans with added salt. Fats and Sweets (limited)  Eat More Often: Vegetable oils, tub margarines without trans fats, sugar-free gelatin. Mayonnaise and salad dressings.  Eat Less Often/Avoid: Coconut oils, palm oils, butter, stick margarine, cream, half and half, cookies, candy, pie. FOR MORE INFORMATION The Dash Diet Eating Plan: www.dashdiet.org Document Released: 05/19/2011 Document Revised: 08/22/2011 Document Reviewed: 05/19/2011 [facility] Patient Information 2014 [place], [place].  Glucose, Blood Sugar, Fasting Blood Sugar This is a test to measure your blood sugar. Glucose is a simple sugar that serves as the main source of energy for the body. The carbohydrates we eat are broken down into glucose (and a few other simple sugars), absorbed by the small intestine, and circulated throughout the body. Most of the body's cells require glucose for energy production; brain and nervous system cells not only rely on  glucose for energy, they can only function when glucose levels in the blood remain above a certain level.  The body's use of glucose hinges on the availability of insulin, a hormone produced by the  pancreas. Insulin acts as a Engineer, maintenance, transporting glucose into the body's cells, directing the body to store excess glucose as glycogen (for short-term storage) and/or as triglycerides in fat cells. We can not live without glucose or insulin, and they must be in balance.  Normally, blood glucose levels rise slightly after a meal, and insulin is secreted to lower them, with the amount of insulin released matched up with the size and content of the meal. If blood glucose levels drop too low, such as might occur in between meals or after a strenuous workout, glucagon (another pancreatic hormone) is secreted to tell the liver to turn some glycogen back into glucose, raising the blood glucose levels. If the glucose/insulin feedback mechanism is working properly, the amount of glucose in the blood remains fairly stable. If the balance is disrupted and glucose levels in the blood rise, then the body tries to restore the balance, both by increasing insulin production and by excreting glucose in the urine.  PREPARATION FOR TEST A blood sample drawn from a vein in your arm or, for a self check, a drop of blood from a skin prick; in general, it may be recommended that you fast before having a blood glucose test; sometimes a random (no preparation) urine sample is used. Your caregiver will instruct you as to what they want prior to your testing. NORMAL FINDINGS Normal values depend on many factors. Your lab will provide a range of normal values with your test results. The following information summarizes the meaning of the test results. These are based on the clinical practice recommendations of the American Diabetes Association.  FASTING BLOOD GLUCOSE  From 70 to 99 mg/dL (3.9 to 5.5 mmol/L): Normal glucose tolerance  From 100 to 125 mg/dL (5.6 to 6.9 mmol/L):Impaired fasting glucose (pre-diabetes)  126 mg/dL (7.0 mmol/L) and above on more than one testing occasion: Diabetes ORAL GLUCOSE TOLERANCE TEST  (OGTT) [EXCEPT PREGNANCY] (2 HOURS AFTER A 75-GRAM GLUCOSE DRINK)  Less than 140 mg/dL (7.8 mmol/L): Normal glucose tolerance  From 140 to 200 mg/dL (7.8 to 16.1 mmol/L): Impaired glucose tolerance (pre-diabetes)  Over 200 mg/dL (09.6 mmol/L) on more than one testing occasion: Diabetes GESTATIONAL DIABETES SCREENING: GLUCOSE CHALLENGE TEST (1 HOUR AFTER A 50-GRAM GLUCOSE DRINK)  Less than 140* mg/dL (7.8 mmol/L): Normal glucose tolerance  140* mg/dL (7.8 mmol/L) and over: Abnormal, needs OGTT (see below) * Some use a cutoff of More Than 130 mg/dL (7.2 mmol/L) because that identifies 90% of women with gestational diabetes, compared to 80% identified using the threshold of More Than 140 mg/dL (7.8 mmol/L). GESTATIONAL DIABETES DIAGNOSTIC: OGTT (100-GRAM GLUCOSE DRINK)  Fasting*..........................................95 mg/dL (5.3 mmol/L)  1 hour after glucose load*..............180 mg/dL (04.5 mmol/L)  2 hours after glucose load*.............155 mg/dL (8.6 mmol/L)  3 hours after glucose load* **.........140 mg/dL (7.8 mmol/L) * If two or more values are above the criteria, gestational diabetes is diagnosed. ** A 75-gram glucose load may be used, although this method is not as well validated as the 100-gram OGTT; the 3-hour sample is not drawn if 75 grams is used.  Ranges for normal findings may vary among different laboratories and hospitals. You should always check with your doctor after having lab work or other tests done to discuss the meaning of your test results  and whether your values are considered within normal limits. MEANING OF TEST  Your caregiver will go over the test results with you and discuss the importance and meaning of your results, as well as treatment options and the need for additional tests if necessary. OBTAINING THE TEST RESULTS It is your responsibility to obtain your test results. Ask the lab or department performing the test when and how you will get your  results. Document Released: 07/01/2004 Document Revised: 08/22/2011 Document Reviewed: 05/10/2008 [facility] Patient Information 2014 [place], [place].  Hypertension As your heart beats, it forces blood through your arteries. This force is your blood pressure. If the pressure is too high, it is called hypertension (HTN) or high blood pressure. HTN is dangerous because you may have it and not know it. High blood pressure may mean that your heart has to work harder to pump blood. Your arteries may be narrow or stiff. The extra work puts you at risk for heart disease, stroke, and other problems.  Blood pressure consists of two numbers, a higher number over a lower, 110/72, for example. It is stated as "110 over 72." The ideal is below 120 for the top number (systolic) and under 80 for the bottom (diastolic). Write down your blood pressure today. You should pay close attention to your blood pressure if you have certain conditions such as:  Heart failure.  Prior heart attack.  Diabetes  Chronic kidney disease.  Prior stroke.  Multiple risk factors for heart disease. To see if you have HTN, your blood pressure should be measured while you are seated with your arm held at the level of the heart. It should be measured at least twice. A one-time elevated blood pressure reading (especially in the Emergency Department) does not mean that you need treatment. There may be conditions in which the blood pressure is different between your right and left arms. It is important to see your caregiver soon for a recheck. Most people have essential hypertension which means that there is not a specific cause. This type of high blood pressure may be lowered by changing lifestyle factors such as:  Stress.  Smoking.  Lack of exercise.  Excessive weight.  Drug/tobacco/alcohol use.  Eating less salt. Most people do not have symptoms from high blood pressure until it has caused damage to the body. Effective  treatment can often prevent, delay or reduce that damage. TREATMENT  When a cause has been identified, treatment for high blood pressure is directed at the cause. There are a large number of medications to treat HTN. These fall into several categories, and your caregiver will help you select the medicines that are best for you. Medications may have side effects. You should review side effects with your caregiver. If your blood pressure stays high after you have made lifestyle changes or started on medicines,   Your medication(s) may need to be changed.  Other problems may need to be addressed.  Be certain you understand your prescriptions, and know how and when to take your medicine.  Be sure to follow up with your caregiver within the time frame advised (usually within two weeks) to have your blood pressure rechecked and to review your medications.  If you are taking more than one medicine to lower your blood pressure, make sure you know how and at what times they should be taken. Taking two medicines at the same time can result in blood pressure that is too low. SEEK IMMEDIATE MEDICAL CARE IF:  You develop  a severe headache, blurred or changing vision, or confusion.  You have unusual weakness or numbness, or a faint feeling.  You have severe chest or abdominal pain, vomiting, or breathing problems. MAKE SURE YOU:   Understand these instructions.  Will watch your condition.  Will get help right away if you are not doing well or get worse. Document Released: 05/30/2005 Document Revised: 08/22/2011 Document Reviewed: 01/18/2008 [facility] Patient Information 2014 [place], [place].

## 2013-10-21 NOTE — ED Provider Notes (Signed)
CSN: [id number]     Arrival date & time 10/21/13  1040 History   None    Chief Complaint  Patient presents with  . Hypertension  . Diabetes   (Consider location/radiation/quality/duration/timing/severity/associated sxs/prior Treatment) HPI Comments: Patient states she has recently been in [place] visiting her sister. He home [place] in [place]. She plans to head home today. [facility] reporting that since the beginning of May she has had an occasional elevated blood pressure reading and occasional elevated blood glucose reading when she measures these items at home.  CBGs have ranged from 86-200. BP reading have ranged from 140/80 to 170's/85-87. She also wishes to mention that she has developed some discomfort at her right 4th toe without known injury, but does endorse remote history of gout.  Patient is a 68 y.o. female presenting with hypertension and diabetes problem. The history is provided by the patient.  Hypertension  Diabetes    Past Medical History  Diagnosis Date  . Hypertension   . Diabetes mellitus without complication   . Dementia    Past Surgical History  Procedure Laterality Date  . Abdominal hysterectomy    . Colon surgery     No family history on file. History  Substance Use Topics  . Smoking status: Never Smoker   . Smokeless tobacco: Not on file  . Alcohol Use: No   OB History   Grav Para Term Preterm Abortions TAB SAB Ect Mult Living                 Review of Systems  Constitutional: Negative for fever and chills.  HENT: Negative.   Eyes: Negative.   Respiratory: Negative.   Cardiovascular: Negative.   Gastrointestinal: Negative.   Endocrine: Negative for polydipsia, polyphagia and polyuria.  Genitourinary: Negative.   Musculoskeletal:       See hpi  Skin: Negative.   Neurological: Negative.     Allergies  Review of patient's allergies indicates no known allergies.  Home Medications   Prior to Admission medications   Medication Sig  Start Date End Date Taking? Authorizing Provider  atorvastatin (LIPITOR) 10 MG tablet Take 10 mg by mouth daily.   Yes Historical Provider, MD  clopidogrel (PLAVIX) 300 MG TABS tablet Take 300 mg by mouth once.   Yes Historical Provider, MD  donepezil (ARICEPT) 10 MG tablet Take 10 mg by mouth at bedtime.   Yes Historical Provider, MD  lisinopril (PRINIVIL,ZESTRIL) 40 MG tablet Take 40 mg by mouth daily.   Yes Historical Provider, MD  metFORMIN (GLUCOPHAGE) 500 MG tablet Take by mouth 2 (two) times daily with a meal.   Yes Historical Provider, MD   BP 140/80  Pulse 65  Temp(Src) 97.9 F (36.6 C) (Oral)  Resp 18  SpO2 98% Physical Exam  Nursing note and vitals reviewed. Constitutional: She is oriented to person, place, and time. She appears well-developed and well-nourished. No distress.  HENT:  Head: Normocephalic and atraumatic.  Eyes: Conjunctivae are normal. No scleral icterus.  Neck: Normal range of motion. Neck supple.  Cardiovascular: Normal rate, regular rhythm and normal heart sounds.   Pulmonary/Chest: Effort normal and breath sounds normal. No respiratory distress. She has no wheezes.  Abdominal: Soft. Bowel sounds are normal. She exhibits no distension. There is no tenderness.  Musculoskeletal: Normal range of motion.       Feet:  Neurological: She is alert and oriented to person, place, and time.  Skin: Skin is warm and dry. No rash noted.  No erythema.  Psychiatric: She has a normal mood and affect. Her behavior is normal.    ED Course  Procedures (including critical care time) Labs Review Labs Reviewed  GLUCOSE, CAPILLARY - Abnormal; Notable for the following:    Glucose-Capillary 210 (*)    All other components within normal limits    Imaging Review No results found.   MDM   1. Hyperglycemia   2. Hypertension    Advised to follow up with her PCP regarding elevated blood pressure and blood glucose upon returning home to [place]. Encouraged to stick to a  DASH/Diabetic diet and get daily exercise. Post op shoe provided for toe discomfort. No clinical indication of infection or issue with circulation. Will advise close follow up with PCP if symptoms persist.     Ardis RowanJennifer Lee Presson, PA 10/21/13 1256

## 2013-10-21 NOTE — ED Notes (Signed)
Pt c/o HTN and DM BP this am was 157/82; Sugar level has been running high??? 5/4 = 205 [place] today Also c/o pain on 4th toe from greater [place] w/no signs of acute distress.

## 2013-10-24 NOTE — ED Provider Notes (Signed)
Medical screening examination/treatment/procedure(s) were performed by a resident physician or non-physician practitioner and as the supervising physician I was immediately available for consultation/collaboration.  Jaylianna Tatlock, MD    Kahleel Fadeley S Shiquita Collignon, MD 10/24/13 0816 

## 2014-01-23 NOTE — Progress Notes (Signed)
 ASSESSMENT:   1-T2DM janumet 50/500 fell off, replaced by metformin 500mg  bid but no januvia-not clear if insurance issue? Pt reminded that due for eye exam 2/'15 foot exam On ace/plavix/statin -hgba1c, m/c  2-b/l shoulder pain Right is reproducible, left moves well on exam, however pt experiences discomfort in both shoulders.  Reports as worse in the AM though thru out day.  No new fatigue however PMR in ddx and will check esr, crp to evaluate this.  Pt also to hold lipitor for the next 2 wks to see if may be the culprit.  3-HTN Lopressor  100mg  bid Lisinopril  40mg  Aldactone 12.5mg  Hydralazine 100mg  tid Pt brought meds and reviewed My check 120/70 Pt to bring monitor to visit in 4 wks for correlation as home bps are elevated Note that amlodipine , clonidine in the past did not add to current regime re: control -bmp  4-HPL lipitor 10mg  Lab Results  Component Value Date/Time   CHOLESTEROL 140 07/22/2013 10:09 AM   HDL 49 07/22/2013 10:09 AM   LDLIPO 75 07/22/2013 10:09 AM   TRIGLYCERIDE 84 07/22/2013 10:09 AM   On hold however due to shoulder pain-would need to be replaced if the culprit  5-vitamin D def Weekly high dose -level  6-palpitations Last TSH  Lab Results  Component Value Date/Time   TSH 1.54 07/22/2013 10:09 AM      Pt may not have for weeks, and when does occur lasting seconds so may not even be amenable to event monitor as so transient.  She is asx from it, however atrial fibrillation in ddx given age and HTN so will attempt to capture -48 hour holter  7-H.M. 7/'15 mammogram -prenar 13 PLAN:   Discussed evaluation, treatment and usual course. Patient verbalizes understanding and agrees with plan of care. All questions were answered.  Orders:   Orders Placed This Encounter  Procedures  . PR-PNEUMONIA ADM FEE FOR MEDICARE ONLY [phone])  . VENIPUNCTURE  . SPECIMEN HANDLING FEE  . PREVNAR (PNEUMO 13-VAL) IMM  . HOLTER MONITOR 24 UP TO 72 HOURS  .  C REACTIVE PROTEIN  . SEDIMENTATION RATE  . HGB A1C WITH EST AVG GLUCOSE  . BASIC METABOLIC PANEL  . VITAMIN D 25 HYDROXY  . MICROALBUMIN RANDOM URINE       SUBJECTIVE:   Chief Complaint  Patient presents with  . DIABETES    FASTING TODAY WAS 169   . HYPERTENSION  . HYPERLIPIDEMIA  . DEMENTIA  . IMM/INJ  67y/o AAMF in f/u re: diabetes, htn Doesn't have a routine eating regime. Fasting readings: 169, 204, 190, 219, 190 evening: 218, 234 Denies polyuria, polydipsia No diaphoresis, jitteriness, weakness  Blood pressures: AM 0300 163/74 P=84, 0800 155/83 P=90 felt palpitations, 11AM 129/69 P=69 163/81 P=81 0800 157/86 P=77 0800 156/80 P=66 mid day 160/79 P=90 evening 170/92 0800 P=70 800 143/75 P=66 12noon 140/77 +77 evening 130/70 No headache, chest pain, SOB Palpitations lasting seconds 'not often'  Only concern is back pain-will be making an appt w/Dr Alaine.  Sleeping or lying down at rest, legs 'turn in'.   If raises arms it 'hurts' all the time, more so in the AM.  Not weakness.  Duration 2 months. Denies any precipitating injury.  Past medical, social, family history reviewed DIABETES Pertinent negatives include no chest pain, chills, coughing, fever, headaches or nausea.  HYPERTENSION Pertinent negatives include no chest pain, headaches or shortness of breath.  HYPERLIPIDEMIA Pertinent negatives include no chest pain, chills, coughing, fever, headaches or nausea.  DEMENTIA   IMM/INJ Pertinent negatives include no chest pain, chills, coughing, fever, headaches or nausea.     Review of Systems  Constitutional: Negative for fever and chills.  Respiratory: Negative for cough and shortness of breath.   Cardiovascular: Positive for leg swelling. Negative for chest pain.  Gastrointestinal: Negative for heartburn and nausea.  Neurological: Negative for headaches.     OBJECTIVE   BP 142/84  Pulse 70  Resp 16  Wt 85.095 kg (187 lb 9.6 oz) BMI:  33.24   Physical Exam  Cardiovascular: Normal rate and regular rhythm.  Exam reveals no gallop.   No murmur heard. Pulmonary/Chest: She has no wheezes. She has no rales.  Musculoskeletal: She exhibits no edema.  No shoulder TTP Right shoulder ROM passively intact in flexion though w/pain posterior shoulder and intact in abduction though also w/pain posterior shoulder, mild apprehension in abduction/external rotation  Neurological: She is alert.  Psychiatric: Mood and affect normal.  Nursing note and vitals reviewed.

## 2014-03-04 NOTE — Progress Notes (Signed)
 [facility] [address] [place], [place] [address] Phone: [phone] Fax: [phone]      Assessment: 1. 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 MoCA score today was the same as March 2014 at 16/30,  but qualitatively she did worse on the cube and clock; memory benefited from cueing and she was not overtly amnestic during the interview  3. 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 In this context, her description of right foot inversion may be a dystonic phenomenon of parkinsonism as she does not have eversion weakness.   Plan: 1. We discussed that etiologically I still believe this is neurodegenerative as opposed to infectious/inflammatory or other category of illness, but the specific neurodegenerative process may be Lewy body pathology. Other etiologies such as corticobasal degeneration are possible (tauopathy). We discussed that knowing the specific degenerative process at this point does not lead to a management change but helps us  understand new symptoms going forward. In this context we again discussed biomarkers such as PET and CSF analysis and why those won't change management today.  2. Repeat formal neuropsychological testing to help gauge the pace and further refine the cognitive phenotype to best understand (and treat) her cognitive deficits  3. F/u 1 year but we will discuss her case at our cognitive team meeting after her repeat neuropsychology testing    30 minutes was spent face to face with > 50% time time counseling and coordinating care.  Pertinent History I first saw Theresa Mendez 02/14/13.  Cognitive problems were first evident at work early 2014. She was initially seen  by Dr. Rubin in March 2014 and had neuropsychological testing by Dr. Katrinka May 2014 notable for an amnestic pattern with poor memory storage, most suggestive of Alzheimer's pathology. She described feeling like cloudy thinking was made clearer with donepezil  10mg .  Work-up MRI head 09/03/12 showed significant frontal, parietal and temporal atrophy. No significant vascular changes.  Home sleep test Feb 2014 with an Apnea link for approximately 3.75 hours total recording showed AHI 0.   Normal TSH and MMA in April - Aug 2014.  Interval History:   She came alone today.   She lives with her husband, but he has left sided weakness from a stroke in 2008. She does household chores, cooking, manages the finances with a couple being late only a few days. She uses a chart to keep them organized. She drives without getting lost.   She gets to bed by 1am and spontaneously wakes at 6am. She thinks she might be able to fall asleep if she goes to bed earlier but she doesn't feel sleepy.   Her right foot spontaneously inverts at times. She attributed this to lumbar spinal stenosis.     Past Medical History  Diagnosis Date  . Osteopenia   . Hypercholesteremia   . Hypertension   . Diabetes mellitus (HCC)   . Back pain   . Knee pain   . Colon polyp   . Hemorrhoid   . Esophageal reflux   . Piercing   . Unspecified cerebral artery occlusion with cerebral infarction [facility])      Past Surgical History  Procedure Laterality Date  . Colon resection  '00  . Hx hernia repair  2002  . Hx lysis of adhesions  '01  .  Hx partial hysterectomy  1989    for uterine fibroids  . 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 Zocor [Simvastatin] muscle/joint pain    Current Outpatient Prescriptions on File Prior to Visit  Medication Sig Dispense Refill  . metFORMIN (GLUCOPHAGE) 500 mg PO TABS Take 1 Tab by Mouth Twice Daily. 60 Tab 1  . Insulin  Pen Needles,  Disposable, 31 x 3/16  Misc Ndle 1 Each by Combination route Once a Day. 1 Box 3  . lansoprazole (PREVACID) 30 mg PO CPDR Take 1 Cap by Mouth Once a Day. 90 Cap 3  . clopidogrel (PLAVIX) 75 mg PO TABS Take 1 Tab by Mouth Once a Day. 90 Tab 1  . hydrALAzine (APRESOLINE) 100 mg PO TABS Take 1 Tab by Mouth 3 Times Daily. 90 Tab 2  . spironolactone (ALDACTONE) 25 mg PO TABS Take 0.5 Tabs by Mouth Once a Day. 45 Tab 1  . lisinopril  (PRINIVIL ) 40 mg PO TABS Take 1 Tab by Mouth Once a Day. 90 Tab 0  . Blood Sugar Diagnostic (ONETOUCH ULTRA TEST) Misc STRP 50 Each by Misc.(Non-Drug; Combo Route) route Twice Daily. 2 Box 2  . donepezil  (ARICEPT ) 10 mg PO TABS Take 1 Tab by Mouth Every Night at Bedtime. 30 Tab 5  . ergocalciferol (VITAMIN D2) 50,000 unit PO CAPS Take 1 Cap by Mouth Every 7 Days. 4 Cap 5  . metoprolol  (LOPRESSOR ) 100 mg PO TABS Take 1 Tab by Mouth Twice Daily. 60 Tab 5  . atorvastatin (LIPITOR) 10 mg PO TABS Take 1 Tab by Mouth Every Night at Bedtime. 90 Tab 1  . Insulin  Lispro (HUMALOG KWIKPEN) 100 unit/mL [place] insulin  pen Check sugar before meal and before bed If sugar 200-250 then 3 units If sugar 251-300 then 5 units If sugar 301-350 then 7 units If sugar 350-400 then 9 units If sugar >400 then 10 units 1 Box 3  . TYPE-IN MEDICATION 1 Dose by Misc.(Non-Drug; Combo Route) route As Directed. Glucometer 1 Each 0  . Blood Sugar Diagnostic (BLOOD GLUCOSE TEST) Misc STRP 1 Each as directed As Directed. 1 Bottle 11  . cyclobenzaprine (FLEXERIL) 10 mg PO TABS Take 5 mg by Mouth Every 8 Hours As Needed.    . Blood Sugar Diagnostic (ONE TOUCH ULTRA TEST) Misc STRP 1 Each by Misc.(Non-Drug; Combo Route) route. Use to test blood sugars 2-3 times daily 100 Strip 5  . BLOOD-GLUCOSE METER (ONE TOUCH ULTRA 2 MISC) by Misc.(Non-Drug; Combo Route) route.    . Glucosam-Chond-Hrb 149-Hyal Ac (GLUCOS CHOND CPLX ADVANCED) 750-100-125 mg PO TABS Take by Mouth.      No current facility-administered  medications on file prior to visit.      PE:   Filed Vitals:   03/04/14 1107  BP: 146/79  Pulse: 67  Weight: 86.637 kg (191 lb)  SpO2: 96%   Body mass index is 33.84 kg/(m^2).  Appeared well, no distress.  No edema  Neurologically the patient was alert and cooperative.  Language was fluent, with normal comprehension and grammar.  Discourse was organized. Fund of knowledge was normal.   Affect was full.   MoCA 16/30 - 0/5 free recall but got 1 with a cue and 3 with multiple choice. Qualitatively significantly worse copy of a cube and clock numbering compared to March 2014.   Moderately masked expression. +Myerson's sign. EOM were full without nystagmus. There was normal facial  sensation; hypometric facial excursion bilaterally. Hearing was normal.   There was no pronator drift, asterixis, or tremor. Tone was mildly increased in the right wrist, no cogwheeling. Also mild, variable increased tone in the right leg. Full power in the biceps, triceps, wrist extensors, finger flex/extensors, ileopsoas, hamstring, quadriceps, tibialis anterior, gastrocs. Full in ankle inversion/eversion.  Finger tapping with decremental amplitude response on the right.   Gait testing showed normal base, stride, posture, and armswing.  Pull test with 3-4 corrective steps.

## 2014-03-14 ENCOUNTER — Ambulatory Visit: Admit: 2014-03-14 | Payer: MEDICARE | Attending: Orthopaedic Surgery | Primary: Internal Medicine

## 2014-03-14 DIAGNOSIS — M48061 Spinal stenosis, lumbar region without neurogenic claudication: Secondary | ICD-10-CM

## 2014-03-14 NOTE — Progress Notes (Signed)
Patient: Angela Potter                MRN: [id number]       SSN: [id number]  Date of Birth: 1945-09-20        AGE: 68 y.o.        SEX: female  Body mass index is 33.95 kg/(m^2).    PCP: Freeman Caldron, MD  03/14/2014      Chief Complaint   Patient presents with   ??? Follow-up       REVIEW OF SYSTEMS:    Chest pain: negative  Shortness of breath: negative  Fever: negative  Night sweats: negative  Weight loss: negative  Skin: no rash  Respiratory: negative for wheezing  Abdominal Pain: negative  Constitutional signs: negative    HISTORY OF PRESENT ILLNESS:  The patient is a 68 y.o., female who comes in today for follow-up of back pain. She states her low back pain has been severe recently and reports rating of 5/10 in office today. She c/o new symptoms of pain radiating down posterior right calf as well as contorting of muscles in bilateral foot.   She states she has seen Dr. Noel Gerold who suspects she may have Parkinson's disease. He is completing further work-up currently. She is also c/o bilateral shoulder pain recently.     Past Medical History   Diagnosis Date   ??? Diabetes (HCC)    ??? Arthritis    ??? GERD (gastroesophageal reflux disease)    ??? Hypertension    ??? High cholesterol    ??? Osteopenia    ??? Lumbar stenosis    ??? Alzheimer's dementia    ??? Back pain        Family History   Problem Relation Age of Onset   ??? Alzheimer Mother    ??? Stroke Mother    ??? Hypertension Mother    ??? Diabetes Mother    ??? Elevated Lipids Mother    ??? Hypertension Father    ??? Cancer Father      Prostate   ??? Other Father      Back problems   ??? Diabetes Sister    ??? Hypertension Sister    ??? Hypertension Brother    ??? Cancer Brother      Prostate x 4 brothers       Current Outpatient Prescriptions   Medication Sig Dispense Refill   ??? lansoprazole (PREVACID) 30 mg capsule Take  by mouth Daily (before breakfast).     ??? lisinopril (PRINIVIL, ZESTRIL) 40 mg tablet Take 40 mg by mouth daily.      ??? SPIRONOLACTONE (ALDACTONE PO) Take  by mouth. Takes 1/2 tab daily     ??? hydrALAZINE (APRESOLINE) 100 mg tablet Take 100 mg by mouth.     ??? metoprolol (LOPRESSOR) 100 mg tablet Take  by mouth two (2) times a day.     ??? atorvastatin (LIPITOR) 10 mg tablet Take 10 mg by mouth daily.     ??? clopidogrel (PLAVIX) 75 mg tablet Take  by mouth daily.     ??? donepezil (ARICEPT) 10 mg tablet Take 10 mg by mouth nightly.         No Known Allergies    Past Surgical History   Procedure Laterality Date   ??? Pr abdomen surgery proc unlisted       Scar tissue removed from abdomen   ??? Hx hernia repair     ??? Hx hysterectomy     ???  Hx other surgical       Colon polyp removal   ??? Hx colectomy         History     Social History   ??? Marital Status: MARRIED     Spouse Name: N/A     Number of Children: N/A   ??? Years of Education: N/A     Occupational History   ??? Not on file.     Social History Main Topics   ??? Smoking status: Never Smoker    ??? Smokeless tobacco: Never Used   ??? Alcohol Use: No   ??? Drug Use: Not on file   ??? Sexual Activity: Not on file     Other Topics Concern   ??? Not on file     Social History Narrative       PHYSICAL EXAMINATION:  BP 110/70 mmHg   Pulse 64   Temp(Src) 98.4 ??F (36.9 ??C) (Oral)   Resp 18   Ht 5\' 3"  (1.6 m)   Wt 191 lb 9.6 oz (86.909 kg)   BMI 33.95 kg/m2  GENERAL: Alert and oriented x3, in no acute distress, well-developed, well-nourished,   afebrile.  HEART: No JVD.  EYES: No scleral icterus   NECK: No significant lymphadenopathy   LUNGS: No respiratory compromise.  ABDOMEN: Soft, non-tender, non-distended.   MOTOR:   Pain with ROM of bilateral shoulders.    5/5 strength throughout the bilateral upper extremities. Hoffmann is negative bilaterally.        ASSESSMENT:    ICD-10-CM ICD-9-CM    1. Spinal stenosis of lumbar region without neurogenic claudication M48.06 724.02 REFERRAL TO PHYSICAL THERAPY        This note was dictated.    Written by Sharion DovePeyton Kremer, ScribeKick, as dictated by Gu[place]ford ShiMark Araceli Coufal, MD.

## 2014-03-14 NOTE — Patient Instructions (Signed)
Spinal Stenosis: After Your Visit  Your Care Instructions     Spinal stenosis is a narrowing of the canal that surrounds the spinal cord and nerve roots. The spine is made up of bones, or vertebrae. The spinal cord runs through an opening in the bones called the spinal canal. Sometimes, bone and ligament and disc tissue grow into this canal and press on the nerves that branch out from the spinal cord. This causes pain, numbness, or weakness???most often in the arms, legs, feet, and rear end (buttocks). Spinal stenosis can happen as you age. It can occur in the lower back or the neck.  You may be able to treat spinal stenosis with pain medicine and exercises to keep your spine strong and flexible. Your doctor may suggest physical therapy. Some people try cortisone (corticosteroid) shots to reduce swelling. However, you may need surgery if your pain and numbness are so bad that you cannot do normal activities.  Follow-up care is a key part of your treatment and safety. Be sure to make and go to all appointments, and call your doctor if you are having problems. It's also a good idea to know your test results and keep a list of the medicines you take.  How can you care for yourself at home?  ?? Ask your doctor if you can take an over-the-counter pain medicine, such as acetaminophen (Tylenol), ibuprofen (Advil, Motrin), or naproxen (Aleve). Be safe with medicines. Read and follow all instructions on the label.  ?? Reach and stay at a healthy weight. Too much weight is hard on your spine.  ?? Change positions when sitting to ease pain. For example, lean forward. This may reduce pressure on the spinal cord and its nerves.  ?? Stretch your back muscles as your doctor or physical therapist recommends. Here are a few exercises to try if your doctor says it is okay.  ?? Lie on your back and gently pull one bent knee to your chest. Put that foot back on the floor and then pull the other knee to your chest.   ?? Do pelvic tilts. Lie on your back with your knees bent. Tighten your stomach muscles. Pull your belly button (navel) in and up toward your ribs. You should feel like your back is pressing to the floor and your hips and pelvis are slightly lifting off the floor. Hold for 6 seconds while breathing smoothly.  ?? Press your back flat against a wall and slide down into a half squat. Hold for 6 seconds while breathing normally.  When should you call for help?  Call 911 anytime you think you may need emergency care. For example, call if:  ?? You suddenly cannot walk or stand.  ?? You have sudden weakness or numbness in both legs.  Call your doctor now or seek immediate medical care if:  ?? You have a new loss of bowel or bladder control.  ?? You have new pain, numbness, tingling, or weakness, especially in the buttocks, genital or rectal area, legs, or feet  ?? You have new or worse back pain with fever, painful urination, or other signs of a urinary tract infection.  Watch closely for changes in your health, and be sure to contact your doctor if:  ?? Your back pain gets worse or becomes more frequent.   Where can you learn more?   Go to http://www.healthwise.net/[facility]  Enter V369 in the search box to learn more about "Spinal Stenosis: After Your Visit."   ??   2006-2015 Healthwise, Incorporated. Care instructions adapted under license by [place] (which disclaims liability or warranty for this information). This care instruction is for use with your licensed healthcare professional. If you have questions about a medical condition or this instruction, always ask your healthcare professional. Healthwise, Incorporated disclaims any warranty or liability for your use of this information.  Content Version: 10.5.422740; Current as of: July 31, 2013

## 2014-03-18 NOTE — Telephone Encounter (Signed)
Called patient to inform her that her new note is at the front desk for her to pick up.  She stated she was on her way to pick up.

## 2014-03-18 NOTE — Telephone Encounter (Signed)
MS Isaac ,Latorria STOPPED BY YESTERDAY AFTERNOON TO RETURN A LETTER WHAT WAS WRITTEN FOR HER. SHE IS A PT OF DR Carney LivingKERNER AND STATES THAT IN HER LETTER SHE RECEIVED FOR EXPLANATION OF WHY SHE HAD TO MISS HER FLIGHT THE WORD (MEDICAL) IS TO BE IN PLACE OF MEDICATION. SHE WOULD LIKE TO PICK UP A NEW LETTER ASAP. THANK YOU

## 2014-03-20 NOTE — Addendum Note (Signed)
Addended by: Sharion DoveKREMER, Tonita Bills on: 03/20/2014 12:16 PM      Modules accepted: Orders

## 2014-03-21 MED ORDER — CYCLOBENZAPRINE 10 MG TAB
10 mg | ORAL_TABLET | Freq: Three times a day (TID) | ORAL | Status: DC | PRN
Start: 2014-03-21 — End: 2014-07-01

## 2014-03-21 NOTE — Telephone Encounter (Signed)
Called pt and informed Rx script was sent to Pharmacy.

## 2014-03-21 NOTE — Telephone Encounter (Signed)
Last Visit: 03/14/2014 with MD Carney LivingKerner   Next Appointment: none (MD noted to see patient back in 3 months or prn)  Previous Refill Requests: Per Misys, 02/26/2013 #30 with 5 refills    Requested Prescriptions     Pending Prescriptions Disp Refills   ??? cyclobenzaprine (FLEXERIL) 10 mg tablet 30 Tab      Sig: Take 0.5-1 Tabs by mouth three (3) times daily as needed for Muscle Spasm(s).

## 2014-03-29 NOTE — Progress Notes (Signed)
Transcription accepted by Kenzley Ke B on 03/31/2014 at  7:06 AM  ------

## 2014-03-29 NOTE — Progress Notes (Signed)
Transcription accepted by Minerva Bluett B on 03/31/2014 at  7:05 AM  ------

## 2014-03-29 NOTE — Progress Notes (Signed)
Transcription accepted by Nekoda Chock B on 03/31/2014 at  7:08 AM  ------

## 2014-04-08 NOTE — Telephone Encounter (Signed)
 Duplicate

## 2014-04-24 DIAGNOSIS — I152 Hypertension secondary to endocrine disorders: Secondary | ICD-10-CM | POA: Insufficient documentation

## 2014-04-24 DIAGNOSIS — E1169 Type 2 diabetes mellitus with other specified complication: Secondary | ICD-10-CM | POA: Insufficient documentation

## 2014-04-24 NOTE — Progress Notes (Signed)
 ASSESSMENT:   As detailed in the progress note below, the patient was assessed for:  (Z23) Need for prophylactic vaccination and inoculation against influenza - Plan: IMMUNIZATION ADM, SINGLE OR COMBO VACCINE-FLU [000528], FLU 0.5ML IM ([place] 65+ MEDICARE ONLY) VAC [id number]  1-T2DM Metformin 500mg  bid 2/'15 foot exam On ace/plavix/statin HEMOGLOBIN A1C  Date Value Ref Range Status  04/21/2014 7.8* 4.8-5.9 % Final  8/'15 m/c -vel[place] 100mg  daily -3 mo f/u  2-HTN Pt's monitor 137/67 My check 128/78 Recommended that pt obtain new monitor  Lopressor  100mg  bid Aldactone 12.5mg  Hydralazine 100mg  tid 8/'15 bmp  3-HPL Pt had stopped lipitor thinking that causing various aches-no difference so resumed Lab Results  Component Value Date/Time   CHOLESTEROL 140 07/22/2013 10:09 AM   HDL 49 07/22/2013 10:09 AM   LDLIPO 75 07/22/2013 10:09 AM   TRIGLYCERIDE 84 07/22/2013 10:09 AM   4-b/l shoulder pain Did not repeat exam from 8/13 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  However 8/13 exam seemed c/w primary shoulder issue as well.  REcommended that pt contact Dr Helene Sink, orthopedics, for an opinion re: shoulders  5-vitamin D def Weekly high dose Lab Results  Component Value Date   VITD25OH 39.5 01/23/2014   6-H.M. 7/'15 mammogram  PLAN:   Discussed evaluation, treatment and usual course. Patient verbalizes understanding and agrees with plan of care. All questions were answered. Medication Orders this Encounter  Medications  . metFORMIN (GLUCOPHAGE) 500 mg PO TABS    Sig: Take 1 Tab by Mouth Twice Daily.    Dispense:  180 Tab    Refill:  0    CYCLE FILL MEDICATION. Authorization is required for next refill.  . sitaGLIPtin (JANUVIA) 100 mg PO TABS    Sig: Take 1 Tab by Mouth Once a Day.    Dispense:  90 Tab    Refill:  0   Side effects of prescribed medicine was discussed. Orders:   Orders Placed This  Encounter  Procedures  . IMMUNIZATION ADM, SINGLE OR COMBO VACCINE-FLU [id number]  . FLU 0.5ML IM ([place] 65+ MEDICARE ONLY) VAC [id number]  . TSH  . COMPREHENSIVE METABOLIC PANEL  . HGB A1C WITH EST AVG GLUCOSE  . LIPID COMPLETE PANEL    Smoking history/exposure to environmental tobacco history? Yes, The health risk factors history of tobacco use related to patient's condition were reviewed on today's visit.  The patient's medical history, medicines, and social history (including tobacco usage) were reviewed and updated as appropriate.   SUBJECTIVE:   Chief Complaint  Patient presents with  . DIABETES  . HYPERTENSION  . DEMENTIA  . DEPRESSION  . IMM/INJ  68y/o AAMF in f/u re: diabetes, HTN Pt brought medications Brought bp monitor for correlation Back is an issue-when had seen Dr Alaine recently-had told him no surgery.  Her sister in [place] has a provider who is interested in doing an epidural steroid injection again.  Home sugars: This AM 191, yesterday 197, 173/157 in evening; 177/203; 246/253; 252 in evening; 219; 202, 216 Believes eating 'wrong stuff' Has not been cooking so 'eating whatevers there' Has used lispro kwik pen prn sliding scale Denies polyuria/polydipsia No low sugars-feeling jittery, diaphoretic, weak  Still experiencing b/l shoulder pain but seems to generate from neck. DIABETES Pertinent negatives include no abdominal pain, chest pain, chills, coughing, fever, headaches or nausea.  HYPERTENSION Pertinent negatives include no chest pain, headaches or palpitations.  DEMENTIA   DEPRESSION   IMM/INJ  Pertinent negatives include no abdominal pain, chest pain, chills, coughing, fever, headaches or nausea.     Review of Systems  Constitutional: Negative for fever and chills.  Respiratory: Negative for cough.   Cardiovascular: Negative for chest pain, palpitations and leg swelling.  Gastrointestinal: Negative for heartburn, nausea and abdominal  pain.  Neurological: Negative for dizziness and headaches.     OBJECTIVE   BP 140/72 mmHg  Pulse 66  Temp(Src) 98.1 F (36.7 C) (Oral)  Resp 16  Wt 86.637 kg (191 lb) BMI: 33.84   Physical Exam  Constitutional: She is oriented to person, place, and time.  Neck: No thyromegaly present.  Cardiovascular: Normal rate, regular rhythm and intact distal pulses.  Exam reveals no gallop.   No murmur heard. Pulmonary/Chest: She has no wheezes. She has no rales.  Musculoskeletal: She exhibits no edema.  Neurological: She is alert and oriented to person, place, and time.  Psychiatric: Mood and affect normal.  Nursing note and vitals reviewed.

## 2014-07-01 MED ORDER — CYCLOBENZAPRINE 10 MG TAB
10 mg | ORAL_TABLET | Freq: Three times a day (TID) | ORAL | Status: DC | PRN
Start: 2014-07-01 — End: 2014-12-01

## 2014-07-01 NOTE — Telephone Encounter (Signed)
Last Visit: 03/14/2014 with MD Carney LivingKerner    Next Appointment: noted to f/u in 3 months or as needed   Previous Refill Encounters: 03/21/2014 per NP Tiburcio PeaHarris #30     Requested Prescriptions     Pending Prescriptions Disp Refills   ??? cyclobenzaprine (FLEXERIL) 10 mg tablet 30 Tab 0     Sig: Take 0.5-1 Tabs by mouth three (3) times daily as needed for Muscle Spasm(s).

## 2014-07-04 NOTE — ED Provider Notes (Signed)
 [facility]   [phone]) Knee abrasion, right, initial encounter  (D90.06KJ) Lip injury, initial encounter  Assessment/Differential Diagnosis:  Knee fracture, contusion, abrasion, pain, lip laceration, abrasion  ED Course/Medical Decision Making:  No acute fracture, will treat symptomatically, f/u with PCP. Patient  verbalizes agreement and understanding of treatment plan. Return precautions given. .  . . Disposition:  Home   New Prescriptions   No medications on file   Chief Complaint  Patient presents with  . FALL  . KNEE INJURY    HPI  69 y/o F presents for right knee pain, lip pain. Stepped off a cement step, lost her balance and fell forward, hitting her knee and tipped forward Kissing the cement. Hit her lip, but did not continue to hit the rest of her head, no LOC, no headache.  Right wrist is sore, right knee with most pain.  Review of Systems: Constitutional: negative.   HENT: negative.        Right lip injury  Respiratory: negative.   Cardiovascular: negative.   Gastrointestinal: negative.   Musculoskeletal:       Right knee pain, abrasion  Skin: negative.   Neurological: negative.     Physical Exam  Constitutional: She is oriented to person, place, and time and well-developed, well-nourished, and in no distress.  HENT:  Head: Normocephalic.  Lower right lip with small/superficial laceration to buccal mucosa. No active bleeding, teeth intact.  Eyes: Pupils are equal, round, and reactive to light.  Neck: Normal range of motion.  Pulmonary/Chest: Effort normal. She has no wheezes.  Musculoskeletal:  Right knee: abrasion over patella, anterior tenderness. Knee with FROM, neurovascularly intact.   Neurological: She is alert and oriented to person, place, and time.  Skin: Skin is warm and dry.  Nursing note and vitals reviewed.   Past Medical History  Diagnosis Date  . Osteopenia   . Hypercholesteremia   . Hypertension    . Diabetes mellitus (HCC)   . Back pain   . Knee pain   . Colon polyp   . Hemorrhoid   . Esophageal reflux   . Piercing   . Unspecified cerebral artery occlusion with cerebral infarction    Past Surgical History  Procedure Laterality Date  . Colon resection  '00  . Hx hernia repair  2002  . Hx lysis of adhesions  '01  . Hx partial hysterectomy  1989    for uterine fibroids  . Hx other  2010    Bone removal in right wirst   Family History  Problem Relation Age of Onset  . Alcohol abuse Father   . Cancer, Prostate Father   . Hypertension Father   . Asthma Mother   . Hypertension Mother   . High Cholesterol Mother   . Stroke Mother   . Diabetes Sister   . Hypertension Sister   . High Cholesterol Sister   . Alcohol abuse Brother   . Cancer, Prostate Brother   . Hypertension Brother   . High Cholesterol Brother   . Diabetes Maternal Aunt   . Cancer Maternal Aunt     Lung Cancer- Mom's half sister Share Father  . Diabetes Paternal Aunt   . Diabetes Maternal Uncle   . Diabetes Paternal Uncle   . Heart Disease Paternal Grandmother   . Alcohol abuse Maternal Grandfather   . Alcohol abuse Paternal Grandfather    History   Social History  . Marital Status: Married    Spouse Name: N/A  Number of Children: N/A  . Years of Education: N/A   Occupational History  . Not on file.   Social History Main Topics  . Smoking status: Former Games developer  . Smokeless tobacco: Never Used     Comment: quit 1990's  . Alcohol Use: No  . Drug Use: No  . Sexual Activity:    Partners: Male   Other Topics Concern  . Caffeine Concern No  . Exercise No  . Seat Belt Yes  . Self-Exams No   Social History Narrative   Outpatient Prescriptions Marked as Taking for the 07/04/14 encounter [facility] Encounter)  Medication Sig Dispense Refill  . hydrALAzine (APRESOLINE) 100 mg PO TABS TAKE 1 TABLET BY MOUTH 3 TIMES DAILY. 90 Tab 1  . spironolactone (ALDACTONE) 25 mg PO TABS TAKE 1/2  TABLET BY MOUTH ONCE A DAY. 45 Tab 0  . VITAMIN D2 50,000 unit PO CAPS TAKE 1 CAPSULE BY MOUTH EVERY 7 DAYS. 4 Cap 2  . metoprolol  (LOPRESSOR ) 100 mg PO TABS TAKE 1 TABLET BY MOUTH TWICE DAILY. 60 Tab 4  . metFORMIN (GLUCOPHAGE) 500 mg PO TABS Take 1 Tab by Mouth Twice Daily. 180 Tab 0  . sitaGLIPtin (JANUVIA) 100 mg PO TABS Take 1 Tab by Mouth Once a Day. 90 Tab 0  . atorvastatin (LIPITOR) 10 mg PO TABS TAKE 1 TABLET BY MOUTH EVERY NIGHT AT BEDTIME. 90 Tab 0  . lisinopril  (PRINIVIL ) 40 mg PO TABS Take 1 Tab by Mouth Once a Day. 90 Tab 1  . lansoprazole (PREVACID) 30 mg PO CPDR Take 1 Cap by Mouth Once a Day. 90 Cap 3  . clopidogrel (PLAVIX) 75 mg PO TABS Take 1 Tab by Mouth Once a Day. 90 Tab 1  . donepezil  (ARICEPT ) 10 mg PO TABS Take 1 Tab by Mouth Every Night at Bedtime. 30 Tab 5  . Insulin  Lispro (HUMALOG KWIKPEN) 100 unit/mL [place] insulin  pen Check sugar before meal and before bed If sugar 200-250 then 3 units If sugar 251-300 then 5 units If sugar 301-350 then 7 units If sugar 350-400 then 9 units If sugar >400 then 10 units 1 Box 3  . cyclobenzaprine (FLEXERIL) 10 mg PO TABS Take 5 mg by Mouth Every 8 Hours As Needed.     Allergies  Allergen Reactions  . Zocor [Simvastatin] muscle/joint pain    Vital Signs: Patient Vitals for the past 72 hrs:  Temp Heart Rate Resp BP BP Mean SpO2 Weight  07/04/14 1415 98.2 F (36.8 C) 57 18 145/69 mmHg 94 MM HG 97 % 74.844 kg (165 lb)     Documentation Review:  Nursing notes  Diagnostics: Labs:  No results found for this visit on 07/04/14. ECG: No results found for this visit on 07/04/14.    KNEE COMPLETE RT 4 VIEWS  Final Result  IMPRESSION:    1.  No acute abnormality.    2.  Mild joint changes.

## 2014-07-14 ENCOUNTER — Ambulatory Visit: Admit: 2014-07-14 | Payer: MEDICARE | Attending: Nurse Practitioner | Primary: Internal Medicine

## 2014-07-14 ENCOUNTER — Ambulatory Visit: Attending: Nurse Practitioner | Primary: Internal Medicine

## 2014-07-14 DIAGNOSIS — M4696 Unspecified inflammatory spondylopathy, lumbar region: Secondary | ICD-10-CM

## 2014-07-14 DIAGNOSIS — M47816 Spondylosis without myelopathy or radiculopathy, lumbar region: Secondary | ICD-10-CM

## 2014-07-14 NOTE — Patient Instructions (Addendum)
MyChart Activation    Thank you for requesting access to MyChart. Please follow the instructions below to securely access and download your online medical record. MyChart allows you to send messages to your doctor, view your test results, renew your prescriptions, schedule appointments, and more.    How Do I Sign Up?    1. In your internet browser, go to [email or web address]  2. Click on the First Time User? Click Here link in the Sign In box. You will be redirect to the New Member Sign Up page.  3. Enter your MyChart Access Code exactly as it appears below. You will not need to use this code after you???ve completed the sign-up process. If you do not sign up before the expiration date, you must request a new code.    MyChart Access Code: [email or web address]  Expires: 10/12/2014  9:01 AM (This is the date your MyChart access code will expire)    4. Enter the last four digits of your Social Security Number (xxxx) and Date of Birth (mm/dd/yyyy) as indicated and click Submit. You will be taken to the next sign-up page.  5. Create a MyChart ID. This will be your MyChart login ID and cannot be changed, so think of one that is secure and easy to remember.  6. Create a MyChart password. You can change your password at any time.  7. Enter your Password Reset Question and Answer. This can be used at a later time if you forget your password.   8. Enter your e-mail address. You will receive e-mail notification when new information is available in MyChart.  9. Click Sign Up. You can now view and download portions of your medical record.  10. Click the Download Summary menu link to download a portable copy of your medical information.    Additional Information    If you have questions, please visit the Frequently Asked Questions section of the MyChart website at [email or web address] Remember, MyChart is NOT to be used for urgent needs. For medical emergencies, dial 911.      Back Pain: Care Instructions   Your Care Instructions     Back pain has many possible causes. It is often related to problems with muscles and ligaments of the back. It may also be related to problems with the nerves, discs, or bones of the back. Moving, lifting, standing, sitting, or sleeping in an awkward way can strain the back. Sometimes you don't notice the injury until later. Arthritis is another common cause of back pain.  Although it may hurt a lot, back pain usually improves on its own within several weeks. Most people recover in 12 weeks or less. Using good home treatment and being careful not to stress your back can help you feel better sooner.  Follow-up care is a key part of your treatment and safety. Be sure to make and go to all appointments, and call your doctor if you are having problems. It???s also a good idea to know your test results and keep a list of the medicines you take.  How can you care for yourself at home?  ?? Sit or lie in positions that are most comfortable and reduce your pain. Try one of these positions when you lie down:  ?? Lie on your back with your knees bent and supported by large pillows.  ?? Lie on the floor with your legs on the seat of a sofa or chair.  ?? Lie on your side with your knees and hips bent and  a pillow between your legs.  ?? Lie on your stomach if it does not make pain worse.  ?? Do not sit up in bed, and avoid soft couches and twisted positions. Bed rest can help relieve pain at first, but it delays healing. Avoid bed rest after the first day of back pain.  ?? Change positions every 30 minutes. If you must sit for long periods of time, take breaks from sitting. Get up and walk around, or lie in a comfortable position.  ?? Try using a heating pad on a low or medium setting for 15 to 20 minutes every 2 or 3 hours. Try a warm shower in place of one session with the heating pad.  ?? You can also try an ice pack for 10 to 15 minutes every 2 to 3 hours. Put a thin cloth between the ice pack and your skin.   ?? Take pain medicines exactly as directed.  ?? If the doctor gave you a prescription medicine for pain, take it as prescribed.  ?? If you are not taking a prescription pain medicine, ask your doctor if you can take an over-the-counter medicine.  ?? Take short walks several times a day. You can start with 5 to 10 minutes, 3 or 4 times a day, and work up to longer walks. Walk on level surfaces and avoid hills and stairs until your back is better.  ?? Return to work and other activities as soon as you can. Continued rest without activity is usually not good for your back.  ?? To prevent future back pain, do exercises to stretch and strengthen your back and stomach. Learn how to use good posture, safe lifting techniques, and proper body mechanics.  When should you call for help?  Call your doctor now or seek immediate medical care if:  ?? You have new or worsening numbness in your legs.  ?? You have new or worsening weakness in your legs. (This could make it hard to stand up.)  ?? You lose control of your bladder or bowels.  Watch closely for changes in your health, and be sure to contact your doctor if:  ?? Your pain gets worse.  ?? You are not getting better after 2 weeks.   Where can you learn more?   Go to [employer]cy  Enter I594 in the search box to learn more about "Back Pain: Care Instructions."   ?? 2006-2015 Healthwise, Incorporated. Care instructions adapted under license by [employer] (which disclaims liability or warranty for this information). This care instruction is for use with your licensed healthcare professional. If you have questions about a medical condition or this instruction, always ask your healthcare professional. [place] any warranty or liability for your use of this information.  Content Version: 10.7.482551; Current as of: Nov 01, 2013

## 2014-07-14 NOTE — Progress Notes (Signed)
Chief complaint/History of Present Illness:  Chief Complaint   Patient presents with   ??? Back Pain     f/u wants to talk about block   ??? Neck Pain     HPI  Angela Potter is a  69 y.o.  female    This note was dictated.      Review of systems:    Past Medical History   Diagnosis Date   ??? Diabetes (HCC)    ??? Arthritis    ??? GERD (gastroesophageal reflux disease)    ??? Hypertension    ??? High cholesterol    ??? Osteopenia    ??? Lumbar stenosis    ??? Alzheimer's dementia    ??? Back pain      Past Surgical History   Procedure Laterality Date   ??? Pr abdomen surgery proc unlisted       Scar tissue removed from abdomen   ??? Hx hernia repair     ??? Hx hysterectomy     ??? Hx other surgical       Colon polyp removal   ??? Hx colectomy       History     Social History   ??? Marital Status: MARRIED     Spouse Name: N/A     Number of Children: N/A   ??? Years of Education: N/A     Occupational History   ??? Not on file.     Social History Main Topics   ??? Smoking status: Never Smoker    ??? Smokeless tobacco: Never Used   ??? Alcohol Use: No   ??? Drug Use: Not on file   ??? Sexual Activity: Not on file     Other Topics Concern   ??? Not on file     Social History Narrative     Family History   Problem Relation Age of Onset   ??? Alzheimer Mother    ??? Stroke Mother    ??? Hypertension Mother    ??? Diabetes Mother    ??? Elevated Lipids Mother    ??? Hypertension Father    ??? Cancer Father      Prostate   ??? Other Father      Back problems   ??? Diabetes Sister    ??? Hypertension Sister    ??? Hypertension Brother    ??? Cancer Brother      Prostate x 4 brothers       Physical Exam:  BP 164/84 mmHg   Pulse 60   Temp(Src) 96.3 ??F (35.7 ??C) (Oral)   Resp 18   Ht 5\' 3"  (1.6 m)   Wt 191 lb (86.637 kg)   BMI 33.84 kg/m2   SpO2 97%  Pain Scale: 7/10    PMP has been .reviewed and is appropriate        Angela Potter was seen today for back pain and neck pain.    Diagnoses and all orders for this visit:    Facet arthropathy, lumbar  Orders:  -     SCHEDULE SURGERY   -     REFERRAL TO PHYSICAL THERAPY    Spinal stenosis of lumbar region without neurogenic claudication  Orders:  -     SCHEDULE SURGERY  -     REFERRAL TO PHYSICAL THERAPY            Follow-up Disposition:  Return in about 1 month (around 08/12/2014) for block follow up with Dr. Jean RosenthalJackson.  We have informed Angela Potter to notify us for immediate appointment if she has any worsening neurogical symptoms or if an emergency situation presents, then call 911

## 2014-07-15 NOTE — Progress Notes (Signed)
Transcription accepted by Karilyn Wind C on 07/15/2014 at  2:36 PM  ------

## 2014-07-15 NOTE — Progress Notes (Signed)
Transcription accepted by Darshawn Boateng C on 07/15/2014 at 10:21 AM  ------  HISTORY OF PRESENT ILLNESS: The patient comes in today for follow-up of her low back pain with bilateral buttock pain.  She occasionally gets pain into the right and left calves.  She has done the [employer] method of physical therapy in the past, years ago which did help, but she did not continue with a home exercise program.  She states she is having problems with her memory and has been diagnosed with Alzheimer???s disease so she cannot remember all the moves in the [employer] program.  [place] sister and her sister wanted her to see one of the doctors there and they discussed having a block.  She is wondering if that might be an option for her.  She is diabetic.  Her blood sugar runs about 124.  She is on Plavix for a small stroke years ago.  She is retired.  She denies fever or bowel or bladder dysfunction.      PHYSICAL EXAMINATION: Ms. Angela Potter is a 69 year old female.  She is alert and oriented.  She has a full weightbearing non-antalgic gait.  She has 4/5 strength in the bilateral lower extremities.  She has pain with hyperextension of the lumbar spine.      ASSESSMENT/PLAN: This is a patient who does have facet arthropathy, mild spondylolisthesis, and some stenosis of the lumbar spine.  We are going to set her up for a L4-5 bilateral facet block and some aquatic therapy.  We will see her back following the block.

## 2014-07-15 NOTE — Progress Notes (Signed)
Transcription accepted by Aubreana Cornacchia C on 07/15/2014 at  2:36 PM  ------

## 2014-07-18 ENCOUNTER — Encounter: Primary: Internal Medicine

## 2014-07-21 NOTE — Progress Notes (Signed)
 ASSESSMENT:   1-T2DM Metformin 500mg  bid januvia 100mg  daily Foot exam today On ace/plavix/statin 2/'16 tsh, 8/'15 m/c HEMOGLOBIN A1C  Date Value Ref Range Status  07/17/2014 6.7* 4.8-5.9 % Final   -4mo f/u  2-HTN Pt's monitor 151/76 P=66 My check 132/68 As at 04/24/2014 visit again did not correlate.  Pt apparently also has a wrist monitor (not recommended) though may correlate.  Pt's clinic bps have been acceptable.  My check last visit 128/78  Lisinopril  40mg  Lopressor  100mg  bid Aldactone 12.5mg   Hydralazine 100mg  tid 2/'16 bmp  3-HPL lipitor 10mg  (on or off made no difference re: arthralgias) Lab Results  Component Value Date/Time   CHOLESTEROL 122 07/17/2014 08:40 AM   HDL 49 07/17/2014 08:40 AM   LDLIPO 59 07/17/2014 08:40 AM   TRIGLYCERIDE 72 07/17/2014 08:40 AM   4-vitamin D def Weekly high dose Lab Results  Component Value Date   VITD25OH 39.5 01/23/2014   -level next lab  5-H.M. 7/'15 mammogram  PLAN:   Discussed evaluation, treatment and usual course. Patient verbalizes understanding and agrees with plan of care. All questions were answered. Medication Orders this Encounter  Medications  . donepezil  (ARICEPT ) 10 mg PO TABS    Sig: Take 1 Tab by Mouth Every Night at Bedtime.    Dispense:  90 Tab    Refill:  1   Side effects of prescribed medicine was discussed. Orders:   Orders Placed This Encounter  Procedures  . ANNUAL FOOT EXAM  . CBC  . HGB A1C WITH EST AVG GLUCOSE  . VITAMIN D 25 HYDROXY    Smoking history/exposure to environmental tobacco history? No  The patient's medical history, medicines, and social history (including tobacco usage) were reviewed and updated as appropriate.   SUBJECTIVE:   Chief Complaint  Patient presents with  . DIABETES    DISCUSS LABS  . HYPERTENSION  . HYPERLIPIDEMIA  . DEMENTIA  69y/o AAMF in f/u re: diabetes, hypertension, lipid Forgot blood sugar and bp log had written down some bps on her  calendar: 140/80, 114/71 P=59  Denies diaphoresis/feeling weak/jittery No polydipsia/polyuria DIABETES Pertinent negatives include no chest pain, chills, coughing, fever, headaches or nausea.  HYPERTENSION Pertinent negatives include no chest pain, headaches or shortness of breath.  HYPERLIPIDEMIA Pertinent negatives include no chest pain, chills, coughing, fever, headaches or nausea.  DEMENTIA      Review of Systems  Constitutional: Negative for fever and chills.  Respiratory: Negative for cough and shortness of breath.   Cardiovascular: Negative for chest pain.       Lasting seconds 'fluttering'  Gastrointestinal: Negative for heartburn and nausea.  Genitourinary: Negative for dysuria and frequency.  Musculoskeletal:       Wednesday has a 'block' upcoming for low back for Dr Leonce Shoulder pain  Neurological: Negative for headaches.     OBJECTIVE   BP 138/76 mmHg  Pulse 72  Resp 16  Wt 84.823 kg (187 lb) BMI: 32.6   Physical Exam  Neck: No thyromegaly present.  Cardiovascular: Normal rate, regular rhythm and intact distal pulses.  Exam reveals no gallop.   No murmur heard. No carotid bruit  Pulmonary/Chest: She has no wheezes. She has no rales.  Musculoskeletal: She exhibits no edema.  Neurological: She is alert.  Monofilament intact  Psychiatric: Mood and affect normal.  Nursing note and vitals reviewed.

## 2014-07-23 ENCOUNTER — Ambulatory Visit: Admit: 2014-07-23 | Payer: MEDICARE | Primary: Internal Medicine

## 2014-07-23 ENCOUNTER — Inpatient Hospital Stay: Payer: MEDICARE

## 2014-07-23 LAB — GLUCOSE, POC: Glucose (POC): 108 mg/dL (ref 70–110)

## 2014-07-23 MED ORDER — LIDOCAINE (PF) 10 MG/ML (1 %) IJ SOLN
10 mg/mL (1 %) | INTRAMUSCULAR | Status: DC | PRN
Start: 2014-07-23 — End: 2014-07-23
  Administered 2014-07-23: 18:00:00 via INTRAVENOUS

## 2014-07-23 MED ORDER — LIDOCAINE (PF) 10 MG/ML (1 %) IJ SOLN
10 mg/mL (1 %) | INTRAMUSCULAR | Status: AC
Start: 2014-07-23 — End: ?

## 2014-07-23 MED ORDER — DEXAMETHASONE SODIUM PHOSPHATE 10 MG/ML IJ SOLN
10 mg/mL | INTRAMUSCULAR | Status: AC
Start: 2014-07-23 — End: ?

## 2014-07-23 MED ORDER — IOPAMIDOL 41 % INTRATHECAL
200 mg iodine /mL (41 %) | INTRATHECAL | Status: AC
Start: 2014-07-23 — End: ?

## 2014-07-23 MED ORDER — IOPAMIDOL 41 % INTRATHECAL
200 mg iodine /mL (41 %) | INTRATHECAL | Status: DC | PRN
Start: 2014-07-23 — End: 2014-07-23
  Administered 2014-07-23: 18:00:00

## 2014-07-23 MED ORDER — DEXAMETHASONE SODIUM PHOSPHATE 10 MG/ML IJ SOLN
10 mg/mL | INTRAMUSCULAR | Status: DC | PRN
Start: 2014-07-23 — End: 2014-07-23
  Administered 2014-07-23: 18:00:00 via INTRAVENOUS
  Administered 2014-07-23: 18:00:00

## 2014-07-23 MED ORDER — SODIUM CHLORIDE 0.9 % IJ SYRG
INTRAMUSCULAR | Status: DC | PRN
Start: 2014-07-23 — End: 2014-07-23

## 2014-07-23 MED ORDER — DIAZEPAM 5 MG TAB
5 mg | Freq: Once | ORAL | Status: AC
Start: 2014-07-23 — End: 2014-07-23
  Administered 2014-07-23: 17:00:00 via ORAL

## 2014-07-23 MED FILL — ISOVUE-M 200  41 % INTRATHECAL SOLUTION: 200 mg iodine /mL (41 %) | INTRATHECAL | Qty: 20

## 2014-07-23 MED FILL — LIDOCAINE (PF) 10 MG/ML (1 %) IJ SOLN: 10 mg/mL (1 %) | INTRAMUSCULAR | Qty: 30

## 2014-07-23 MED FILL — DEXAMETHASONE SODIUM PHOSPHATE 10 MG/ML IJ SOLN: 10 mg/mL | INTRAMUSCULAR | Qty: 3

## 2014-07-23 MED FILL — DIAZEPAM 5 MG TAB: 5 mg | ORAL | Qty: 2

## 2014-07-23 MED FILL — BD POSIFLUSH NORMAL SALINE 0.9 % INJECTION SYRINGE: INTRAMUSCULAR | Qty: 10

## 2014-07-23 NOTE — Procedures (Signed)
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 The patient was given the opportunity to ask questions regarding the procedure and its associated risks. In addition to the potential risks associated with the procedure itself, the patient was informed both verbally and in writing of potential side effects of the use of glucocorticoids. The patient appeared to comprehend the informed consent and desired to have the procedure perfored.    Procedure: The patient was placed in the prone position on the flouroscopy table and the back was prepped and draped in the usual sterile manner. At each blocked level, the exact location of the facet joint was identified with flouroscopy, and after local Lidocaine 1% injection, a #22 gauge spinal needle was then advanced toward the joint. A total of 30 mg of preservative free Dexamethasone and 5 cc of Lidocaine was injected around and equally divided among all of the sites.     The patient tolerated the procedure well. The injection area was cleaned and bandaids applied. No excessive bleeding was noted. Patient dressed and was discharged to home with instructions.       Tobie LordsHERESA G Marilouise Densmore, MD  July 23, 2014

## 2014-07-23 NOTE — Procedures (Signed)
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 The patient was given the opportunity to ask questions regarding the procedure and its associated risks. In addition to the potential risks associated with the procedure itself, the patient was informed both verbally and in writing of potential side effects of the use of glucocorticoids. The patient appeared to comprehend the informed consent and desired to have the procedure perfored.    Procedure: The patient was placed in the prone position on the flouroscopy table and the back was prepped and draped in the usual sterile manner. At each blocked level, the exact location of the facet joint was identified with flouroscopy, and after local Lidocaine 1% injection, a #22 gauge spinal needle was then advanced toward the joint. A total of 30 mg of preservative free Dexamethasone and 5 cc of Lidocaine was injected around and equally divided among all of the sites.     The patient tolerated the procedure well. The injection area was cleaned and bandaids applied. No excessive bleeding was noted. Patient dressed and was discharged to home with instructions.       [name], MD  July 23, 2014

## 2014-07-23 NOTE — H&P (Signed)
Date of Surgery Update:  Angela AmorBertha L Potter was seen and examined.  History and physical has been reviewed. The patient has been examined. There have been no significant clinical changes since the completion of the last office visit.      Signed By: Tobie LordsHERESA G Vance Belcourt, MD     July 23, 2014 12:56 PM

## 2014-08-13 ENCOUNTER — Encounter: Attending: Family Medicine | Primary: Internal Medicine

## 2014-08-21 ENCOUNTER — Ambulatory Visit: Admit: 2014-08-21 | Discharge: 2014-08-21 | Payer: MEDICARE | Attending: Family Medicine | Primary: Internal Medicine

## 2014-08-21 ENCOUNTER — Encounter: Attending: Family Medicine | Primary: Internal Medicine

## 2014-08-21 DIAGNOSIS — M47816 Spondylosis without myelopathy or radiculopathy, lumbar region: Secondary | ICD-10-CM

## 2014-08-21 NOTE — Patient Instructions (Signed)
Learning About Medial Branch Block and Neurotomy  What are medial branch block and neurotomy?     Facet joints connect your vertebrae to each other. Problems in these joints can cause chronic (long-term) pain in the neck or back. They can sometimes affect the shoulders, arms, buttocks, or legs.  Medial branch nerves are the nerves that carry many of the pain messages from your facet joints.  Radiofrequency medial branch neurotomy is a type of medial branch neurotomy that is used to relieve arthritis pain. It uses radio waves to damage nerves in your neck or back so that they can no longer send pain messages to your brain.  Before your doctor knows if a neurotomy will help you, he or she will do a medial branch block to find out if certain nerves are the ones that are a source of your pain. You will need two separate visits to the outpatient center or hospital to have both procedures.  How is a medial branch block done?  The doctor will use a tiny needle to numb the skin where you will get the block. Then he or she puts the block needle into the numbed area. You may feel some pressure, but you should not feel pain. Using fluoroscopy (live X-ray) to guide the needle, the doctor injects medicine onto one or more nerves to make them numb.  If you get relief from your pain in the next 4 to 6 hours, it's a sign that those nerves may be contributing to your pain. The relief will last only a short time. You may then have a medial branch neurotomy at a later visit to try to get longer relief.  It takes 20 to 30 minutes to get the block. You can go home after the doctor watches you for about an hour. You will get instructions on how to report how much pain you have when you are at home.  You will need someone to drive you home.  How is medial branch neurotomy done?  The doctor will use a tiny needle to numb the skin where you will get the neurotomy. Then he or she puts the neurotomy needle into the numbed area.  You may feel some pressure. Using fluoroscopy (live X-ray) to guide the needle, the doctor sends radio waves through the needle to the nerve for 60 to 90 seconds. The radio waves heat the nerve, which damages it. The doctor may do this several times. And he or she may treat more than one nerve.  It takes 45 to 90 minutes to get a neurotomy, depending on how many nerves are heated. You will probably go home 30 to 60 minutes later.  You will need someone to drive you home.  What can you expect after a neurotomy?  You may feel a little sore or tender at the injection site at first. But after a successful neurotomy, most people have pain relief right away. It often lasts for 9 to 12 months or longer. Sometimes the pain relief is permanent.  If your pain does come back, it may mean that the damaged nerve has healed and can send pain messages again. Or it can mean that a different nerve is causing pain. Your doctor will discuss your options with you.  Follow-up care is a key part of your treatment and safety. Be sure to make and go to all appointments, and call your doctor if you are having problems. It's also a good idea to know your test results   and keep a list of the medicines you take.   Where can you learn more?   Go to http://www.healthwise.net/[facility]  Enter T494 in the search box to learn more about "Learning About Medial Branch Block and Neurotomy."   ?? 2006-2015 [employer]. Care instructions adapted under license by [employer] (which disclaims liability or warranty for this information). This care instruction is for use with your licensed healthcare professional. If you have questions about a medical condition or this instruction, always ask your healthcare professional. Healthwise, Incorporated disclaims any warranty or liability for your use of this information.  Content Version: 10.7.482551; Current as of: October 04, 2013

## 2014-08-21 NOTE — Progress Notes (Signed)
[facility]  [address] [place] [address]  Phone: [phone]  Fax: [phone]          HISTORY OF PRESENT ILLNESS:  Angela Potter is a 69 y.o. female with history of lumbar pain. Pt reports pain in the right lower lumbar and in the right buttock. She denies any weakness in the bilateral lower extremities. Since her last office visit, she received bilateral L4-5 facet injections and experienced relief immediately after the procedure. Pt reports her pain started to gradually returned about 2 weeks after the procedure. She admits that her current pain has improved when compared to her level of pain prior to the injections. Pt has been prescribed Flexeril 10 mg, taking as needed, and it has been effective. She currently takes Plavix and has tried Celebrex in the past. Pt at this time desires to continue with current care.    She denies any prior lumbar surgery. Pt is retired.     Pain Scale: 2/10    PCP: Garrel Ridgel, MD       Past Medical History   Diagnosis Date   ??? Diabetes [facility])    ??? Arthritis    ??? GERD (gastroesophageal reflux disease)    ??? Hypertension    ??? High cholesterol    ??? Osteopenia    ??? Lumbar stenosis    ??? Alzheimer's dementia    ??? Back pain         History     Social History   ??? Marital Status: MARRIED     Spouse Name: N/A   ??? Number of Children: N/A   ??? Years of Education: N/A     Occupational History   ??? Not on file.     Social History Main Topics   ??? Smoking status: Never Smoker    ??? Smokeless tobacco: Never Used   ??? Alcohol Use: No   ??? Drug Use: Not on file   ??? Sexual Activity: Not on file     Other Topics Concern   ??? Not on file     Social History Narrative       Current Outpatient Prescriptions   Medication Sig Dispense Refill   ??? metFORMIN (GLUCOPHAGE) 500 mg tablet Take  by mouth two (2) times daily (with meals).     ??? sitaGLIPtin (JANUVIA) 100 mg tablet Take 100 mg by mouth daily.      ??? ergocalciferol (ERGOCALCIFEROL) 50,000 unit capsule Take 50,000 Units by mouth every seven (7) days.     ??? cyclobenzaprine (FLEXERIL) 10 mg tablet Take 0.5-1 Tabs by mouth three (3) times daily as needed for Muscle Spasm(s). 30 Tab 0   ??? lansoprazole (PREVACID) 30 mg capsule Take  by mouth Daily (before breakfast).     ??? lisinopril (PRINIVIL, ZESTRIL) 40 mg tablet Take 40 mg by mouth daily.     ??? SPIRONOLACTONE (ALDACTONE PO) Take 25 mg by mouth daily. Takes 1/2 tab daily     ??? hydrALAZINE (APRESOLINE) 100 mg tablet Take 100 mg by mouth three (3) times daily.     ??? metoprolol (LOPRESSOR) 100 mg tablet Take  by mouth two (2) times a day.     ??? atorvastatin (LIPITOR) 10 mg tablet Take 10 mg by mouth daily.     ??? clopidogrel (PLAVIX) 75 mg tablet Take  by mouth daily.     ??? donepezil (ARICEPT) 10 mg tablet Take 10 mg by mouth nightly.  No Known Allergies        REVIEW OF SYSTEMS    Constitutional: Negative for fever, chills, or weight change.   Respiratory: Negative for cough or shortness of breath.     Cardiovascular: Negative for chest pain or palpitations.  Gastrointestinal: Negative for acid reflux, change in bowel habits, or constipation.  Genitourinary: Negative for dysuria and flank pain.   Musculoskeletal: Positive for lumbar pain.   Skin: Negative for rash.   Neurological: Negative for headaches, dizziness, or numbness.  Endo/Heme/Allergies: Negative for increased bruising.   Psychiatric/Behavioral: Negative for difficulty with sleep.        PHYSICAL EXAMINATION  BP 113/71 mmHg   Pulse 67   Temp(Src) 97.2 ??F (36.2 ??C) (Oral)   Resp 18   Ht 5' 3"  (1.6 m)   Wt 175 lb (79.379 kg)   BMI 31.01 kg/m2    Constitutional: Awake, alert, and in no acute distress  Neurological: 1+ symmetrical DTRs in the lower extremities.   Musculoskeletal: 4+/5 strength with hip flexion. 4+/5 strength with knee flexion and extension. 4+/5 strength with ankle dorsiflexion. 4+/5  strength with plantar flexion. Mild pain with extension, axial loading; less pain with  forward flexion. No pain with internal or external rotation of her hips. Negative straight leg raise bilaterally.  Skin: warm, dry, and intact.       IMAGING:    Results from Hospital Encounter encounter on 03/14/13   MRI LUMB SPINE WO CONT   Narrative Sagittal and axial multisequence MR images of lumbar spine were obtained.    Mild lumbar scoliosis, left convexity. Grade 1 anterospondylolisthesis of L4 on  L5. No compression fracture or pathologic marrow signal. Conus medullaris ends  at T12 -L1 with normal morphology and signal intensity.    T12-L1: Mild posterior disc bulge with no central stenosis. No foraminal  stenosis.    L1-L2, L2-L3: No disc herniation or central stenosis. Maintained discal height  and signal intensity. No foraminal stenosis.    L3-L4: No disc herniation. Facet and ligamentous hypertrophy present with mild  triangular configuration of the canal. No significant central stenosis. No  foraminal stenosis.    L4-L5: Posterior disc bulge. Mild spondylolisthesis noted. Severe facet disease  with hypertrophy and moderate bilateral facet joint effusion. Ligamentous  thickening also present. Resulting moderate central stenosis with triangular  configuration of the canal. Mild foraminal stenosis with no exiting nerve root  compression.    L5-S1: Mild posterior disc bulge. No significant central stenosis but no  foraminal stenosis. Facets grossly unremarkable.         Impression Impression: Most severe disease is at L4-L5, with spondylolisthesis and facet  disease, central stenosis as described.          ASSESSMENT   [name] was seen today for back pain.    Diagnoses and all orders for this visit:    Facet arthropathy, lumbar    Spinal stenosis of lumbar region without neurogenic claudication           IMPRESSION AND PLAN:  Angela Potter is a 69 y.o. female with history of lumbar pain. Pt reports  pain in the right lower lumbar and in the right buttock. Since her last office visit, she received bilateral L4-5 facet injections and experienced significant relief for about 2 weeks until the pain started to gradually return. Pt was given a refill of Flexeril 10 mg 1/2-1 tab QHS prn muscle spasms. She was given information on radiofrequency ablation. Pending worsening symptoms, I  would consider another bilateral  L4-5 facet injection or a radiofrequency ablation. I recommended the pt try water exercises or tai chi. Ms. Trawick has a reminder for a "due or due soon" health maintenance. I have asked that she contact her primary care provider for follow-up on this health maintenance. PMP reviewed. Pt will follow-up in 3 months.    1) Pt was given a refill of Flexeril 10 mg 1/2-1 tab QHS prn muscle spasms.  2) She was given information on radiofrequency ablation.   3) Pending worsening symptoms, I would consider another bilateral  L4-5 facet injection or a radiofrequency ablation.  4) I recommended the pt try aqua exercises or tai chi.  5) Ms. Hulen has a reminder for a "due or due soon" health maintenance. I have asked that she contact her primary care provider for follow-up on this health maintenance.  6) PMP reviewed.  7) Pt will follow-up in 3 months.      Written by Sebastian Ache, [employer], as dictated by Delorise Shaelee Forni, MD.

## 2014-09-02 NOTE — Progress Notes (Signed)
 [facility] [address], [address] [place], [place] [address] Phone: [phone] Fax: [phone]   NEUROPSYCHOLOGICAL ASSESSMENT  This report is confidential and contains sensitive medical information. Do not release or forward without expressed consent from the patient or legal guardian. Do not release to the individual being evaluated without permission from the examiner.   Name:    Theresa Mendez Age/Date of Birth:   69 y.o., 1946-01-15 Handedness:   Right  Date of Evaluation:   09/02/2014 Referral Source:   Theresa DELENA Schneider, MD  Impressions: 1. Neurodegenerative dementia, mild 2. There was no indication of a mood disorder.    Compared to baseline neuropsychological assessment in 2014, she continued with marked weakness in memory without benefit from recognition cueing. Additionally, she continues with weakness on language tasks of confrontational naming and semantic verbal fluency. Visuomotor tracking also continues in the impaired range. A significant decline was noted on visuospatial tasks to copy a figure and judgment of line orientation. There was a 3 point decline on the MMSE and her clock drawing was currently impaired although previously fair (spatial planning and conceptual error). She missed the day of week/month and was unable to name the president. As noted by Theresa Mendez in 2014, her social graces appear well preserved in conversation, although when presented with cognitive tasks her impairment is notable.There is no report of behavioral or personality changes, however, she was unaccompanied for the visit and was the sole historian.    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 Recent neurology notes mentions subtle presence of parkinsonism which could suggest Lewy Body pathology, although there does not appear to be  fluctuations in cognition or hallucinations present at this time. Continue to follow up with neurology to manage signs and symptoms of dementia.    Recommendations:  1. I reviewed preliminary findings with Theresa Mendez. We will discuss our case in cognitive team meeting and I advised her that Dr. Schneider would get back to her if there are any changes to be made in her treatment plan prior to her next appointment.  2. She reportedly is self-sufficient in management of IADLs but due to memory problems she may be vulnerable to errors. Some supervision to ensure accuracy would be ideal. At present she has not told her husband about her diagnosis. 3. Brain health strategies (healthy diet, exercise, social activities, and mentally challenging activities) are recommended. 4. Utilize compensatory strategies as tolerated. A well established routine will likely be helpful. Tools such as calendars, reminder signs, pocket digital recorder, memory notebook, and alarm system for reminders may be helpful.  5. Return for re-evaluation as needed to rule out progression, monitor progress, and for treatment planning; deferred to Theresa Mendez judgment.    Chief complaint:  Theresa Mendez reports cognitive decline over the last few years. She reports difficulty with recent memory and recalling some details from her personal history. She feels that when she was working her problem solving skills were not good. She isn't sure how much of this is related to sleep.    She was unaccompanied and noted that she has not talked to her husband about her memory problems as she does not want to burden him.   Medical History:  She denies  a history of closed head injury, loss of consciousness, seizure, or stroke.    MRI of the brain in March 2014 per radiology read: Impression:   1. Extensive cerebral volume loss, more prominent in the frontal, parietal and temporal lobes as described above.  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  2. No acute intracranial hemorrhage. No acute infarct.   All developmental milestones were met on time.    Relevant Family medical history includes: Mother: dementia of the Alzheimer's type, diagnosed in her early 36s Maternal grandmother: probable dementia of the Alzheimer's type Paternal aunt: dementia of the Alzheimer's type  Patient Active Problem List  Diagnosis  . Type 2 diabetes mellitus, controlled (HCC)  . Breast Calcification Seen on Mammogram-right 6//'10-[facility]  . 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 DJD (degenerative joint disease), cervical-c spine CT 11/'10  . LVH (left ventricular hypertrophy)-mild concentric 1/'11 echo  . Osteopenia-1/'11 DEXA spine=-1.2, hips intact  . Vitamin D deficiency  . 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 S/P colonoscopy with polypectomy-10/05/2011 [place]  . Dementia  . Anxiety and depression  . Hyperlipidemia associated with type 2 diabetes mellitus (HCC)  . Hypertension associated with diabetes (HCC)   Home Medication List - Marked as Reviewed on 09/02/14 0831  Medication Sig  JANUVIA 100 mg PO TABS TAKE 1 TABLET BY MOUTH ONCE A DAY.  VITAMIN D2 50,000 unit PO CAPS TAKE 1 CAPSULE BY MOUTH EVERY 7 DAYS.  donepezil  (ARICEPT ) 10 mg PO TABS Take 1 Tab by Mouth Every Night at Bedtime.  atorvastatin (LIPITOR) 10 mg PO TABS TAKE 1 TABLET BY MOUTH EVERY NIGHT AT BEDTIME.  atorvastatin (LIPITOR) 10 mg PO TABS TAKE 1 TABLET BY MOUTH EVERY NIGHT AT BEDTIME.  clopidogrel (PLAVIX) 75 mg PO TABS TAKE 1 TABLET BY MOUTH ONCE A DAY.  hydrALAzine (APRESOLINE) 100 mg PO TABS TAKE 1 TABLET BY MOUTH 3 TIMES DAILY.  spironolactone (ALDACTONE) 25 mg PO TABS TAKE 1/2 TABLET BY MOUTH ONCE A DAY.  metoprolol  (LOPRESSOR ) 100 mg PO TABS TAKE 1 TABLET BY MOUTH TWICE DAILY.  lisinopril  (PRINIVIL ) 40 mg PO TABS Take 1 Tab by Mouth Once a Day.  Insulin  Pen Needles, Disposable, 31 x 3/16   Misc Ndle 1 Each by Combination route Once a Day.  lansoprazole (PREVACID) 30 mg PO CPDR Take 1 Cap by Mouth Once a Day.  Insulin  Lispro (HUMALOG KWIKPEN) 100 unit/mL [place] insulin  pen Check sugar before meal and before bed If sugar 200-250 then 3 units If sugar 251-300 then 5 units If sugar 301-350 then 7 units If sugar 350-400 then 9 units If sugar >400 then 10 units  TYPE-IN MEDICATION 1 Dose by Misc.(Non-Drug; Combo Route) route As Directed. Glucometer  Blood Sugar Diagnostic (BLOOD GLUCOSE TEST) Misc STRP 1 Each as directed As Directed.  cyclobenzaprine (FLEXERIL) 10 mg PO TABS Take 5 mg by Mouth Every 8 Hours As Needed.  Blood Sugar Diagnostic (ONE TOUCH ULTRA TEST) Misc STRP 1 Each by Misc.(Non-Drug; Combo Route) route. Use to test blood sugars 2-3 times daily  BLOOD-GLUCOSE METER (ONE TOUCH ULTRA 2 MISC) by Misc.(Non-Drug; Combo Route) route.  metFORMIN (GLUCOPHAGE) 500 mg PO TABS Take 1 Tab by Mouth Twice Daily.    Psychiatric History:  The patient has no history of formal psychiatric diagnosis or treatment. Sometimes she feels a little sad in relation to her cognitive problems. She denies thoughts of suicide or self harm. There is no history of suicidal ideation  or attempt. She denied a history of abuse, neglect, or other trauma.   All of her life, she has been high energy and never required much sleep. She sleeps 3-4 hours per night and feels rested. She had a sleep study that did not show sleep apnea but identified snoring. She denies nightmares as an adult, but had dreams as a child that were recurrent (of her falling down stairs). She does not have any unusual movements in her sleep.   The patient has a good appetite with a relatively stable weight.   She denies abuse of alcohol or illicit drugs. She does not use tobacco products. She quit in 1996.   Family psychiatric history includes: Her mother's half brother had a mental condition after serving in war. Family  history is also significant for a history of alcohol abuse in her brothers, father, maternal grandfather, and paternal grandfather. She had one brother who was dependent on drugs for a period of time.   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 She was an A, B, C student. There is no history of special educational services in school. She repeated statistics twice in summer school at the college level.  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 She held the position since about 1986. She retired because her brother (the president of the company) came to her in late 2013 or early 2014 with concerns that she could no longer manage her work (and other employees had brought this to his attention).   She lives with her husband of almost 50 years. She has one child. She is self sufficient in independent activities of daily living including:  medication management (pill box, routine), meal preparation (minimal), housekeeping, and management of personal finances.  She holds a valid driver's license and is actively driving.  On occasion she has gotten turned around when driving but this seems to be better since she has been sticking to a routine.   History of legal problems or criminal convictions was denied.    Mental Status Examination/Behavioral Observations:  She arrived as scheduled and was unaccompanied for the clinical interview. Informed consent procedures were reviewed with the patient, and written consent was obtained.  She is a well nourished [place] American female who looks to be approximately her stated age. She ambulates independently. Posture was unremarkable. Grooming is good. She was dressed appropriately. She was alert and oriented to person, place, and circumstance. She missed the day of month and week. Her mood was situationally appropriate  with a mildly constricted affect. She is pleasant interpersonally. She was attentive and cooperative. Thought stream was reality based and goal directed. Speech is fluent and articulate. She denied thoughts of suicide. She denied auditory and visual hallucinations. No evidence of psychosis. Intellect was estimated to be in the average range. Insight may be somewhat limited. Throughout the interview she made repetitive statements about her husband having a stroke, which she did not seem to realize.     Methods of Assessment:  Clinical Interview, [employer], Clock Drawing Test, Controlled Oral Word Association (F,A,S), Geriatric Depression Rating Scale (30 item), Mattis Dementia Rating Scale-2, Mini-Mental Status Examination, Neuropsychological Assessment Battery (Judgment Subtest), Repeatable Battery for the Assessment of Neuropsychological Status (Form B), Stroop Color Word Test, Trailmaking Test, Zung Anxiety Inventory  Data is available for inspection in Data Sheet addendum attached to  this encounter.   Test engagement Level of effort is analyzed in a number of ways, including the administration of neuropsychological measures that have been empirically shown to identify suboptimal effort or purposeful exaggeration.  In addition, when possible, the overall pattern of performance is analyzed for consistency between measures, consistency with the expected severity of impairment, and the presenting symptoms are compared against base rates of symptoms in other patients with similar problems.  The pattern of responses to these measures showed below expectations performance.  However, in patients with significant cognitive impairment, such a pattern can occur as a result of poor memory function.  Given her presentation is consistent on evaluation and correlates well with the history and her level of functional impairment, such is expected to be the case and the test data is believed to be valid.      This note has been sent to Theresa DELENA Schneider, MD the requesting physician, with findings and recommendations via In Basket.    Thank you for allowing me to assist you in the care of this patient.  With kind regards,  Heather CHARM Hamming, Psy.D., ABN Licensed Clinical Psychologist Diplomate, American Board of Professional Neuropsychology Neuropsychologist, [facility]  Total time spent on this evaluation:  Clinical diagnostic interview [phone]) 4 hours face to face testing, scoring, integration, and interpretation of the findings by the neuropsychologist [phone]: [id number], 3/22; [phone], 3/24; [id number], 3/25)

## 2014-10-15 NOTE — Telephone Encounter (Signed)
 Pt getting from different pharmacy

## 2014-10-30 NOTE — Progress Notes (Addendum)
 ASSESSMENT:   1-T2DM Metformin 500mg  bid januvia 100mg  dialy 2/'16 foot exam On ace/plavix/statin 2/'16 tsh, 8/'15 m/c HEMOGLOBIN A1C  Date Value Ref Range Status  10/20/2014 6.8* 4.8-5.9 % Final   Pt does have [employer] membership-recommended water aerobics -3 mo f/u  2-HTN Lisinopril  40mg  Aldactone 12.5mg  Hydralazine 100mg  tid Vitals 07/04/2014 07/21/2014 10/30/2014  BP 147/74 138/76 142/78  2/'16 bmp  3-HPL lipitor 10mg  (on or off made no difference re: arthralgias) Lab Results  Component Value Date/Time   CHOLESTEROL 122 07/17/2014 08:40 AM   HDL 49 07/17/2014 08:40 AM   LDL CALCULATION 59 07/17/2014 08:40 AM   TRIGLYCERIDE 72 07/17/2014 08:40 AM   4-vitamin D def Lab Results  Component Value Date   VITD25OH 34.1 10/20/2014   5-H.M. 7/'15 mammogram Advanced directives in chart PLAN:   Discussed evaluation, treatment and usual course. Patient verbalizes understanding and agrees with plan of care. All questions were answered. Side effects of prescribed medicine was discussed. Orders:   Orders Placed This Encounter  Procedures  . HGB A1C WITH EST AVG GLUCOSE  . CBC  . BASIC METABOLIC PANEL  . MICROALBUMIN RANDOM URINE    Smoking history - Is there current tobacco use, second hand tobacco exposure, or environmental exposure? No   The patient's medicines, past medical, family and social histories (including tobacco usage) were reviewed and updated as appropriate.   SUBJECTIVE:   Chief Complaint  Patient presents with  . DIABETES  . HYPERTENSION  . HYPERLIPIDEMIA  . DEPRESSION   69y/o AAMF in f/u re: diabetes, hypertension Unable to walk due to back pain -hasn't contacted Dr Leonce yet.  Uses cane-right side/hip 'catches'. Fasting sugar this AM 145 Denies feeling jittery, diaphoretic/weak No polyuria/polydipsia  Blood pressure this AM 145/80, however bp monitor did not correlate at 2/8 visit  DIABETES Pertinent negatives include no chest pain, chills,  coughing, fever, headaches or nausea.  HYPERTENSION Pertinent negatives include no chest pain, headaches or shortness of breath.  HYPERLIPIDEMIA Pertinent negatives include no chest pain, chills, coughing, fever, headaches or nausea.  DEPRESSION      Review of Systems  Constitutional: Negative for fever and chills.  Respiratory: Negative for cough and shortness of breath.   Cardiovascular: Negative for chest pain and leg swelling.       Fluttering in chest-seconds in the AM if moving around a lot-no SOB  Gastrointestinal: Negative for heartburn and nausea.  Neurological: Negative for dizziness and headaches.     OBJECTIVE   BP 142/78 mmHg  Pulse 74  Resp 16  Ht 5' 3.5 (1.613 m)  Wt 87.998 kg (194 lb)  BMI 33.82 kg/m2 BMI: 33.82   Physical Exam  Neck: No thyromegaly present.  Cardiovascular: Normal rate and regular rhythm.  Exam reveals no gallop.   No murmur heard. No carotid bruit  Pulmonary/Chest: She has no wheezes. She has no rales.  Musculoskeletal: She exhibits no edema.  Neurological: She is alert.  Psychiatric: Affect normal.  Nursing note and vitals reviewed.

## 2014-12-01 ENCOUNTER — Ambulatory Visit: Admit: 2014-12-01 | Payer: MEDICARE | Attending: Family Medicine | Primary: Internal Medicine

## 2014-12-01 DIAGNOSIS — M47816 Spondylosis without myelopathy or radiculopathy, lumbar region: Secondary | ICD-10-CM

## 2014-12-01 MED ORDER — CYCLOBENZAPRINE 10 MG TAB
10 mg | ORAL_TABLET | ORAL | Status: DC
Start: 2014-12-01 — End: 2015-06-01

## 2014-12-01 NOTE — Patient Instructions (Addendum)
Learning About Medial Branch Block and Neurotomy  What are medial branch block and neurotomy?     Facet joints connect your vertebrae to each other. Problems in these joints can cause chronic (long-term) pain in the neck or back. They can sometimes affect the shoulders, arms, buttocks, or legs.  Medial branch nerves are the nerves that carry many of the pain messages from your facet joints.  Radiofrequency medial branch neurotomy is a type of medial branch neurotomy that is used to relieve arthritis pain. It uses radio waves to damage nerves in your neck or back so that they can no longer send pain messages to your brain.  Before your doctor knows if a neurotomy will help you, he or she will do a medial branch block to find out if certain nerves are the ones that are a source of your pain. You will need two separate visits to the outpatient center or hospital to have both procedures.  How is a medial branch block done?  The doctor will use a tiny needle to numb the skin where you will get the block. Then he or she puts the block needle into the numbed area. You may feel some pressure, but you should not feel pain. Using fluoroscopy (live X-ray) to guide the needle, the doctor injects medicine onto one or more nerves to make them numb.  If you get relief from your pain in the next 4 to 6 hours, it's a sign that those nerves may be contributing to your pain. The relief will last only a short time. You may then have a medial branch neurotomy at a later visit to try to get longer relief.  It takes 20 to 30 minutes to get the block. You can go home after the doctor watches you for about an hour. You will get instructions on how to report how much pain you have when you are at home.  You will need someone to drive you home.  How is medial branch neurotomy done?  The doctor will use a tiny needle to numb the skin where you will get the neurotomy. Then he or she puts the neurotomy needle into the numbed area.  You may feel some pressure. Using fluoroscopy (live X-ray) to guide the needle, the doctor sends radio waves through the needle to the nerve for 60 to 90 seconds. The radio waves heat the nerve, which damages it. The doctor may do this several times. And he or she may treat more than one nerve.  It takes 45 to 90 minutes to get a neurotomy, depending on how many nerves are heated. You will probably go home 30 to 60 minutes later.  You will need someone to drive you home.  What can you expect after a neurotomy?  You may feel a little sore or tender at the injection site at first. But after a successful neurotomy, most people have pain relief right away. It often lasts for 9 to 12 months or longer. Sometimes the pain relief is permanent.  If your pain does come back, it may mean that the damaged nerve has healed and can send pain messages again. Or it can mean that a different nerve is causing pain. Your doctor will discuss your options with you.  Follow-up care is a key part of your treatment and safety. Be sure to make and go to all appointments, and call your doctor if you are having problems. It's also a good idea to know your test results   and keep a list of the medicines you take.  Where can you learn more?  Go to [employer]  Enter T494 in the search box to learn more about "Learning About Medial Branch Block and Neurotomy."  ?? 2006-2016 [employer]. Care instructions adapted under license by [employer] (which disclaims liability or warranty for this information). This care instruction is for use with your licensed healthcare professional. If you have questions about a medical condition or this instruction, always ask your healthcare professional. [employer], Incorporated disclaims any warranty or liability for your use of this information.  Content Version: 10.9.538570; Current as of: August 01, 2014      Low Back Arthritis: Exercises   Your Care Instructions  Here are some examples of typical rehabilitation exercises for your condition. Start each exercise slowly. Ease off the exercise if you start to have pain.  Your doctor or physical therapist will tell you when you can start these exercises and which ones will work best for you.  When you are not being active, find a comfortable position for rest. Some people are comfortable on the floor or a medium-firm bed with a small pillow under their head and another under their knees. Some people prefer to lie on their side with a pillow between their knees. Don't stay in one position for too long.  Take short walks (10 to 20 minutes) every 2 to 3 hours. Avoid slopes, hills, and stairs until you feel better. Walk only distances you can manage without pain, especially leg pain.  How to do the exercises  Pelvic tilt    1. Lie on your back with your knees bent.  2. "Brace" your stomach???tighten your muscles by pulling in and imagining your belly button moving toward your spine.  3. Press your lower back into the floor. You should feel your hips and pelvis rock back.  4. Hold for 6 seconds while breathing smoothly.  5. Relax and allow your pelvis and hips to rock forward.  6. Repeat 8 to 12 times.  Back stretches    1. Get down on your hands and knees on the floor.  2. Relax your head and allow it to droop. Round your back up toward the ceiling until you feel a nice stretch in your upper, middle, and lower back. Hold this stretch for as long as it feels comfortable, or about 15 to 30 seconds.  3. Return to the starting position with a flat back while you are on your hands and knees.  4. Let your back sway by pressing your stomach toward the floor. Lift your buttocks toward the ceiling.  5. Hold this position for 15 to 30 seconds.  6. Repeat 2 to 4 times.  Follow-up care is a key part of your treatment and safety. Be sure to make and go to all appointments, and call your doctor if you are having  problems. It's also a good idea to know your test results and keep a list of the medicines you take.  Where can you learn more?  Go to [employer]  Enter T094 in the search box to learn more about "Low Back Arthritis: Exercises."  ?? 2006-2016 [employer]. Care instructions adapted under license by [employer] (which disclaims liability or warranty for this information). This care instruction is for use with your licensed healthcare professional. If you have questions about a medical condition or this instruction, always ask your healthcare professional. [employer]wise, Inc[employer] disclaims any warranty or liability for your  use of this information.  Content Version: 10.9.538570; Current as of: Nov 01, 2013

## 2014-12-01 NOTE — Progress Notes (Signed)
[facility]  [address], [address]  [place], [place] [address]  Phone: [phone]  Fax: [phone]          HISTORY OF PRESENT ILLNESS:  Angela Potter is a 69 y.o. female with history of lumbar pain. Pt has tried facet injections in the past and experienced significant relief. She reports that she continued her membership at the [facility] and has considered starting a water aerobics class. Pt has been prescribed Flexeril but states that she has not taken it in over a year and has ran out. She continues to take Plavix. Pt at this time desires to proceed with information on a RFA and a refill of Flexeril.      Pain Scale: 6/10    PCP: Freeman Caldron, MD       Past Medical History   Diagnosis Date   ??? Diabetes (HCC)    ??? Arthritis    ??? GERD (gastroesophageal reflux disease)    ??? Hypertension    ??? High cholesterol    ??? Osteopenia    ??? Lumbar stenosis    ??? Alzheimer's dementia    ??? Back pain         History     Social History   ??? Marital Status: MARRIED     Spouse Name: N/A   ??? Number of Children: N/A   ??? Years of Education: N/A     Occupational History   ??? Not on file.     Social History Main Topics   ??? Smoking status: Never Smoker    ??? Smokeless tobacco: Never Used   ??? Alcohol Use: No   ??? Drug Use: No   ??? Sexual Activity: Not on file     Other Topics Concern   ??? Not on file     Social History Narrative       Current Outpatient Prescriptions   Medication Sig Dispense Refill   ??? cyclobenzaprine (FLEXERIL) 10 mg tablet 1 po q pm prn spasm 30 Tab 2   ??? metFORMIN (GLUCOPHAGE) 500 mg tablet Take  by mouth two (2) times daily (with meals).     ??? sitaGLIPtin (JANUVIA) 100 mg tablet Take 100 mg by mouth daily.     ??? ergocalciferol (ERGOCALCIFEROL) 50,000 unit capsule Take 50,000 Units by mouth every seven (7) days.     ??? lansoprazole (PREVACID) 30 mg capsule Take  by mouth Daily (before breakfast).     ??? lisinopril (PRINIVIL, ZESTRIL) 40 mg tablet Take 40 mg by mouth daily.      ??? SPIRONOLACTONE (ALDACTONE PO) Take 25 mg by mouth daily. Takes 1/2 tab daily     ??? hydrALAZINE (APRESOLINE) 100 mg tablet Take 100 mg by mouth three (3) times daily.     ??? metoprolol (LOPRESSOR) 100 mg tablet Take  by mouth two (2) times a day.     ??? atorvastatin (LIPITOR) 10 mg tablet Take 10 mg by mouth daily.     ??? clopidogrel (PLAVIX) 75 mg tablet Take  by mouth daily.     ??? donepezil (ARICEPT) 10 mg tablet Take 10 mg by mouth nightly.         No Known Allergies        REVIEW OF SYSTEMS    Constitutional: Negative for fever, chills, or weight change.   Respiratory: Negative for cough or shortness of breath.     Cardiovascular: Negative for chest pain or palpitations.  Gastrointestinal: Negative for  acid reflux, change in bowel habits, or constipation.  Genitourinary: Negative for dysuria and flank pain.   Musculoskeletal: Positive for lumbar pain.  Skin: Negative for rash.   Neurological: Negative for headaches, dizziness, or numbness.  Endo/Heme/Allergies: Negative for increased bruising.   Psychiatric/Behavioral: Negative for difficulty with sleep.        PHYSICAL EXAMINATION  BP 102/57 mmHg   Pulse 63   Temp(Src) 97.7 ??F (36.5 ??C) (Oral)   Resp 16   Ht  (1.6 m)   Wt 171 lb (77.565 kg)   BMI 30.30 kg/m2    Constitutional: Awake, alert, and in no acute distress  Neurological: 1+ symmetrical DTRs in the lower extremities.   Musculoskeletal: 4+/5 strength with hip flexion. 4+/5 strength with knee flexion and extension. 4+/5 strength with ankle dorsiflexion. 4+/5 strength with plantar flexion. Tender to palpation in the lower lumbar region. Moderate pain with extension and axial loading. Better with forward flexion. No pain with internal or external rotation of her hips. Negative straight leg raise bilaterally. Pt ambulates with the assistance of a single point cane.  Skin: warm, dry, and intact.       IMAGING:    Lumbar Spine MRI from 03/14/2013 was personally reviewed with the Pt and demonstrated:     Results from Hospital Encounter encounter on 03/14/13   MRI LUMB SPINE WO CONT   Narrative Sagittal and axial multisequence MR images of lumbar spine were obtained.    Mild lumbar scoliosis, left convexity. Grade 1 anterospondylolisthesis of L4 on  L5. No compression fracture or pathologic marrow signal. Conus medullaris ends  at T12 -L1 with normal morphology and signal intensity.    T12-L1: Mild posterior disc bulge with no central stenosis. No foraminal  stenosis.    L1-L2, L2-L3: No disc herniation or central stenosis. Maintained discal height  and signal intensity. No foraminal stenosis.    L3-L4: No disc herniation. Facet and ligamentous hypertrophy present with mild  triangular configuration of the canal. No significant central stenosis. No  foraminal stenosis.    L4-L5: Posterior disc bulge. Mild spondylolisthesis noted. Severe facet disease  with hypertrophy and moderate bilateral facet joint effusion. Ligamentous  thickening also present. Resulting moderate central stenosis with triangular  configuration of the canal. Mild foraminal stenosis with no exiting nerve root  compression.    L5-S1: Mild posterior disc bulge. No significant central stenosis but no  foraminal stenosis. Facets grossly unremarkable.         Impression Impression: Most severe disease is at L4-L5, with spondylolisthesis and facet  disease, central stenosis as described.             ASSESSMENT   Hellen was seen today for back pain.    Diagnoses and all orders for this visit:    Facet arthropathy, lumbar  Orders:  -     cyclobenzaprine (FLEXERIL) 10 mg tablet; 1 po q pm prn spasm    Muscle spasm of back  Orders:  -     cyclobenzaprine (FLEXERIL) 10 mg tablet; 1 po q pm prn spasm    Spinal stenosis of lumbar region without neurogenic claudication  Orders:  -     cyclobenzaprine (FLEXERIL) 10 mg tablet; 1 po q pm prn spasm           IMPRESSION AND PLAN:  Angela Potter is a 69 y.o. female with history of lumbar pain. Pt has  tried facet injections in the past and experienced significant relief. She reports that  she has continue her membership at the [facility] and has considered starting a water aerobics class. Pt has been prescribed Flexeril but states that she has not taken it in over a year and has ran out. She continues to take Plavix. Pt at this time desires to proceed with information on a RFA. Pt was given information on radiofrequency ablation. She was also given information on home lumbar arthritis exercises. I encouraged the Pt try water aerobics classes for at least 1 month. Pt was given a refill of Flexeril 10 mg 1 tab QHS prn. Pending improvement in symptoms with water exercise, I would consider a facet injection at L4-5. Ms. Frigon has a reminder for a "due or due soon" health maintenance. I have asked that she contact her primary care provider for follow-up on this health maintenance. PMP reviewed. Pt will follow-up in 3 months.    1) Pt was given information on radiofrequency ablation.  2) She was also given information on home lumbar arthritis exercises.  3) I encouraged the Pt try water aerobics classes for at least 1 month.  4) Pt was given a refill of Flexeril 10 mg 1 tab QHS prn.  5) Pending improvement in symptoms with water exercise, I would consider a facet injection at L4-5.  6) Ms. Berish has a reminder for a "due or due soon" health maintenance. I have asked that she contact her primary care provider for follow-up on this health maintenance.  7) PMP reviewed.  8) Pt will follow-up in 3 months.      Written by Robinette Haines, [employer], as dictated by Delrae Sawyers, MD.

## 2015-03-02 ENCOUNTER — Ambulatory Visit: Admit: 2015-03-02 | Discharge: 2015-03-03 | Payer: MEDICARE | Attending: Family Medicine | Primary: Internal Medicine

## 2015-03-02 ENCOUNTER — Ambulatory Visit: Attending: Family Medicine | Primary: Internal Medicine

## 2015-03-02 DIAGNOSIS — G479 Sleep disorder, unspecified: Secondary | ICD-10-CM | POA: Insufficient documentation

## 2015-03-02 DIAGNOSIS — M4696 Unspecified inflammatory spondylopathy, lumbar region: Secondary | ICD-10-CM

## 2015-03-02 DIAGNOSIS — M47816 Spondylosis without myelopathy or radiculopathy, lumbar region: Secondary | ICD-10-CM

## 2015-03-02 NOTE — Patient Instructions (Addendum)
Learning About Sleeping Well  What does sleeping well mean?  Sleeping well means getting enough sleep. How much sleep is enough varies among people.  The number of hours you sleep is not as important as how you feel when you wake up. If you do not feel refreshed, you probably need more sleep. Another sign of not getting enough sleep is feeling tired during the day.  The average total nightly sleep time is 7?? to 8 hours. Healthy adults may need a little more or a little less than this.  Why is getting enough sleep important?  Getting enough quality sleep is a basic part of good health. When your sleep suffers, your mood and your thoughts can suffer too. You may find yourself feeling more grumpy or stressed. Not getting enough sleep also can lead to serious problems, including injury, accidents, anxiety, and depression.  What might cause poor sleeping?  Many things can cause sleep problems, including:  ?? Stress. Stress can be caused by fear about a single event, such as giving a speech. Or you may have ongoing stress, such as worry about work or school.  ?? Depression, anxiety, and other mental or emotional conditions.  ?? Changes in your sleep habits or surroundings. This includes changes that happen where you sleep, such as noise, light, or sleeping in a different bed. It also includes changes in your sleep pattern, such as having jet lag or working a late shift.  ?? Health problems, such as pain, breathing problems, and restless legs syndrome.  ?? Lack of regular exercise.  How can you help yourself?  Here are some tips that may help you sleep more soundly and wake up feeling more refreshed.  Your sleeping area   ?? Use your bedroom only for sleeping and sex. A bit of light reading may help you fall asleep. But if it doesn't, do your reading elsewhere in the house. Don't watch TV in bed.  ?? Be sure your bed is big enough to stretch out comfortably, especially if you have a sleep partner.   ?? Keep your bedroom quiet, dark, and cool. Use curtains, blinds, or a sleep mask to block out light. To block out noise, use earplugs, soothing music, or a "Pack noise" machine.  Your evening and bedtime routine   ?? Create a relaxing bedtime routine. You might want to take a warm shower or bath, listen to soothing music, or drink a cup of noncaffeinated tea.  ?? Go to bed at the same time every night. And get up at the same time every morning, even if you feel tired.  What to avoid   ?? Limit caffeine (coffee, tea, caffeinated sodas) during the day, and don't have any for at least 4 to 6 hours before bedtime.  ?? Don't drink alcohol before bedtime. Alcohol can cause you to wake up more often during the night.  ?? Don't smoke or use tobacco, especially in the evening. Nicotine can keep you awake.  ?? Don't take naps during the day, especially close to bedtime.  ?? Don't lie in bed awake for too long. If you can't fall asleep, or if you wake up in the middle of the night and can't get back to sleep within 15 minutes or so, get out of bed and go to another room until you feel sleepy.  ?? Don't take medicine right before bed that may keep you awake or make you feel hyper or energized. Your doctor can tell you if your   medicine may do this and if you can take it earlier in the day.  If you can't sleep   ?? Imagine yourself in a peaceful, pleasant scene. Focus on the details and feelings of being in a place that is relaxing.  ?? Get up and do a quiet or boring activity until you feel sleepy.  ?? Don't drink any liquids after 6 p.m. if you wake up often because you have to go to the bathroom.  Where can you learn more?  Go to [employer]  Enter 228-477-5587 in the search box to learn more about "Learning About Sleeping Well."  ?? 2006-2016 [employer]. Care instructions adapted under license by [employer] (which disclaims liability or warranty  for this information). This care instruction is for use with your licensed healthcare professional. If you have questions about a medical condition or this instruction, always ask your healthcare professional. [employer] disclaims any warranty or liability for your use of this information.  Content Version: 10.9.538570; Current as of: June 03, 2014        Low Back Arthritis: Exercises  Your Care Instructions  Here are some examples of typical rehabilitation exercises for your condition. Start each exercise slowly. Ease off the exercise if you start to have pain.  Your doctor or physical therapist will tell you when you can start these exercises and which ones will work best for you.  When you are not being active, find a comfortable position for rest. Some people are comfortable on the floor or a medium-firm bed with a small pillow under their head and another under their knees. Some people prefer to lie on their side with a pillow between their knees. Don't stay in one position for too long.  Take short walks (10 to 20 minutes) every 2 to 3 hours. Avoid slopes, hills, and stairs until you feel better. Walk only distances you can manage without pain, especially leg pain.  How to do the exercises  Pelvic tilt    1. Lie on your back with your knees bent.  2. "Brace" your stomach???tighten your muscles by pulling in and imagining your belly button moving toward your spine.  3. Press your lower back into the floor. You should feel your hips and pelvis rock back.  4. Hold for 6 seconds while breathing smoothly.  5. Relax and allow your pelvis and hips to rock forward.  6. Repeat 8 to 12 times.  Back stretches    1. Get down on your hands and knees on the floor.  2. Relax your head and allow it to droop. Round your back up toward the ceiling until you feel a nice stretch in your upper, middle, and lower back. Hold this stretch for as long as it feels comfortable, or about 15 to 30 seconds.   3. Return to the starting position with a flat back while you are on your hands and knees.  4. Let your back sway by pressing your stomach toward the floor. Lift your buttocks toward the ceiling.  5. Hold this position for 15 to 30 seconds.  6. Repeat 2 to 4 times.  Follow-up care is a key part of your treatment and safety. Be sure to make and go to all appointments, and call your doctor if you are having problems. It's also a good idea to know your test results and keep a list of the medicines you take.  Where can you learn more?  Go to [employer]  Enter (830)[phone]  in the search box to learn more about "Low Back Arthritis: Exercises."  ?? 2006-2016 [employer]. Care instructions adapted under license by [employer] (which disclaims liability or warranty for this information). This care instruction is for use with your licensed healthcare professional. If you have questions about a medical condition or this instruction, always ask your healthcare professional. [employer] disclaims any warranty or liability for your use of this information.  Content Version: 10.9.538570; Current as of: Nov 01, 2013        Learning About Medial Branch Block and Neurotomy  What are medial branch block and neurotomy?     Facet joints connect your vertebrae to each other. Problems in these joints can cause chronic (long-term) pain in the neck or back. They can sometimes affect the shoulders, arms, buttocks, or legs.  Medial branch nerves are the nerves that carry many of the pain messages from your facet joints.  Radiofrequency medial branch neurotomy is a type of medial branch neurotomy that is used to relieve arthritis pain. It uses radio waves to damage nerves in your neck or back so that they can no longer send pain messages to your brain.  Before your doctor knows if a neurotomy will help you, he or she will do a  medial branch block to find out if certain nerves are the ones that are a source of your pain. You will need two separate visits to the outpatient center or hospital to have both procedures.  How is a medial branch block done?  The doctor will use a tiny needle to numb the skin where you will get the block. Then he or she puts the block needle into the numbed area. You may feel some pressure, but you should not feel pain. Using fluoroscopy (live X-ray) to guide the needle, the doctor injects medicine onto one or more nerves to make them numb.  If you get relief from your pain in the next 4 to 6 hours, it's a sign that those nerves may be contributing to your pain. The relief will last only a short time. You may then have a medial branch neurotomy at a later visit to try to get longer relief.  It takes 20 to 30 minutes to get the block. You can go home after the doctor watches you for about an hour. You will get instructions on how to report how much pain you have when you are at home.  You will need someone to drive you home.  How is medial branch neurotomy done?  The doctor will use a tiny needle to numb the skin where you will get the neurotomy. Then he or she puts the neurotomy needle into the numbed area. You may feel some pressure. Using fluoroscopy (live X-ray) to guide the needle, the doctor sends radio waves through the needle to the nerve for 60 to 90 seconds. The radio waves heat the nerve, which damages it. The doctor may do this several times. And he or she may treat more than one nerve.  It takes 45 to 90 minutes to get a neurotomy, depending on how many nerves are heated. You will probably go home 30 to 60 minutes later.  You will need someone to drive you home.  What can you expect after a neurotomy?  You may feel a little sore or tender at the injection site at first. But after a successful neurotomy, most people have pain relief right away. It  often lasts for 9  to 12 months or longer. Sometimes the pain relief is permanent.  If your pain does come back, it may mean that the damaged nerve has healed and can send pain messages again. Or it can mean that a different nerve is causing pain. Your doctor will discuss your options with you.  Follow-up care is a key part of your treatment and safety. Be sure to make and go to all appointments, and call your doctor if you are having problems. It's also a good idea to know your test results and keep a list of the medicines you take.  Where can you learn more?  Go to [employer]  Enter T494 in the search box to learn more about "Learning About Medial Branch Block and Neurotomy."  ?? 2006-2016 [employer]. Care instructions adapted under license by [employer] (which disclaims liability or warranty for this information). This care instruction is for use with your licensed healthcare professional. If you have questions about a medical condition or this instruction, always ask your healthcare professional. [employer] disclaims any warranty or liability for your use of this information.  Content Version: 10.9.538570; Current as of: August 01, 2014

## 2015-03-02 NOTE — Progress Notes (Signed)
[facility]  [address], [address]  [place], [place] [address]  Phone: [phone]  Fax: [phone]          HISTORY OF PRESENT ILLNESS:  Angela Potter is a 69 y.o. female with history of lumbar pain. She continues to experience intermittent lower back pain. She has gotten relief with facet injections in the past but the pain has returned. Pt experiences some relief with OTC topical cream. Pt c/o pain in the buttocks when sitting. She reports difficulty sleeping at night but admits that she does not have a regular bedtime routine. Pt generally watches TV until she falls asleep in the couch and stays up until 2:00-3:00 AM. She does not take naps during the day. Pt has been prescribed Flexeril 10 mg but she rarely takes it since she is already taking multiple medications. She currently takes Glucophage 1 tab QAM and 1 tab QHS and is on Plavix. Pt at this time desires to proceed with a referral to Dr. Elio Forget for a RFA.    Of note, Pt had dental implants placed years ago and states that they are failing due to bone loss in her jaw.     Pain Scale: 4/10    PCP: Freeman Caldron, MD       Past Medical History   Diagnosis Date   ??? Alzheimer's dementia    ??? Arthritis    ??? Back pain    ??? Diabetes (HCC)    ??? GERD (gastroesophageal reflux disease)    ??? High cholesterol    ??? Hypertension    ??? Lumbar stenosis    ??? Osteopenia         Social History     Social History   ??? Marital status: MARRIED     Spouse name: N/A   ??? Number of children: N/A   ??? Years of education: N/A     Occupational History   ??? Not on file.     Social History Main Topics   ??? Smoking status: Never Smoker   ??? Smokeless tobacco: Never Used   ??? Alcohol use No   ??? Drug use: No   ??? Sexual activity: Not on file     Other Topics Concern   ??? Not on file     Social History Narrative       Current Outpatient Prescriptions   Medication Sig Dispense Refill    ??? cyclobenzaprine (FLEXERIL) 10 mg tablet 1 po q pm prn spasm 30 Tab 2   ??? metFORMIN (GLUCOPHAGE) 500 mg tablet Take  by mouth two (2) times daily (with meals).     ??? sitaGLIPtin (JANUVIA) 100 mg tablet Take 100 mg by mouth daily.     ??? ergocalciferol (ERGOCALCIFEROL) 50,000 unit capsule Take 50,000 Units by mouth every seven (7) days.     ??? lansoprazole (PREVACID) 30 mg capsule Take  by mouth Daily (before breakfast).     ??? lisinopril (PRINIVIL, ZESTRIL) 40 mg tablet Take 40 mg by mouth daily.     ??? SPIRONOLACTONE (ALDACTONE PO) Take 25 mg by mouth daily. Takes 1/2 tab daily     ??? hydrALAZINE (APRESOLINE) 100 mg tablet Take 100 mg by mouth three (3) times daily.     ??? metoprolol (LOPRESSOR) 100 mg tablet Take  by mouth two (2) times a day.     ??? atorvastatin (LIPITOR) 10 mg tablet Take 10 mg by mouth daily.     ??? clopidogrel (  PLAVIX) 75 mg tablet Take  by mouth daily.     ??? donepezil (ARICEPT) 10 mg tablet Take 10 mg by mouth nightly.         No Known Allergies      REVIEW OF SYSTEMS    Constitutional: Negative for fever, chills, or weight change.   Respiratory: Negative for cough or shortness of breath.     Cardiovascular: Negative for chest pain or palpitations.  Gastrointestinal: Negative for acid reflux, change in bowel habits, or constipation.  Genitourinary: Negative for dysuria and flank pain.   Musculoskeletal: Positive for lumbar pain.  Skin: Negative for rash.   Neurological: Negative for headaches, dizziness, or numbness.  Endo/Heme/Allergies: Negative for increased bruising.   Psychiatric/Behavioral: Positive for difficulty with sleep.      PHYSICAL EXAMINATION  Visit Vitals   ??? BP 127/66   ??? Pulse 70   ??? Temp 98 ??F (36.7 ??C) (Oral)   ??? Resp 18   ??? Ht  (1.6 m)   ??? Wt 195 lb 3.2 oz (88.5 kg)   ??? SpO2 97%   ??? BMI 34.58 kg/m2       Constitutional: Awake, alert, and in no acute distress  Neurological: 1+ symmetrical DTRs in the lower extremities. Sensation to light touch is intact.    Skin: warm, dry, and intact.   Musculoskeletal: Tenderness to palpation in the right lower lumbar region. Pain with extension and right sided axial loading. Better with forward flexion. No pain with internal or external rotation of her hips. Negative straight leg raise bilaterally.     Hip Flex  Quads Hamstrings Ankle DF EHL Ankle PF   Right +4/5 +4/5 +4/5 +4/5 +4/5 +4/5   Left +4/5 +4/5 +4/5 +4/5 +4/5 +4/5     IMAGING:    Lumbar Spine MRI from 03/14/2013 was personally reviewed with the Pt and demonstrated:    Results from Hospital Encounter encounter on 03/14/13   MRI LUMB SPINE WO CONT   Narrative Sagittal and axial multisequence MR images of lumbar spine were obtained.    Mild lumbar scoliosis, left convexity. Grade 1 anterospondylolisthesis of L4 on  L5. No compression fracture or pathologic marrow signal. Conus medullaris ends  at T12 -L1 with normal morphology and signal intensity.    T12-L1: Mild posterior disc bulge with no central stenosis. No foraminal  stenosis.    L1-L2, L2-L3: No disc herniation or central stenosis. Maintained discal height  and signal intensity. No foraminal stenosis.    L3-L4: No disc herniation. Facet and ligamentous hypertrophy present with mild  triangular configuration of the canal. No significant central stenosis. No  foraminal stenosis.    L4-L5: Posterior disc bulge. Mild spondylolisthesis noted. Severe facet disease  with hypertrophy and moderate bilateral facet joint effusion. Ligamentous  thickening also present. Resulting moderate central stenosis with triangular  configuration of the canal. Mild foraminal stenosis with no exiting nerve root  compression.    L5-S1: Mild posterior disc bulge. No significant central stenosis but no  foraminal stenosis. Facets grossly unremarkable.         Impression Impression: Most severe disease is at L4-L5, with spondylolisthesis and facet  disease, central stenosis as described.          ASSESSMENT    Angela Potter was seen today for follow-up and back pain.    Diagnoses and all orders for this visit:    Facet arthropathy, lumbar  -     REFERRAL TO PAIN MANAGEMENT  Muscle spasm of back  -     REFERRAL TO PAIN MANAGEMENT    Spinal stenosis of lumbar region without neurogenic claudication  -     REFERRAL TO PAIN MANAGEMENT    Sleep disorder  -     REFERRAL TO PAIN MANAGEMENT         IMPRESSION AND PLAN:  Angela Potter is a 69 y.o. female with history of lumbar pain. She continues to experience intermittent lower back pain. Pt experiences some relief with OTC topical cream. Pt was given information on home lumbar arthritis exercises. She also received information on sleep hygiene.  Discussed treatment options with the Pt, including an RFA. Pt was referred to Dr. Elio Forget for RFA evaluation at L3, L4, and L5. She was given information on radiofrequency ablation. I recommended the Pt have thyroid and parathyroid workup regarding her recent bone loss. Angela Potter has a reminder for a "due or due soon" health maintenance. I have asked that she contact her primary care provider for follow-up on this health maintenance. PMP reviewed. Pt will follow-up in 3 months.    1) Pt was given information on home lumbar arthritis exercises.   2) She also received information on sleep hygiene.   3) Discussed treatment options with the Pt, including an RFA.  4) Pt was referred to Dr. Elio Forget for RFA evaluation at L3, L4, and L5.  5) She was given information on radiofrequency ablation.   6) I recommended the Pt consider thyroid and parathyroid workup (if this has not been done) regarding her recent her bone loss in her jaw.   7) Angela Potter has a reminder for a "due or due soon" health maintenance. I have asked that she contact her primary care provider for follow-up on this health maintenance.  8) PMP reviewed.  9) Pt will follow-up in 3 months.      Written by Robinette Haines, [employer], as dictated by Delrae Sawyers, MD.

## 2015-03-17 NOTE — Progress Notes (Signed)
 ASSESSMENT:   1-T2DM Metformin 500mg  bid januvia 100mg  daily 2/'16 tsh, 8/'16 m/c, tsh On ace/plavix/statin HEMOGLOBIN A1C  Date Value Ref Range Status  02/05/2015 7.9* 4.8-5.9 % Final   Above reflects pt ignoring diabetes over the summer/depression.  She appears to be returning to prior better dietary choices -due for repeat [id number] end of nov  2-depression Chronic intermittent challenge for pt.  Strongly recommended that she start zoloft 50mg  Note qt prolongation risk between aricept  and celexa or lexapro  3-HTN Lisinopril  40mg  Aldactone 12.5mg  Hydralazine 100mg  tid Well controlled 8/'16 bmp  4-HPL lipitor 10mg  Lab Results  Component Value Date/Time   CHOLESTEROL 122 07/17/2014 08:40 AM   HDL 49 07/17/2014 08:40 AM   LDL CALCULATION 59 07/17/2014 08:40 AM   TRIGLYCERIDE 72 07/17/2014 08:40 AM   5-vitamin D def Weekly high dose Lab Results  Component Value Date   VITD25OH 34.1 10/20/2014    6-palpitations Never contacted re: holter Last TSH  Lab Results  Component Value Date/Time   TSH 1.05 02/05/2015 09:52 AM      7-H.M. 9/'16 mammogram Advanced directives in emr PLAN:   Discussed evaluation, treatment and usual course. Patient verbalizes understanding and agrees with plan of care. All questions were answered. Side effects of prescribed medicine was discussed. Orders:   Orders Placed This Encounter  Procedures  . IMMUNIZATION ADM, SINGLE OR COMBO VACCINE-FLU [id number]  . FLU 0.5ML IM ([facility] 65+ MEDICARE ONLY) VAC [id number]    Smoking history - Is there current tobacco use, second hand tobacco exposure, or environmental exposure? No   The patient's medicines, past medical, family and social histories (including tobacco usage) were reviewed and updated as appropriate.   SUBJECTIVE:   Chief Complaint  Patient presents with  . DIABETES  . HYPERTENSION  . DEPRESSION    SHE DIDINOT START THE ZOLOFT  . IMM/INJ   69y/o AAMF in f/u re: mood,  diabetes, hypertension Mood is still 'overcast'.  Never started the zoloft due to # of pills already has to take.  Sugars: Mid day 110-120 AM 140-160s Evenings low 100s Feels that is doing better with limiting starches as compared to 8/22 visit.  Focusing on more protein and salad. Denies polyuria/polydipsia No low sugars: diaphoresis, feeling weak or jittery DIABETES Pertinent negatives include no abdominal pain, chest pain, chills, coughing, fever, headaches or nausea.  HYPERTENSION Pertinent negatives include no chest pain, headaches or shortness of breath.  DEPRESSION   IMM/INJ Pertinent negatives include no abdominal pain, chest pain, chills, coughing, fever, headaches or nausea.     Review of Systems  Constitutional: Negative for fever and chills.  Respiratory: Negative for cough and shortness of breath.   Cardiovascular: Negative for chest pain and leg swelling.       Often in the AM and evening experiences 'quick flutters' in chest No associated SOB/dizziness/nausea or chest pain  Gastrointestinal: Negative for heartburn, nausea and abdominal pain.  Genitourinary: Negative for dysuria and frequency.  Neurological: Positive for dizziness. Negative for headaches.     OBJECTIVE   BP 130/78 mmHg  Pulse 74  Temp(Src) 98.2 F (36.8 C) (Oral)  Resp 18  Wt 87.091 kg (192 lb) BMI: 33.47   Physical Exam  Eyes: Conjunctivae are normal.  Cardiovascular: Normal rate, regular rhythm and intact distal pulses.  Exam reveals no gallop.   No murmur heard. No carotid bruit  Pulmonary/Chest: She has no wheezes. She has no rales.  Musculoskeletal: She exhibits no edema.  Neurological: She is alert.  Psychiatric: Affect normal.  Nursing note and vitals reviewed.

## 2015-04-02 ENCOUNTER — Encounter: Admit: 2015-04-02 | Discharge: 2015-04-02 | Payer: MEDICARE | Attending: Anesthesiology | Primary: Internal Medicine

## 2015-04-15 NOTE — Progress Notes (Signed)
 Quick Note:  Left message on cell phone that monitor although demonstrating infrequent early ventricular and atrial beats, her symptoms actually corresponded to normal heart rhythm. Can certainly review again at her upcoming visit later this month ______

## 2015-05-13 ENCOUNTER — Ambulatory Visit: Admit: 2015-05-13 | Discharge: 2015-05-13 | Payer: MEDICARE | Attending: Anesthesiology | Primary: Internal Medicine

## 2015-05-13 ENCOUNTER — Ambulatory Visit: Attending: Anesthesiology | Primary: Internal Medicine

## 2015-05-13 DIAGNOSIS — M47816 Spondylosis without myelopathy or radiculopathy, lumbar region: Secondary | ICD-10-CM

## 2015-05-13 NOTE — Progress Notes (Signed)
Patient is here for procedure consult.

## 2015-05-13 NOTE — Progress Notes (Signed)
[facility]  Interventional Pain Management Consultation History & Physical    PATIENT NAME:  Angela Potter     DATE OF BIRTH:   07-10-1945    DATE OF SERVICE:   05/13/2015      CHIEF COMPLAINT:  Back Pain; Buttocks pain; and Leg Pain      HISTORY OF PRESENT ILLNESS:   Angela Potter presents to the pain clinic today for initial evaluation and to consider interventional pain management options as indicated for the type and location of the pain the patient is presenting with.          Angela BodeBertha L Potter patient presents for initial evaluation and consideration for interventional procedures as indicated.  She is referred to us by Dr. Coralie Carpeneresa Jackson for evaluation and consideration for lumbar radiofrequency neurotomy procedures at bilateral L3-4, L4-5, L5-S1 levels.  At today's evaluation patient complains     Chronic low back pain many years duration.  She states her pain started in the mid 1990s.  She denies any known antecedent trauma injury or motor vehicle accident that started her pain.  Her pain is gotten progressively worse and is now constant.  She currently endorses aching burning and throbbing pain across her low back.  Pain is increased with axial loading maneuvers such as prolonged sitting standing and walking.  Leaning back aggravates her pain.  Leaning forward helps with her pain.  She is tried stretching and exercises that have helped somewhat in the past.  She is tried physical therapy in the past with minimal benefit.  Her current pain regimen offers very little benefit.  She has taken muscle relaxants and nonsteroidals for her pain.  This has not helped her.  By review of available records, patient has had lumbar facet injections      That have helped her in the past however her pain is returned.  I do not believe she is had other interventional pain procedures.     We reviewed the patient's lumbar spine MRI from March 14, 2013.  This  shows lumbar facet hypertrophy, lumbar facet arthropathy at multiple lumbar levels.  She has severe lumbar facet disease with hypertrophy and lumbar facet joint effusions.  This has resulted in moderate central canal stenosis with triangulation of the central canal at L4-5.  Grade 1 anterolisthesis L4 and L5 noted.  Posterior disc bulges L4-5 L5-S1.  Overall impression is severe lumbar facet disease along with lumbar anterolisthesis L4-L5 central canal stenosis also at L4-L5 secondary to severe lumbar facet hypertrophy and lumbar facet disease.      Patient is also known to have previous stroke.  She is maintained on Plavix currently.  Apparently her stroke was 10 years ago.  Dr. [name] MD maintains the patient's Plavix regimen.      We discussed options patient presents with a history of low back pain as her primary complaint.  Patient's low back pain has been present for years, however it is progressively worsening and is now severe.  Her low back pain is increased with axial loading maneuvers as well as with extension of her back.  She denies any radiating leg pain.  She is tried noninterventional measures to help control her low back pain including medications, physical therapy, home exercises and stretching.  These have all proved insufficient controlling her low back pain.  Her lumbar MRI imaging shows evidence for severe lumbar facet disease including lumbar facet hypertrophy, lumbar facet arthropathy, lumbar facet  effusions.  These all indicate including history, physical exam, symptoms as well as image findings strongly pointing to lumbar facet mediated pain secondary to severe lumbar facet disease is a primary etiology for her complaint of low back pain today.  In this regard I am in total agreement with Dr. Coralie Carpen in that I believe the patient will respond well to lumbar radiofrequency neurotomy procedures at bilateral L3-4, L4-5, L5-S1 lumbar levels.  I discussed the  risk and benefits of the procedure with the patient including not only risk and benefits but also indications contraindications and side effects the procedure.  Patient understands and wishes to proceed.  She has no further questions.      Prior to doing these procedures however I will need clearance from Dr. Freeman Caldron in order to stop the patient's Plavix for 7 days prior to neuraxial procedures, and to restart Plavix following day.  I will send this request form today .     MRI: Reviewed lumbar MRI using actual MRI images as well as skeleton model for added emphasis.    PROCEDURES: Discussed lumbar radiofrequency neurotomy procedure    MRI Results (most recent):    Results from Hospital Encounter encounter on 03/14/13   MRI LUMB SPINE WO CONT   Narrative Sagittal and axial multisequence MR images of lumbar spine were obtained.    Mild lumbar scoliosis, left convexity. Grade 1 anterospondylolisthesis of L4 on  L5. No compression fracture or pathologic marrow signal. Conus medullaris ends  at T12 -L1 with normal morphology and signal intensity.    T12-L1: Mild posterior disc bulge with no central stenosis. No foraminal  stenosis.    L1-L2, L2-L3: No disc herniation or central stenosis. Maintained discal height  and signal intensity. No foraminal stenosis.    L3-L4: No disc herniation. Facet and ligamentous hypertrophy present with mild  triangular configuration of the canal. No significant central stenosis. No  foraminal stenosis.    L4-L5: Posterior disc bulge. Mild spondylolisthesis noted. Severe facet disease  with hypertrophy and moderate bilateral facet joint effusion. Ligamentous  thickening also present. Resulting moderate central stenosis with triangular  configuration of the canal. Mild foraminal stenosis with no exiting nerve root  compression.    L5-S1: Mild posterior disc bulge. No significant central stenosis but no  foraminal stenosis. Facets grossly unremarkable.          Impression Impression: Most severe disease is at L4-L5, with spondylolisthesis and facet  disease, central stenosis as described.              PAST MEDICAL HISTORY:   The patient  has a past medical history of Alzheimer's dementia; Arthritis; Back pain; Diabetes (HCC); GERD (gastroesophageal reflux disease); High cholesterol; Hypertension; Lumbar stenosis; and Osteopenia.    PAST SURGICAL HISTORY:   The patient  has a past surgical history that includes abdomen surgery proc unlisted; hernia repair; hysterectomy; other surgical; and colectomy.    CURRENT MEDICATIONS:   The patient has a current medication list which includes the following prescription(s): cyclobenzaprine, metformin, sitagliptin, ergocalciferol, lansoprazole, lisinopril, spironolactone, hydralazine, metoprolol tartrate, atorvastatin, clopidogrel, and donepezil.    ALLERGIES:     Allergies   Allergen Reactions   ??? Zocor [Simvastatin] Unknown (comments)       FAMILY HISTORY:   The patient family history includes Alzheimer in her mother; Cancer in her brother and father; Diabetes in her mother and sister; Elevated Lipids in her mother; Hypertension in her brother, father, mother, and sister; Other in  her father; Stroke in her mother.    SOCIAL HISTORY:   The patient  reports that she quit smoking about 31 years ago. Her smoking use included Cigarettes. She has never used smokeless tobacco. The patient  reports that she does not drink alcohol. She also  reports that she does not use illicit drugs.    REVIEW OF SYSTEMS:   The patient denies fever, chills, weight loss (Constitutional), rash, itching (Skin), tinnitus, congestion (HENT), blurred vision, photophobia (Eyes), palpitations, orthopnea (Cardiovascular), hemoptysis, wheezing (Respiratory), nausea, vomiting, diarrhea (Gastrointestinal), dysuria, hematuria, urgency (Genitourinary), easy bruising, bleeding abnormalities (Hematologic), bowel or bladder  incontinence, loss of consciousness (Neurologic), suicidal or homicidal ideation or hallucinations (Psychiatric).         PHYSICAL EXAM:  VS:   Visit Vitals   ??? BP 134/70 (BP 1 Location: Right arm, BP Patient Position: Sitting)   ??? Pulse (!) 54     General: Well-developed and well-nourished. Body habitus consistent with recorded height and weight and the calculated BMI. Apparent distress due to low back pain.   Head: Normocephalic, atraumatic.  Skin: Inspection of the skin reveals no rashes, lesions or infection.  CV: Regular rate. No murmurs or rubs noted. No peripheral edema noted.  Pulm: Respirations are even and unlabored.  Extr: No clubbing, cyanosis, or edema noted.  Musculoskeletal:  1. Cervical spine ??? Full ROM.  No paraspinous tenderness at any level.  There is no scoliosis, asymmetry, or musculoskeletal defect.  2. Thoracic spine ??? Full ROM.  No paraspinous tenderness at any level.  There is no scoliosis, asymmetry, or musculoskeletal defect.  3. Lumbar spine ???decreased range of motion all axes . Paraspinous tenderness at bilateral lumbar levels . SI joints are nontender bilaterally. There is no scoliosis, asymmetry, or musculoskeletal defect.  4. Right upper extremity ??? Full ROM.  5/5 muscle strength in all muscle groups. No pain or tenderness in shoulder, elbow, wrist, or hand.  5. Left upper extremity ??? Full ROM.  5/5 muscle strength in all muscle groups.  No pain or tenderness in shoulder, elbow, wrist, or hand.  6. Right lower extremity ??? Full ROM.  5/5 muscle strength in all muscle groups. No pain, tenderness, or swelling in the hip, knee, ankle or foot.    7. Left lower extremity ??? Full ROM.  5/5 muscle strength in all muscle groups.  No pain, tenderness, or swelling in the hip, knee, ankle or foot.    Neurological:  1. Mental Status - Alert, awake and oriented. Speech is clear and appropriate.  2. Cranial Nerves - Extraocular muscles intact bilaterally. Cranial nerves  II-XII grossly intact bilaterally.  3. Gait - antalgic   4. Reflexes - 2+ and symmetric throughout.  5. Sensation - Intact to light touch and pin prick.   6. Provocative Tests -  Straight leg raise negative bilaterally.   Psychological:  1. Mood and affect ??? Appropriate.  2. Speech ??? Appropriate.  3. Though content ??? Appropriate.  4. Judgment ??? Appropriate.    ASSESSMENT:      ICD-10-CM ICD-9-CM    1. Spondylosis of lumbar region without myelopathy or radiculopathy M47.816 721.3    2. Facet arthropathy, lumbar (HCC) M12.88 721.3            PLAN:    1.    Diagnoses/Plan: Lumbar spondylosis, lumbar facet arthropathy. We discussed options patient presents with a history of low back pain as her primary complaint.  Patient's low back pain has been present for years,  however it is progressively worsening and is now severe.  Her low back pain is increased with axial loading maneuvers as well as with extension of her back.  She denies any radiating leg pain.  She is tried noninterventional measures to help control her low back pain including medications, physical therapy, home exercises and stretching.  These have all proved insufficient controlling her low back pain.  Her lumbar MRI imaging shows evidence for severe lumbar facet disease including lumbar facet hypertrophy, lumbar facet arthropathy, lumbar facet effusions.  These all indicate including history, physical exam, symptoms as well as image findings strongly pointing to lumbar facet mediated pain secondary to severe lumbar facet disease is a primary etiology for her complaint of low back pain today.  In this regard I am in total agreement with Dr. Coralie Carpen in that I believe the patient will respond well to lumbar radiofrequency neurotomy procedures at bilateral L3-4, L4-5, L5-S1 lumbar levels.  I discussed the risk and benefits of the procedure with the patient including not only risk and benefits but also indications contraindications and side effects  the procedure.  Patient understands and wishes to proceed.  She has no further questions.      Prior to doing these procedures however I will need clearance from Dr. Freeman Caldron in order to stop the patient's Plavix for 7 days prior to neuraxial procedures, and to restart Plavix following day.  I will send this request form today .   2.    I have thoroughly discussed the risks and benefits, side effects and complications, of any and all procedures that were mentioned at today's patient visit. I have used a skeleton model for added emphasis and patient education. I have answered all questions, and I have obtained verbal confirmation for all procedures planned with the patient.   3.    I have reviewed in great detail today the patient's MRI and other imaging studies with the patient. I have explained to the patient their condition using both actual recent and relevant images. I have used a skeleton model for added emphasis as well as patient education.      4.    I have advised patient to have a primary care provider to continue care for health maintenance and general medical conditions and support for referral to specialty care as needed.  5.    I have reviewed with patient the treatment plan, goals of treatment plan, and limitations of treatment plan, to include the potential for side effects from medications and procedures.  If side effects occur, it is the responsibility of the patient to inform the clinic so that a change in the treatment plan can be made in a safe manner. The patient is advised that stopping prescribed medication may cause an increase in symptoms and possible medication withdrawal symptoms. The patient is informed an emergency room evaluation may be necessary if this occurs.      DISPOSITION:   The patient???s condition and plan were discussed at length and all questions were answered.  The patient agrees with the plan.     A total of 40 minutes was spent with the patient of which over half of the time was spent counseling the patient.     Seth Bake, MD 05/15/2015 9:22 AM    Note: Although these clinic notes were documented by the provider at the time of the exam, they have not been proofed and are subject to transcription variance.

## 2015-05-15 DIAGNOSIS — M47816 Spondylosis without myelopathy or radiculopathy, lumbar region: Secondary | ICD-10-CM

## 2015-06-01 ENCOUNTER — Ambulatory Visit: Admit: 2015-06-01 | Payer: MEDICARE | Attending: Family Medicine | Primary: Internal Medicine

## 2015-06-01 ENCOUNTER — Ambulatory Visit: Attending: Family Medicine | Primary: Internal Medicine

## 2015-06-01 DIAGNOSIS — M1288 Other specific arthropathies, not elsewhere classified, other specified site: Secondary | ICD-10-CM

## 2015-06-01 DIAGNOSIS — M47816 Spondylosis without myelopathy or radiculopathy, lumbar region: Secondary | ICD-10-CM

## 2015-06-01 MED ORDER — CYCLOBENZAPRINE 10 MG TAB
10 mg | ORAL_TABLET | ORAL | 2 refills | Status: DC
Start: 2015-06-01 — End: 2015-09-28

## 2015-06-01 NOTE — Progress Notes (Signed)
[facility]  [address]  [place], [place] [id number]  Phone: [phone]  Fax: [phone]          HISTORY OF PRESENT ILLNESS:  Angela AmorBertha L Potter is a 69 y.o. female with history of lumbar pain.  Pt c/o constant lower back pain that intermittently extends to the buttocks. She states that she does not sleep well at night but it is not related to her lower back pain. Pt followed up with Angela Potter for RFA evaluation at L3-L5 but states that she has not yet scheduled the lidocaine trails. She is diabetic and states that overall her blood sugars are well managed by taking Glucophage. Pt continues to take Plavix. She states that she ran out of her Flexeril 10 mg a while ago and never refilled the medication. Pt has been using a topical herbal cream and has experienced relief. Pt at this time desires to continue with current care.    Pain Scale: 4/10    Angela: Angela CaldronJoan Fitzharris, MD       Past Medical History   Diagnosis Date   ??? Alzheimer's dementia    ??? Arthritis    ??? Back pain    ??? Diabetes (HCC)    ??? GERD (gastroesophageal reflux disease)    ??? High cholesterol    ??? Hypertension    ??? Lumbar stenosis    ??? Osteopenia         Social History     Social History   ??? Marital status: MARRIED     Spouse name: N/A   ??? Number of children: N/A   ??? Years of education: N/A     Occupational History   ??? Not on file.     Social History Main Topics   ??? Smoking status: Former Smoker     Types: Cigarettes     Quit date: 05/12/1984   ??? Smokeless tobacco: Never Used   ??? Alcohol use No   ??? Drug use: No   ??? Sexual activity: Not on file     Other Topics Concern   ??? Not on file     Social History Narrative       Current Outpatient Prescriptions   Medication Sig Dispense Refill   ??? cyclobenzaprine (FLEXERIL) 10 mg tablet 1/2 -1 po q pm prn spasm 30 Tab 2   ??? metFORMIN (GLUCOPHAGE) 500 mg tablet Take  by mouth two (2) times daily (with meals).      ??? sitaGLIPtin (JANUVIA) 100 mg tablet Take 100 mg by mouth daily.     ??? ergocalciferol (ERGOCALCIFEROL) 50,000 unit capsule Take 50,000 Units by mouth every seven (7) days.     ??? lansoprazole (PREVACID) 30 mg capsule Take  by mouth Daily (before breakfast).     ??? lisinopril (PRINIVIL, ZESTRIL) 40 mg tablet Take 40 mg by mouth daily.     ??? SPIRONOLACTONE (ALDACTONE PO) Take 25 mg by mouth daily. Takes 1/2 tab daily     ??? hydrALAZINE (APRESOLINE) 100 mg tablet Take 100 mg by mouth three (3) times daily.     ??? metoprolol (LOPRESSOR) 100 mg tablet Take  by mouth two (2) times a day.     ??? atorvastatin (LIPITOR) 10 mg tablet Take 10 mg by mouth daily.     ??? clopidogrel (PLAVIX) 75 mg tablet Take  by mouth daily.     ??? donepezil (ARICEPT) 10 mg tablet Take 10 mg by mouth nightly.  Allergies   Allergen Reactions   ??? Zocor [Simvastatin] Unknown (comments)         REVIEW OF SYSTEMS    Constitutional: Negative for fever, chills, or weight change.   Respiratory: Negative for cough or shortness of breath.     Cardiovascular: Negative for chest pain or palpitations.  Gastrointestinal: Negative for acid reflux, change in bowel habits, or constipation.  Genitourinary: Negative for dysuria and flank pain.   Musculoskeletal: Positive for lumbar pain.  Skin: Negative for rash.   Neurological: Negative for headaches, dizziness, or numbness.  Endo/Heme/Allergies: Negative for increased bruising.   Psychiatric/Behavioral: Negative for difficulty with sleep.      PHYSICAL EXAMINATION  Visit Vitals   ??? BP 119/65   ??? Pulse 64   ??? Temp 98.2 ??F (36.8 ??C) (Oral)   ??? Resp 18   ??? Ht  (1.6 m)   ??? Wt 197 lb 3.2 oz (89.4 kg)   ??? SpO2 98%   ??? BMI 34.93 kg/m2       Constitutional: Awake, alert, and in no acute distress  Neurological: 1+ symmetrical DTRs in the upper extremities. 1+ symmetrical DTRs in the lower extremities. Sensation to light touch is intact. Negative Hoffmann's sign bilaterally.  Skin: warm, dry, and intact.    Musculoskeletal: Tenderness to palpation in the lower lumbar region. Pain with extension and axial loading. No pain with internal or external rotation of her hips. Negative straight leg raise bilaterally.     Hip Flex  Quads Hamstrings Ankle DF EHL Ankle PF   Right +4/5 +4/5 +4/5 +4/5 +4/5 +4/5   Left +4/5 +4/5 +4/5 +4/5 +4/5 +4/5     IMAGING:    Lumbar Spine MRI from 03/14/2013 was personally reviewed with the Pt and demonstrated:    Results from Hospital Encounter encounter on 03/14/13   MRI LUMB SPINE WO CONT   Narrative Sagittal and axial multisequence MR images of lumbar spine were obtained.    Mild lumbar scoliosis, left convexity. Grade 1 anterospondylolisthesis of L4 on  L5. No compression fracture or pathologic marrow signal. Conus medullaris ends  at T12 -L1 with normal morphology and signal intensity.    T12-L1: Mild posterior disc bulge with no central stenosis. No foraminal  stenosis.    L1-L2, L2-L3: No disc herniation or central stenosis. Maintained discal height  and signal intensity. No foraminal stenosis.    L3-L4: No disc herniation. Facet and ligamentous hypertrophy present with mild  triangular configuration of the canal. No significant central stenosis. No  foraminal stenosis.    L4-L5: Posterior disc bulge. Mild spondylolisthesis noted. Severe facet disease  with hypertrophy and moderate bilateral facet joint effusion. Ligamentous  thickening also present. Resulting moderate central stenosis with triangular  configuration of the canal. Mild foraminal stenosis with no exiting nerve root  compression.    L5-S1: Mild posterior disc bulge. No significant central stenosis but no  foraminal stenosis. Facets grossly unremarkable.         Impression Impression: Most severe disease is at L4-L5, with spondylolisthesis and facet  disease, central stenosis as described.          ASSESSMENT   Angela Potter was seen today for back pain.    Diagnoses and all orders for this visit:     Facet arthropathy, lumbar (HCC)  -     cyclobenzaprine (FLEXERIL) 10 mg tablet; 1/2 -1 po q pm prn spasm    Muscle spasm of back  -     cyclobenzaprine (  FLEXERIL) 10 mg tablet; 1/2 -1 po q pm prn spasm    Sleep disorder    Spondylosis of lumbar region without myelopathy or radiculopathy    Spinal stenosis of lumbar region without neurogenic claudication  -     cyclobenzaprine (FLEXERIL) 10 mg tablet; 1/2 -1 po q pm prn spasm         IMPRESSION AND PLAN:  Angela Potter is a 69 y.o. female with history of lumbar pain. Pt c/o constant lower back pain that intermittently extends to the buttocks. She followed up with Dr. Elio Forget for RFA evaluation at L3-L5 but states that she has not yet scheduled the lidocaine trails.       1) Pt was given information on lumbar arthritis exercises.   2) She was given information on radiofrequency ablation.  3) Pt was given information on sleep health.   4) She received a refill of Flexeril 10 mg 1/2- 1 tab QHS prn muscle spasms.   5) Angela Potter has a reminder for a "due or due soon" health maintenance. I have asked that she contact her primary care provider for follow-up on this health maintenance.  6) PMP reviewed.  7) Pt will follow-up in 4 months.      Written by Robinette Haines, [employer], as dictated by Delrae Sawyers, MD.

## 2015-06-01 NOTE — Patient Instructions (Signed)
Low Back Arthritis: Exercises  Your Care Instructions  Here are some examples of typical rehabilitation exercises for your condition. Start each exercise slowly. Ease off the exercise if you start to have pain.  Your doctor or physical therapist will tell you when you can start these exercises and which ones will work best for you.  When you are not being active, find a comfortable position for rest. Some people are comfortable on the floor or a medium-firm bed with a small pillow under their head and another under their knees. Some people prefer to lie on their side with a pillow between their knees. Don't stay in one position for too long.  Take short walks (10 to 20 minutes) every 2 to 3 hours. Avoid slopes, hills, and stairs until you feel better. Walk only distances you can manage without pain, especially leg pain.  How to do the exercises  Pelvic tilt    1. Lie on your back with your knees bent.  2. "Brace" your stomach???tighten your muscles by pulling in and imagining your belly button moving toward your spine.  3. Press your lower back into the floor. You should feel your hips and pelvis rock back.  4. Hold for 6 seconds while breathing smoothly.  5. Relax and allow your pelvis and hips to rock forward.  6. Repeat 8 to 12 times.  Back stretches    1. Get down on your hands and knees on the floor.  2. Relax your head and allow it to droop. Round your back up toward the ceiling until you feel a nice stretch in your upper, middle, and lower back. Hold this stretch for as long as it feels comfortable, or about 15 to 30 seconds.  3. Return to the starting position with a flat back while you are on your hands and knees.  4. Let your back sway by pressing your stomach toward the floor. Lift your buttocks toward the ceiling.  5. Hold this position for 15 to 30 seconds.  6. Repeat 2 to 4 times.  Follow-up care is a key part of your treatment and safety. Be sure to make  and go to all appointments, and call your doctor if you are having problems. It's also a good idea to know your test results and keep a list of the medicines you take.  Where can you learn more?  Go to http://www.healthwise.net/GoodHelpConnections.  Enter T094 in the search box to learn more about "Low Back Arthritis: Exercises."  Current as of: Nov 03, 2014  Content Version: 11.1  ?? 2006-2016 [employer]wise[employer]. Care instructions adapted under license by [employer] (which disclaims liability or warranty for this information). If you have questions about a medical condition or this instruction, always ask your healthcare professional. Healthwise, Incorporated disclaims any warranty or liability for your use of this information.       Learning About Medial Branch Block and Neurotomy  What are medial branch block and neurotomy?    Facet joints connect your vertebrae to each other. Problems in these joints can cause chronic (long-term) pain in the neck or back. They can sometimes affect the shoulders, arms, buttocks, or legs.  Medial branch nerves are the nerves that carry many of the pain messages from your facet joints.  Radiofrequency medial branch neurotomy is a type of medial branch neurotomy that is used to relieve arthritis pain. It uses radio waves to damage nerves in your neck or back so that they can no longer   send pain messages to your brain.  Before your doctor knows if a neurotomy will help you, he or she will do a medial branch block to find out if certain nerves are the ones that are a source of your pain. You will need two separate visits to the outpatient center or hospital to have both procedures.  How is a medial branch block done?  The doctor will use a tiny needle to numb the skin where you will get the block. Then he or she puts the block needle into the numbed area. You may feel some pressure, but you should not feel pain. Using fluoroscopy (live  X-ray) to guide the needle, the doctor injects medicine onto one or more nerves to make them numb.  If you get relief from your pain in the next 4 to 6 hours, it's a sign that those nerves may be contributing to your pain. The relief will last only a short time. You may then have a medial branch neurotomy at a later visit to try to get longer relief.  It takes 20 to 30 minutes to get the block. You can go home after the doctor watches you for about an hour. You will get instructions on how to report how much pain you have when you are at home.  You will need someone to drive you home.  How is medial branch neurotomy done?  The doctor will use a tiny needle to numb the skin where you will get the neurotomy. Then he or she puts the neurotomy needle into the numbed area. You may feel some pressure. Using fluoroscopy (live X-ray) to guide the needle, the doctor sends radio waves through the needle to the nerve for 60 to 90 seconds. The radio waves heat the nerve, which damages it. The doctor may do this several times. And he or she may treat more than one nerve.  It takes 45 to 90 minutes to get a neurotomy, depending on how many nerves are heated. You will probably go home 30 to 60 minutes later.  You will need someone to drive you home.  What can you expect after a neurotomy?  You may feel a little sore or tender at the injection site at first. But after a successful neurotomy, most people have pain relief right away. It often lasts for 9 to 12 months or longer. Sometimes the pain relief is permanent.  If your pain does come back, it may mean that the damaged nerve has healed and can send pain messages again. Or it can mean that a different nerve is causing pain. Your doctor will discuss your options with you.  Follow-up care is a key part of your treatment and safety. Be sure to make and go to all appointments, and call your doctor if you are having  problems. It's also a good idea to know your test results and keep a list of the medicines you take.  Where can you learn more?  Go to StreetWrestling.at.  Enter [phone] in the search box to learn more about "Learning About Medial Branch Block and Neurotomy."  Current as of: August 01, 2014  Content Version: 11.1  ?? 2006-2016 Healthwise, Inc[employer]. Care instructions adapted under license by [employer] (which disclaims liability or warranty for this information). If you have questions about a medical condition or this instruction, always ask your healthcare professional. [place] any warranty or liability for your use of this information.  Learning About Sleeping Well  What does sleeping well mean?  Sleeping well means getting enough sleep. How much sleep is enough varies among people.  The number of hours you sleep is not as important as how you feel when you wake up. If you do not feel refreshed, you probably need more sleep. Another sign of not getting enough sleep is feeling tired during the day.  The average total nightly sleep time is 7?? to 8 hours. Healthy adults may need a little more or a little less than this.  Why is getting enough sleep important?  Getting enough quality sleep is a basic part of good health. When your sleep suffers, your mood and your thoughts can suffer too. You may find yourself feeling more grumpy or stressed. Not getting enough sleep also can lead to serious problems, including injury, accidents, anxiety, and depression.  What might cause poor sleeping?  Many things can cause sleep problems, including:  ?? Stress. Stress can be caused by fear about a single event, such as giving a speech. Or you may have ongoing stress, such as worry about work or school.  ?? Depression, anxiety, and other mental or emotional conditions.  ?? Changes in your sleep habits or surroundings. This includes changes that  happen where you sleep, such as noise, light, or sleeping in a different bed. It also includes changes in your sleep pattern, such as having jet lag or working a late shift.  ?? Health problems, such as pain, breathing problems, and restless legs syndrome.  ?? Lack of regular exercise.  How can you help yourself?  Here are some tips that may help you sleep more soundly and wake up feeling more refreshed.  Your sleeping area  ?? Use your bedroom only for sleeping and sex. A bit of light reading may help you fall asleep. But if it doesn't, do your reading elsewhere in the house. Don't watch TV in bed.  ?? Be sure your bed is big enough to stretch out comfortably, especially if you have a sleep partner.  ?? Keep your bedroom quiet, dark, and cool. Use curtains, blinds, or a sleep mask to block out light. To block out noise, use earplugs, soothing music, or a "[place] noise" machine.  Your evening and bedtime routine  ?? Create a relaxing bedtime routine. You might want to take a warm shower or bath, listen to soothing music, or drink a cup of noncaffeinated tea.  ?? Go to bed at the same time every night. And get up at the same time every morning, even if you feel tired.  What to avoid  ?? Limit caffeine (coffee, tea, caffeinated sodas) during the day, and don't have any for at least 4 to 6 hours before bedtime.  ?? Don't drink alcohol before bedtime. Alcohol can cause you to wake up more often during the night.  ?? Don't smoke or use tobacco, especially in the evening. Nicotine can keep you awake.  ?? Don't take naps during the day, especially close to bedtime.  ?? Don't lie in bed awake for too long. If you can't fall asleep, or if you wake up in the middle of the night and can't get back to sleep within 15 minutes or so, get out of bed and go to another room until you feel sleepy.  ?? Don't take medicine right before bed that may keep you awake or make you feel hyper or energized. Your doctor can tell you if your medicine may do  this and if you can take it earlier in the day.  If you can't sleep  ?? Imagine yourself in a peaceful, pleasant scene. Focus on the details and feelings of being in a place that is relaxing.  ?? Get up and do a quiet or boring activity until you feel sleepy.  ?? Don't drink any liquids after 6 p.m. if you wake up often because you have to go to the bathroom.  Where can you learn more?  Go to [employer]Stats.ca.  Enter [phone] in the search box to learn more about "Learning About Sleeping Well."  Current as of: January 06, 2015  Content Version: 11.1  ?? 2006-2016 Healthwise, [employer]. Care instructions adapted under license by [employer] (which disclaims liability or warranty for this information). If you have questions about a medical condition or this instruction, always ask your healthcare professional. Healthwise, Incorporated disclaims any warranty or liability for your use of this information.

## 2015-06-25 NOTE — Progress Notes (Signed)
 ASSESSMENT:   1-T2DM Metformin 500mg  bid januvia 100mg  daily HEMOGLOBIN A1C  Date Value Ref Range Status  04/28/2015 6.5*, m/c 4.8-5.9 % Final  8/'16 tsh, 12/'16 eye, 9/'16 foot On ace/plavix/statin  -2 mo f/u, hgba1c prior  2-mild dementia Last seen by neurology 02/2013 Neuropsychology testing 08/2014 showed some deterioration as compared to 2014 Continues on aricept  10mg  plavix 75mg  daily -recommend re eval at f/u visit by neurology  3-HTN Lisinopril  40mg  Aldactone 12.5mg  Hydralazine 100mg  tid Borderline acceptable control 11/'16 bmp, m/c Vitals 10/30/2014 02/02/2015 03/17/2015 06/25/2015  BP 142/78 136/78 130/78 140/76   4-vitamin D def Weekly high dose Lab Results  Component Value Date   VITD25OH 31.8* 04/28/2015    5-HPL lipitor 10mg  Lab Results  Component Value Date/Time   CHOLESTEROL 122 07/17/2014 08:40 AM   HDL 49 07/17/2014 08:40 AM   LDL CALCULATION 59 07/17/2014 08:40 AM   TRIGLYCERIDE 72 07/17/2014 08:40 AM   6-H.M. 9/'16 mammogram  7-constipation Last TSH  Lab Results  Component Value Date/Time   TSH 1.05 02/05/2015 09:52 AM      Recommended miralax daily prn in lieu of ex lax PLAN:   Discussed evaluation, treatment and usual course. Patient verbalizes understanding and agrees with plan of care. All questions were answered. Medication Orders this Encounter  Medications  . clopidogrel (PLAVIX) 75 mg PO TABS    Sig: TAKE 1 TABLET BY MOUTH ONCE A DAY.    Dispense:  90 Tab    Refill:  1    CYCLE FILL MEDICATION. Authorization is required for next refill.  . donepezil  (ARICEPT ) 10 mg PO TABS    Sig: Take 1 Tab by Mouth Every Night at Bedtime.    Dispense:  90 Tab    Refill:  0  . metoprolol  (LOPRESSOR ) 100 mg PO TABS    Sig: Take 1 Tab by Mouth Twice Daily.    Dispense:  180 Tab    Refill:  1    CYCLE FILL MEDICATION. Authorization is required for next refill.  . sitaGLIPtin (JANUVIA) 100 mg PO TABS    Sig: TAKE 1 TABLET BY MOUTH ONCE A  DAY.    Dispense:  90 Tab    Refill:  1    CYCLE FILL MEDICATION. Authorization is required for next refill.  . metFORMIN (GLUCOPHAGE) 500 mg PO TABS    Sig: Take 1 Tab by Mouth Twice Daily.    Dispense:  180 Tab    Refill:  1    CYCLE FILL MEDICATION. Authorization is required for next refill.  [name] atorvastatin (LIPITOR) 10 mg PO TABS    Sig: Take 1 Tab by Mouth Once a Day. TAKE 1 TABLET BY MOUTH EVERY NIGHT AT BEDTIME.    Dispense:  90 Tab    Refill:  3    No refills available  . lisinopril  (PRINIVIL ) 40 mg PO TABS    Sig: Take 1 Tab by Mouth Once a Day.    Dispense:  90 Tab    Refill:  1  . ergocalciferol (VITAMIN D2) 50,000 unit PO CAPS    Sig: TAKE 1 CAPSULE BY MOUTH EVERY 7 DAYS.    Dispense:  12 Cap    Refill:  0    CYCLE FILL MEDICATION. Authorization is required for next refill.  . hydrALAzine (APRESOLINE) 100 mg PO TABS    Sig: TAKE 1 TABLET BY MOUTH 3 TIMES DAILY.    Dispense:  270 Tab    Refill:  1  CYCLE FILL MEDICATION. Authorization is required for next refill.   Side effects of prescribed medicine was discussed. Orders:   Orders Placed This Encounter  Procedures  . HGB A1C WITH EST AVG GLUCOSE  . AST  . ALT  . LIPID COMPLETE PANEL  . CBC    Smoking status/exposure - Is there current tobacco use, second hand tobacco exposure, or environmental exposure? No   The patient's medicines, past medical, family and social histories (including tobacco usage) were reviewed and updated as appropriate.   SUBJECTIVE:   Chief Complaint  Patient presents with  . DIABETES  . HYPERTENSION   69y/o [facility] in f/u re: hypertension, diabetes Dr Leonce, spine center, follows her re: 'arthritis'.  Has stopped going to the [facility] but wishes to resume as 'was helping'.  Sugars 130s 'when I'm really good'.  Can be 140-150s though today 175 Denies diaphoresis/feeling weak No polyuria/polydipsia  Home sbps 140s 'or lower', dbp 'sometimes 47' Denies headache, chest pain,  palpitations  DIABETES Pertinent negatives include no abdominal pain, chest pain, chills, coughing, fever, headaches or nausea.  HYPERTENSION Pertinent negatives include no chest pain, headaches or shortness of breath.     Review of Systems  Constitutional: Negative for fever and chills.  Respiratory: Negative for cough and shortness of breath.   Cardiovascular: Negative for chest pain and leg swelling.       Rare 'heart skips, quick beat'   Gastrointestinal: Negative for heartburn, nausea and abdominal pain.       BMs every 2 days-this is her baseline, occasionally takes ex lax  Genitourinary: Negative for dysuria.  Neurological: Negative for dizziness and headaches.  Psychiatric/Behavioral:       Mood is 'good with occasional down, mostly even' Has the zoloft bottle and just looks at it- 'in case I need to take'     OBJECTIVE   BP 140/76 mmHg  Pulse 63  Temp(Src) 98.2 F (36.8 C) (Oral)  Resp 16  Ht 5' 3.5 (1.613 m)  Wt 89.086 kg (196 lb 6.4 oz)  BMI 34.24 kg/m2 BMI: 34.24   Physical Exam  Constitutional: No distress.  Eyes: Conjunctivae are normal.  Neck: No thyromegaly present.  Cardiovascular: Normal rate, regular rhythm and intact distal pulses.  Exam reveals no gallop.   No murmur heard. Pulmonary/Chest: She has no wheezes. She has no rales.  Musculoskeletal: She exhibits no edema.  Neurological: She is alert.  Skin: She is not diaphoretic.  Psychiatric: Affect normal.  Nursing note and vitals reviewed.

## 2015-09-03 NOTE — Progress Notes (Signed)
 Medicare Wellness Exam Theresa Mendez, Theresa Mendez, medical record number [id number], DOB:22-Aug-1945 presents today for her wellness visit.  Type of Medicare exam completed: Annual Wellness Visit - Subsequent. There is not a caregiver present.  Vital signs: BP 136/78 mmHg  Pulse 74  Temp(Src) 97.9 F (36.6 C) (Oral)  Resp 18  Ht 5' 3 (1.6 m)  Wt 90.447 kg (199 lb 6.4 oz)  BMI 35.33 kg/m2  Patient Active Problem List  Diagnosis  . Breast Calcification Seen on Mammogram-right 6//'10-[facility]  . 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 DJD (degenerative joint disease), cervical-c spine CT 11/'10  . LVH (left ventricular hypertrophy)-mild concentric 1/'11 echo  . Osteopenia-1/'11 DEXA spine=-1.2, hips intact  . Vitamin D deficiency  . 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 S/P colonoscopy with polypectomy-10/05/2011 [place]  . Dementia  . Anxiety and depression  . Hyperlipidemia associated with type 2 diabetes mellitus (HCC)  . Hypertension associated with diabetes (HCC)  . Type 2 diabetes mellitus without complication, without long-term current use of insulin  [facility])   Past Medical History  Diagnosis Date  . Osteopenia   . Hypercholesteremia   . Hypertension   . Diabetes mellitus (HCC)   . Back pain   . Knee pain   . Colon polyp   . Hemorrhoid   . Esophageal reflux   . Piercing   . Unspecified cerebral artery occlusion with cerebral infarction [facility])    Immunization History  Administered Date(s) Administered  . Afluria (Medicare) 03/01/2012, 04/24/2014, 03/17/2015  . Fluvirin (Medicare Only) 04/18/2011  . INFLU VAC 0.5/0.25ml im inj 03/23/2009, 03/21/2013  . PNEUMO 0.15mL(PNEUMOVAX 23)VAC Inj 04/18/2011  . Prevnar (Pneum 13) Imm 01/23/2014  . TDAP 2.5-8-5 Lf-mcg-lf/o.5 Ml(BOOSTRIX) Vac 01/26/2009  . VARICELLA-ZOSTER(ZOSTAVAX->33yrs)VAC INJ 06/23/2008   Medications:  has a current medication list which includes the following prescription(s):  clopidogrel, donepezil , metoprolol , sitagliptin, metformin, atorvastatin, lisinopril , ergocalciferol, hydralazine, spironolactone, insulin  lispro, blood sugar diagnostic, lansoprazole, insulin  pen needles (disposable), TYPE-IN MEDICATION, blood-glucose meter, and cyclobenzaprine. Family History: family history includes Alcohol abuse in her brother, father, maternal grandfather, and paternal grandfather; Asthma in her mother; Cancer in her maternal aunt; Cancer, Prostate in her brother and father; Diabetes in her maternal aunt, maternal uncle, paternal aunt, paternal uncle, and sister; Heart Disease in her paternal grandmother; High Cholesterol in her brother, mother, and sister; Hypertension in her brother, father, mother, and sister; Stroke in her mother. Social History:  reports that she has quit smoking. She has never used smokeless tobacco. She reports that she does not drink alcohol or use illicit drugs., Counseling given: Yes   Concerns of excessive alcohol use?No Concerns of drug use: No  The patient states they exercise >/= to 2.5 hours per week:No, counseled on light to moderate aerobic exercise 2.5 hours per week.  Do you have a caregiver or someone designated to help you at home?none  Do you need assistance with the phone, transportation,shopping,meal preparation,housework, taking/obtaining medications, managing your finances or other activities of daily living? No If you use a caregiver are they meeting these needs?  NA  Incontinence Screening:  Have you had problems with bladder control? yes  Depression Screening: 1. Over the past two weeks have you felt down, depressed or hopeless? yes 2. Over the past two weeks have you experienced little interest or pleasure in doing things?No  PHQ9 Score:   Do you feel your mental health has SIGNIFICANTLY declined over the past 2 years?No  Cognitive Screening Perform the following  tests: Three word recall (1 point for each word correct) Clock  Draw test (1 point if all numbers are correct and 1 point for hand positions correct) Mini Cognitive score 1/ 5 (normal is 4 or 5)  Function Screening: Do you feel your physical health has SIGNIFICANTLY declined over the past 2 years? no  Have you lost 10lbs in weight over the last 6 months without trying to do so? no  Pain Screening: Is pain currently interfering with your daily activities:no  Safety/Fall Risk Screening: (test: Arise from chair, walk 10 feet, turn and return to chair, turn and sit. 3 trials allowed, may be averaged) 1. Was the patient's timed Get Up and Go Test unsteady or longer than 20 seconds? No 2. Have you had any falls within the last year?no 3. Have you had a fracture in the last year? no 4. Does your home lack handrails, have poor lighting or throw rugs in the hallways? No  Hearing Loss Screening Do you have difficulty with your hearing? No  Vision Examination (required by IPPE only) No exam data present  Glaucoma Screening Have you had an eye exam with an Ophthalmologist in the last year? yes  Advance Care Planning Explanation of the purpose of Advance Care Planning has been discussed with Patient. The following materials were given: Patient declined/already has Information or has an Advance Care Plan on file. Documentation in EMR:   Advance Care Plan: Requested patient to bring Advance Care Plan in at next office visit. Reviewed current ACP: Not applicable Does this individual have the ability to prepare an Advanced Care Plan? yes  Preventative Care Review: The patient's preventative care was reviewed? No Health Maintenance  Topic Date Due  . ANNUAL MEDICARE WELLNESS VISIT  Sep 18, 1945  . HEP-C SCREENING  1946-05-12  . OBESITY SCREENING  02/09/1948  . ADVANCE DIRECTIVE COUNSELING  02/09/1996  . PHYSICAL/EXAM  08/04/2010  . PAP/ CERVICAL CANCER SCREENING  02/09/2011  . OSTEOPOROSIS SCREENING  06/24/2011  . BLOOD PRESSURE  07/16/2014  . BREAST  EXAM  01/10/2015  . LIPID SCREENING  07/18/2015  . A1C  10/26/2015  . FLU VACCINE  01/12/2016  . MAMMOGRAM/BREAST CANCER SCREENING  02/12/2016  . DIABETIC FOOT EXAM  03/09/2016  . CREATININE (KIDNEY FUNCTION TEST)  04/27/2016  . URINE MICROALBUMIN  04/27/2016  . DIABETIC EYE EXAM  05/26/2016  . COLONOSCOPY/COLON CANCER SCREENING  10/04/2016  . 25 HYDROXY VITAMIN D TEST  04/27/2017  . TD VACCINE  01/27/2019  . TDAP VACCINE  Completed  . ALCOHOL SCREENING  Completed  . TOBACCO SCREENING  Completed  . PNEUMO VACCINE 65+ LOW RISK  Completed  . SHINGLES VACCINE  Completed    Current list of Health Care Providers: Patient Care Team: Fitzharris, Candis MATSU, MD as PCP - General Tessa Stagger, MD (Surgery) Reynaldo Garnette HERO, OD as Consulting Physician (Ophthalmology) Jock Donnice BROCKS, DPM as Consulting Physician (Podiatry) Alaine Oneil NOVAK, MD as Consulting Physician (Orthopedic Surgery)  PPPS/Care Plan was reviewed and a copy given to the patient.

## 2015-09-03 NOTE — Progress Notes (Signed)
 ASSESSMENT:   1-T2DM Metformin 500mg  bid januvia 100mg  daily 9/'16 foot 8/'16 tsh, 11/'16 m/c 12/'16 eye On ace/plavix/statin Pt would be interested in dietary reinforcement -nutrition referral -hgba1c -3 mo f/u  2-dementia/depression Last seen by neurology 02/2014 Neuropsychology testing 08/2014 showed some detrioration as compared to 2014 continues on aricept  10mg  Pt had requested off of zoloft in the fall as felt didn't need it, however in the interim has had social isolation, less motivation regarding her health.  Strongly recommended resumption of zoloft 50mg  -schedule f/u w/neurology, Dr Gust  3-HTN Lisinopril  40mg  Aldactone 12.5mg  Hydralazine 100mg  tid Vitals 06/25/2015 09/03/2015  BP 140/76 136/78  11/'16 bmp, m/c  4-vitamin D def Weekly high dose Lab Results  Component Value Date   VITD25OH 31.8* 04/28/2015    5-HPL lipitor 10mg  Lab Results  Component Value Date/Time   CHOLESTEROL 157 09/03/2015 10:10 AM   HDL 50 09/03/2015 10:10 AM   LDL CALCULATION 91 09/03/2015 10:10 AM   TRIGLYCERIDE 81 09/03/2015 10:10 AM   6-H.M. 9/'16 mammogram PLAN:   Discussed evaluation, treatment and usual course. Patient verbalizes understanding and agrees with plan of care. All questions were answered. Medication Orders this Encounter  Medications  . spironolactone (ALDACTONE) 25 mg PO TABS    Sig: TAKE 1/2 TABLET BY MOUTH ONCE A DAY.    Dispense:  45 Tab    Refill:  1    CYCLE FILL MEDICATION. Authorization is required for next refill.   Side effects of prescribed medicine was discussed. Orders:   Orders Placed This Encounter  Procedures  . VENIPUNCTURE  . SPECIMEN HANDLING FEE  . Referral To Nutrition  . BASIC METABOLIC PANEL  . TSH  . HGB A1C WITH EST AVG GLUCOSE    Smoking status/exposure - Is there current tobacco use, second hand tobacco exposure, or environmental exposure? No   The patient's medicines, past medical, family and social histories  (including tobacco usage) were reviewed and updated as appropriate.   SUBJECTIVE:   Chief Complaint  Patient presents with  . MEDICARE ADVANTAGE ANNUAL VISIT  . LOW BACK PAIN    DISCUSS WATER THERAPY   . DIABETES    PT IS FASTING   70y/o AAMF in f/u re: diabetes, hypertension Sugars 150-200-admits not adhering to diet(too expensive and doesn't cook) and let her [employer] membership lapse so had stopped doing water aerobics. Denies polyuria/polydipsia  No home bp checks Denies headache/chest pain/palpitations/SOB  Has not been going to church, has withdrawn socially. LOW BACK PAIN Pertinent negatives include no abdominal pain, chest pain, chills, coughing, fever, headaches or nausea.  DIABETES Pertinent negatives include no abdominal pain, chest pain, chills, coughing, fever, headaches or nausea.     Review of Systems  Constitutional: Negative for fever and chills.  Respiratory: Negative for cough and shortness of breath.   Cardiovascular: Negative for chest pain and leg swelling.       Flutters 'every now and then' lasting seconds  Gastrointestinal: Negative for heartburn, nausea and abdominal pain.  Genitourinary: Negative for dysuria and frequency.  Neurological: Negative for dizziness and headaches.  Endo/Heme/Allergies: Negative for polydipsia.     OBJECTIVE   BP 136/78 mmHg  Pulse 74  Temp(Src) 97.9 F (36.6 C) (Oral)  Resp 18  Ht 5' 3 (1.6 m)  Wt 90.447 kg (199 lb 6.4 oz)  BMI 35.33 kg/m2 BMI: 35.33   Physical Exam  Constitutional: No distress.  Neck: No thyromegaly present.  Cardiovascular: Normal rate, regular rhythm and intact distal pulses.  Exam reveals no gallop.   No murmur heard. No carotid bruit  Pulmonary/Chest: She has no wheezes. She has no rales.  Musculoskeletal: She exhibits no edema.  Neurological: She is alert.  Skin: She is not diaphoretic.  Psychiatric: Affect normal.  Nursing note and vitals reviewed.

## 2015-09-04 NOTE — Progress Notes (Signed)
 Quick Note:  Reviewed elevation with pt who does not wish to change regime but rather is committed to doing better with diet/exercise ______

## 2015-09-28 ENCOUNTER — Ambulatory Visit: Admit: 2015-09-28 | Discharge: 2015-09-28 | Payer: MEDICARE | Attending: Family Medicine | Primary: Internal Medicine

## 2015-09-28 ENCOUNTER — Ambulatory Visit: Attending: Family Medicine | Primary: Internal Medicine

## 2015-09-28 DIAGNOSIS — M47816 Spondylosis without myelopathy or radiculopathy, lumbar region: Secondary | ICD-10-CM

## 2015-09-28 MED ORDER — CYCLOBENZAPRINE 10 MG TAB
10 mg | ORAL_TABLET | ORAL | 3 refills | Status: DC
Start: 2015-09-28 — End: 2015-12-29

## 2015-09-28 NOTE — Patient Instructions (Addendum)
MyChart Activation    Thank you for requesting access to MyChart. Please follow the instructions below to securely access and download your online medical record. MyChart allows you to send messages to your doctor, view your test results, renew your prescriptions, schedule appointments, and more.    How Do I Sign Up?    1. In your internet browser, go to [email or web address]  2. Click on the First Time User? Click Here link in the Sign In box. You will be redirect to the New Member Sign Up page.  3. Enter your MyChart Access Code exactly as it appears below. You will not need to use this code after you???ve completed the sign-up process. If you do not sign up before the expiration date, you must request a new code.    MyChart Access Code: [email or web address]  Expires: 12/27/2015  8:25 AM (This is the date your MyChart access code will expire)    4. Enter the last four digits of your Social Security Number (xxxx) and Date of Birth (mm/dd/yyyy) as indicated and click Submit. You will be taken to the next sign-up page.  5. Create a MyChart ID. This will be your MyChart login ID and cannot be changed, so think of one that is secure and easy to remember.  6. Create a MyChart password. You can change your password at any time.  7. Enter your Password Reset Question and Answer. This can be used at a later time if you forget your password.   8. Enter your e-mail address. You will receive e-mail notification when new information is available in MyChart.  9. Click Sign Up. You can now view and download portions of your medical record.  10. Click the Download Summary menu link to download a portable copy of your medical information.    Additional Information    If you have questions, please visit the Frequently Asked Questions section of the MyChart website at [email or web address] Remember, MyChart is NOT to be used for urgent needs. For medical emergencies, dial 911.         Low Back Arthritis: Exercises   Your Care Instructions  Here are some examples of typical rehabilitation exercises for your condition. Start each exercise slowly. Ease off the exercise if you start to have pain.  Your doctor or physical therapist will tell you when you can start these exercises and which ones will work best for you.  When you are not being active, find a comfortable position for rest. Some people are comfortable on the floor or a medium-firm bed with a small pillow under their head and another under their knees. Some people prefer to lie on their side with a pillow between their knees. Don't stay in one position for too long.  Take short walks (10 to 20 minutes) every 2 to 3 hours. Avoid slopes, hills, and stairs until you feel better. Walk only distances you can manage without pain, especially leg pain.  How to do the exercises  Pelvic tilt    1. Lie on your back with your knees bent.  2. "Brace" your stomach???tighten your muscles by pulling in and imagining your belly button moving toward your spine.  3. Press your lower back into the floor. You should feel your hips and pelvis rock back.  4. Hold for 6 seconds while breathing smoothly.  5. Relax and allow your pelvis and hips to rock forward.  6. Repeat 8 to 12 times.  Back stretches    1. Get down on  your hands and knees on the floor.  2. Relax your head and allow it to droop. Round your back up toward the ceiling until you feel a nice stretch in your upper, middle, and lower back. Hold this stretch for as long as it feels comfortable, or about 15 to 30 seconds.  3. Return to the starting position with a flat back while you are on your hands and knees.  4. Let your back sway by pressing your stomach toward the floor. Lift your buttocks toward the ceiling.  5. Hold this position for 15 to 30 seconds.  6. Repeat 2 to 4 times.  Follow-up care is a key part of your treatment and safety. Be sure to make and go to all appointments, and call your doctor if you are having  problems. It's also a good idea to know your test results and keep a list of the medicines you take.  Where can you learn more?  Go to [email or web address].  Enter [phone] in the search box to learn more about "Low Back Arthritis: Exercises."  Current as of: Nov 03, 2014  Content Version: 11.2  ?? 2006-2017 [employer]. Care instructions adapted under license by [employer] (which disclaims liability or warranty for this information). If you have questions about a medical condition or this instruction, always ask your healthcare professional. [place] any warranty or liability for your use of this information.

## 2015-09-28 NOTE — Progress Notes (Signed)
[facility]  [address]  [place], [place] [address]  Phone: [phone]  Fax: [phone]      ASSESSMENT   Angela Potter was seen today for back pain.    Diagnoses and all orders for this visit:    Spondylosis of lumbar region without myelopathy or radiculopathy  -     SCHEDULE SURGERY    Muscle spasm of back  -     SCHEDULE SURGERY  -     cyclobenzaprine (FLEXERIL) 10 mg tablet; 1/2 -1 po q pm prn spasm  Indications: MUSCLE SPASM    Spinal stenosis of lumbar region without neurogenic claudication    Facet arthropathy, lumbar (HCC)         IMPRESSION AND PLAN:  Angela Potter is a 70 y.o. female with history of lumbar pain. She had to cancel shopping with her granddaughter 1 week ago due to increased lower back pain. In generally her pain is intermittent and Pt c/o occasional coccyx pain. She admits to relief when taking Flexeril 10 mg intermittently but reports that she is currently out of the medication.    1) Pt was given information on lumbar arthritis exercises.   2) I recommended the Pt try water exercise.   3) She was scheduled for a bilateral L4-5 facet injection.   4) Pt received a refill of Flexeril 10 mg 1/2-1 tab daily prn muscle spasm.  5) Ms. Ferrer has a reminder for a "due or due soon" health maintenance. I have asked that she contact her primary care provider, Freeman Caldron, MD, for follow-up on this health maintenance.  6) PMP reviewed.  7) Pt will follow-up in 3 months.  8) RFA as also discussed and she will consider this if her pain worsens or continues to affect daily activities      HISTORY OF PRESENT ILLNESS:  Angela Potter is a 70 y.o. female with history of lumbar pain. She states that she was supposed to take her granddaughter shopping 1 week ago but was unable to go due to increased pain. Pt reports that her lower back pain is intermittent and she occasionally experiences coccyx pain. She  admits to relief when taking Flexeril 10 mg intermittently and reports that she is currently out of the medication. Pt also reports sedation when taking the Flexeril but has never tried taking 1/2 tab Flexeril 10 mg. She reports that she experienced pain relief for 2-3 weeks after trying steroid injections but her pain as gradually returned. Pt reports that she used to be a member at [employer] and has tried water aerobics classes in the past with benefit. She states that her [employer] membership has since expired but she plans to rejoin. Pt had an evaluation with Dr. Elio Forget on 05/13/2015 but never scheduled the lidocaine trials. Of note, Pt continues to take Plavix. Pt at this time desires to proceed with steroid injections.    Of note, Pt admits that she has noticed her teeth are falling out but she has not had a bone density study within the last 2 years. She has a history of osteopenia.     Pain Scale: 2/10    PCP: Freeman Caldron, MD       Past Medical History:   Diagnosis Date   ??? Alzheimer's dementia    ??? Arthritis    ??? Back pain    ??? Diabetes (HCC)    ??? GERD (gastroesophageal reflux disease)    ???  High cholesterol    ??? Hypertension    ??? Lumbar stenosis    ??? Osteopenia         Social History     Social History   ??? Marital status: MARRIED     Spouse name: N/A   ??? Number of children: N/A   ??? Years of education: N/A     Occupational History   ??? Not on file.     Social History Main Topics   ??? Smoking status: Former Smoker     Types: Cigarettes     Quit date: 05/12/1984   ??? Smokeless tobacco: Never Used   ??? Alcohol use No   ??? Drug use: No   ??? Sexual activity: Not on file     Other Topics Concern   ??? Not on file     Social History Narrative       Current Outpatient Prescriptions   Medication Sig Dispense Refill   ??? cyclobenzaprine (FLEXERIL) 10 mg tablet 1/2 -1 po q pm prn spasm  Indications: MUSCLE SPASM 30 Tab 3   ??? metFORMIN (GLUCOPHAGE) 500 mg tablet Take  by mouth two (2) times daily (with meals).      ??? sitaGLIPtin (JANUVIA) 100 mg tablet Take 100 mg by mouth daily.     ??? ergocalciferol (ERGOCALCIFEROL) 50,000 unit capsule Take 50,000 Units by mouth every seven (7) days.     ??? lansoprazole (PREVACID) 30 mg capsule Take  by mouth Daily (before breakfast).     ??? lisinopril (PRINIVIL, ZESTRIL) 40 mg tablet Take 40 mg by mouth daily.     ??? SPIRONOLACTONE (ALDACTONE PO) Take 25 mg by mouth daily. Takes 1/2 tab daily     ??? hydrALAZINE (APRESOLINE) 100 mg tablet Take 100 mg by mouth three (3) times daily.     ??? metoprolol (LOPRESSOR) 100 mg tablet Take  by mouth two (2) times a day.     ??? atorvastatin (LIPITOR) 10 mg tablet Take 10 mg by mouth daily.     ??? clopidogrel (PLAVIX) 75 mg tablet Take  by mouth daily.     ??? donepezil (ARICEPT) 10 mg tablet Take 10 mg by mouth nightly.         Allergies   Allergen Reactions   ??? Zocor [Simvastatin] Unknown (comments)         REVIEW OF SYSTEMS    Constitutional: Negative for fever, chills, or weight change.   Respiratory: Negative for cough or shortness of breath.     Cardiovascular: Negative for chest pain or palpitations.  Gastrointestinal: Negative for acid reflux, change in bowel habits, or constipation.  Genitourinary: Negative for dysuria and flank pain.   Musculoskeletal: Positive for lumbar pain.  Skin: Negative for rash.   Neurological: Negative for headaches, dizziness, or numbness.  Endo/Heme/Allergies: Negative for increased bruising.   Psychiatric/Behavioral: Negative for difficulty with sleep.      PHYSICAL EXAMINATION  Visit Vitals   ??? BP 114/56   ??? Pulse 72   ??? Temp 98.3 ??F (36.8 ??C) (Oral)   ??? Resp 18   ??? Ht 5\' 3"  (1.6 m)   ??? Wt 193 lb 9.6 oz (87.8 kg)   ??? SpO2 96%   ??? BMI 34.29 kg/m2       Constitutional: Awake, alert, and in no acute distress  Neurological: 1+ symmetrical DTRs in the lower extremities. Sensation to light touch is intact.   Skin: warm, dry, and intact.   Musculoskeletal: Tenderness to palpation in  the lower lumbar region. Pain  with extension, side to side flexion, and axial loading. No pain with internal or external rotation of her hips. Negative straight leg raise bilaterally.     Hip Flex  Quads Hamstrings Ankle DF EHL Ankle PF   Right +4/5 +4/5 +4/5 +4/5 +4/5 +4/5   Left +4/5 +4/5 +4/5 +4/5 +4/5 +4/5     IMAGING:    Lumbar spine MRI from 03/14/2013 was personally reviewed with the Pt and demonstrated:    Results from Hospital Encounter encounter on 03/14/13   MRI LUMB SPINE WO CONT   Narrative Sagittal and axial multisequence MR images of lumbar spine were obtained.    Mild lumbar scoliosis, left convexity. Grade 1 anterospondylolisthesis of L4 on  L5. No compression fracture or pathologic marrow signal. Conus medullaris ends  at T12 -L1 with normal morphology and signal intensity.    T12-L1: Mild posterior disc bulge with no central stenosis. No foraminal  stenosis.    L1-L2, L2-L3: No disc herniation or central stenosis. Maintained discal height  and signal intensity. No foraminal stenosis.    L3-L4: No disc herniation. Facet and ligamentous hypertrophy present with mild  triangular configuration of the canal. No significant central stenosis. No  foraminal stenosis.    L4-L5: Posterior disc bulge. Mild spondylolisthesis noted. Severe facet disease  with hypertrophy and moderate bilateral facet joint effusion. Ligamentous  thickening also present. Resulting moderate central stenosis with triangular  configuration of the canal. Mild foraminal stenosis with no exiting nerve root  compression.    L5-S1: Mild posterior disc bulge. No significant central stenosis but no  foraminal stenosis. Facets grossly unremarkable.         Impression Impression: Most severe disease is at L4-L5, with spondylolisthesis and facet  disease, central stenosis as described.          Written by Robinette Haines, [employer], as dictated by Delrae Sawyers, MD.  I, Dr. Delrae Sawyers confirm that all documentation is accurate.

## 2015-09-29 NOTE — Telephone Encounter (Signed)
 Left message with Nat that indeed plavix may be stopped prior to anticipated block

## 2015-10-07 ENCOUNTER — Inpatient Hospital Stay: Payer: MEDICARE

## 2015-10-07 ENCOUNTER — Ambulatory Visit: Admit: 2015-10-07 | Payer: MEDICARE | Primary: Internal Medicine

## 2015-10-07 LAB — GLUCOSE, POC: Glucose (POC): 104 mg/dL (ref 70–110)

## 2015-10-07 MED ORDER — SODIUM CHLORIDE 0.9 % IJ SYRG
INTRAMUSCULAR | Status: DC | PRN
Start: 2015-10-07 — End: 2015-10-07

## 2015-10-07 MED ORDER — IOPAMIDOL 41 % INTRATHECAL
200 mg iodine /mL (41 %) | INTRATHECAL | Status: DC | PRN
Start: 2015-10-07 — End: 2015-10-07
  Administered 2015-10-07: 19:00:00

## 2015-10-07 MED ORDER — LIDOCAINE (PF) 10 MG/ML (1 %) IJ SOLN
10 mg/mL (1 %) | INTRAMUSCULAR | Status: DC | PRN
Start: 2015-10-07 — End: 2015-10-07
  Administered 2015-10-07: 19:00:00

## 2015-10-07 MED ORDER — LIDOCAINE (PF) 10 MG/ML (1 %) IJ SOLN
10 mg/mL (1 %) | INTRAMUSCULAR | Status: AC
Start: 2015-10-07 — End: ?

## 2015-10-07 MED ORDER — DEXAMETHASONE SODIUM PHOSPHATE (PF) 10 MG/ML INJECTION
10 mg/mL | INTRAMUSCULAR | Status: AC
Start: 2015-10-07 — End: ?

## 2015-10-07 MED ORDER — DIAZEPAM 5 MG TAB
5 mg | Freq: Once | ORAL | Status: AC
Start: 2015-10-07 — End: 2015-10-07
  Administered 2015-10-07: 18:00:00 via ORAL

## 2015-10-07 MED ORDER — DEXAMETHASONE SODIUM PHOSPHATE 10 MG/ML IJ SOLN
10 mg/mL | INTRAMUSCULAR | Status: DC | PRN
Start: 2015-10-07 — End: 2015-10-07
  Administered 2015-10-07: 19:00:00

## 2015-10-07 MED FILL — BD POSIFLUSH NORMAL SALINE 0.9 % INJECTION SYRINGE: INTRAMUSCULAR | Qty: 10

## 2015-10-07 MED FILL — LIDOCAINE (PF) 10 MG/ML (1 %) IJ SOLN: 10 mg/mL (1 %) | INTRAMUSCULAR | Qty: 30

## 2015-10-07 MED FILL — DEXAMETHASONE SODIUM PHOSPHATE (PF) 10 MG/ML INJECTION: 10 mg/mL | INTRAMUSCULAR | Qty: 3

## 2015-10-07 MED FILL — DIAZEPAM 5 MG TAB: 5 mg | ORAL | Qty: 1

## 2015-10-07 NOTE — Procedures (Signed)
Facet Joint Block Procedure Note    Patient Name: Angela Potter    Date of Procedure: October 07, 2015    Preoperative Diagnosis: Spondylosis of lumbar region without myelopathy or radiculopathy [M47.816]  Back muscle spasm [M62.830]    Post Operative Diagnosis:Spondylosis of lumbar region without myelopathy or radiculopathy [M47.816]  Back muscle spasm [M62.830]     Procedure:  bilateral  L4-L5 Facet Joint Block      Consent: Informed consent was obtained prior to the procedure. The patient was given the opportunity to ask questions regarding the procedure and its associated risks. In addition to the potential risks associated with the procedure itself, the patient was informed both verbally and in writing of potential side effects of the use of glucocorticoids. The patient appeared to comprehend the informed consent and desired to have the procedure perfored.    Procedure: The patient was placed in the prone position on the flouroscopy table and the back was prepped and draped in the usual sterile manner. At each blocked level, the exact location of the facet joint was identified with flouroscopy, and after local Lidocaine 1% injection, a #22 gauge spinal needle was then advanced toward the joint. A total of 30 mg of preservative free Dexamethasone and 5 cc of Lidocaine was injected around and equally divided among all of the sites.     The patient tolerated the procedure well. The injection area was cleaned and bandaids applied. No excessive bleeding was noted. Patient dressed and was discharged to home with instructions.       Tobie Lordsheresa G [place] Skalicky, MD  October 07, 2015

## 2015-10-07 NOTE — H&P (Signed)
Date of Surgery Update:  Burgess AmorBertha L Potter was seen and examined.  History and physical has been reviewed. The patient has been examined. There have been no significant clinical changes since the completion of the last office visit.    Signed By: Tobie Lordsheresa G Maleia Weems, MD     October 07, 2015 1:49 PM

## 2015-10-07 NOTE — Procedures (Signed)
Facet Joint Block Procedure Note    Patient Name: Angela Potter    Date of Procedure: October 07, 2015    Preoperative Diagnosis: Spondylosis of lumbar region without myelopathy or radiculopathy [M47.816]  Back muscle spasm [M62.830]    Post Operative Diagnosis:Spondylosis of lumbar region without myelopathy or radiculopathy [M47.816]  Back muscle spasm [M62.830]     Procedure:  bilateral  L4-L5 Facet Joint Block      Consent: Informed consent was obtained prior to the procedure. The patient was given the opportunity to ask questions regarding the procedure and its associated risks. In addition to the potential risks associated with the procedure itself, the patient was informed both verbally and in writing of potential side effects of the use of glucocorticoids. The patient appeared to comprehend the informed consent and desired to have the procedure perfored.    Procedure: The patient was placed in the prone position on the flouroscopy table and the back was prepped and draped in the usual sterile manner. At each blocked level, the exact location of the facet joint was identified with flouroscopy, and after local Lidocaine 1% injection, a #22 gauge spinal needle was then advanced toward the joint. A total of 30 mg of preservative free Dexamethasone and 5 cc of Lidocaine was injected around and equally divided among all of the sites.     The patient tolerated the procedure well. The injection area was cleaned and bandaids applied. No excessive bleeding was noted. Patient dressed and was discharged to home with instructions.       Shirley Decamp G Kennard Fildes, MD  October 07, 2015

## 2015-10-09 NOTE — Telephone Encounter (Signed)
Called patient, verified DOB, informed of message below, informed patient to increase her fluid and caffeine intake.    Patient verbalized agreement/understanding.      Patient will return call to office on Monday if she feels she may need to be seen sooner.    No further action required at this time.

## 2015-10-09 NOTE — Telephone Encounter (Signed)
Patient had facect block bil l 4-5 on 10/07/15. She started having severe shooting pain yesterday and today more severe shooting pain at the injection site. Is this normal? Please call her today at [phone]

## 2015-10-09 NOTE — Telephone Encounter (Signed)
Pt may be having a steroid flare and it should calm down over the next week to 10 days

## 2015-10-09 NOTE — Telephone Encounter (Signed)
Please review message below and advise.

## 2015-11-20 NOTE — ED Provider Notes (Signed)
 [facility]ER[facility]Y DEPARTMENT  Time of Arrival:  11/06/2015  8:31 PM    (S93.402A) Sprain of left ankle, unspecified ligament, initial encounter  Assessment/Differential Diagnosis:  fx , sprain  ED Course/Medical Decision Making:   Neg xr  aircast  Follow up  .   Theresa Mendez  Pre-Hospital/Procedures/Consults:    Theresa Mendez  Disposition:  Home   Discharge Medication List as of 11/06/2015 10:19 PM     Chief Complaint  Patient presents with  . ANKLE INJURY    HPI Comments: 70 yo F with ankle pain  States she was moving wheelchair when it fell against her ankle  Unsure if she rolled her ankle as well  Now with lat ankle pain  Some pain with walking     ANKLE INJURY Pertinent negatives include no abdominal pain, chest pain, chills, coughing, fever, headaches, nausea, neck pain, sore throat or vomiting.    Review of Systems: Constitutional: Negative for fever and chills.  HENT: Negative for sore throat and neck pain.   Eyes: Negative for redness.  Respiratory: Negative for cough and shortness of breath.   Cardiovascular: Negative for chest pain.  Gastrointestinal: Negative for nausea, vomiting, abdominal pain and diarrhea.  Genitourinary: Negative for dysuria.  Musculoskeletal: Negative for back pain.       Ankle pain    Skin: Negative for wound.  Neurological: Negative for headaches.  Hematological: negative.  Negative for adenopathy.  Psychiatric/Behavioral: Negative for agitation.    Physical Exam  Constitutional: She is oriented to person, place, and time and well-developed, well-nourished, and in no distress. No distress.  HENT:  Head: Normocephalic and atraumatic.  Eyes: Conjunctivae and EOM are normal.  Neck: Normal range of motion.  Cardiovascular: Normal rate.   Pulmonary/Chest: Effort normal and breath sounds normal.  Abdominal: She exhibits no distension.  Musculoskeletal: Normal range of motion. She exhibits tenderness. She exhibits no edema.  Lat  aspect of L ankle with mild swelling and ttp    Neurological: She is alert and oriented to person, place, and time. No cranial nerve deficit. She exhibits normal muscle tone. GCS score is 15.  Skin: Skin is warm and dry. No rash noted. She is not diaphoretic. No erythema.  Psychiatric: Mood and affect normal.  Nursing note and vitals reviewed.   Past Medical History  Diagnosis Date  . Osteopenia   . Hypercholesteremia   . Hypertension   . Diabetes mellitus (HCC)   . Back pain   . Knee pain   . Colon polyp   . Hemorrhoid   . Esophageal reflux   . Piercing   . Unspecified cerebral artery occlusion with cerebral infarction    Past Surgical History  Procedure Laterality Date  . Colon resection  '00  . Hernia repair  2002  . Lysis of adhesions  '01  . Partial hysterectomy  1989    for uterine fibroids  . Other  2010    Bone removal in right wirst   Family History  Problem Relation Age of Onset  . Alcohol abuse Father   . Cancer, Prostate Father   . Hypertension Father   . Asthma Mother   . Hypertension Mother   . High Cholesterol Mother   . Stroke Mother   . Diabetes Sister   . Hypertension Sister   . High Cholesterol Sister   . Alcohol abuse Brother   . Cancer, Prostate Brother   . Hypertension Brother   . High Cholesterol Brother   . Diabetes  Maternal Aunt   . Cancer Maternal Aunt     Lung Cancer- Mom's half sister Share Father  . Diabetes Paternal Aunt   . Diabetes Maternal Uncle   . Diabetes Paternal Uncle   . Heart Disease Paternal Grandmother   . Alcohol abuse Maternal Grandfather   . Alcohol abuse Paternal Grandfather    Social History   Social History  . Marital Status: Married    Spouse Name: N/A  . Number of Children: N/A  . Years of Education: N/A   Occupational History  . Not on file.   Social History Main Topics  . Smoking status: Former Games developer  . Smokeless tobacco: Never Used     Comment: quit 1990's  . Alcohol Use: No  . Drug Use: No   . Sexual Activity:    Partners: Male   Other Topics Concern  . Caffeine Concern No  . Exercise No  . Seat Belt Yes  . Self-Exams No   Social History Narrative   Outpatient Prescriptions Marked as Taking for the 11/06/15 encounter [facility] Encounter) with Janet Fairy RAMAN, MD  Medication Sig Dispense Refill  . spironolactone (ALDACTONE) 25 mg PO TABS TAKE 1/2 TABLET BY MOUTH ONCE A DAY. 45 Tab 1  . clopidogrel (PLAVIX) 75 mg PO TABS TAKE 1 TABLET BY MOUTH ONCE A DAY. 90 Tab 1  . donepezil  (ARICEPT ) 10 mg PO TABS Take 1 Tab by Mouth Every Night at Bedtime. 90 Tab 0  . metoprolol  (LOPRESSOR ) 100 mg PO TABS Take 1 Tab by Mouth Twice Daily. 180 Tab 1  . sitaGLIPtin (JANUVIA) 100 mg PO TABS TAKE 1 TABLET BY MOUTH ONCE A DAY. 90 Tab 1  . metFORMIN (GLUCOPHAGE) 500 mg PO TABS Take 1 Tab by Mouth Twice Daily. 180 Tab 1  . atorvastatin (LIPITOR) 10 mg PO TABS Take 1 Tab by Mouth Once a Day. TAKE 1 TABLET BY MOUTH EVERY NIGHT AT BEDTIME. 90 Tab 3  . lisinopril  (PRINIVIL ) 40 mg PO TABS Take 1 Tab by Mouth Once a Day. 90 Tab 1  . ergocalciferol (VITAMIN D2) 50,000 unit PO CAPS TAKE 1 CAPSULE BY MOUTH EVERY 7 DAYS. 12 Cap 0  . hydrALAzine (APRESOLINE) 100 mg PO TABS TAKE 1 TABLET BY MOUTH 3 TIMES DAILY. 270 Tab 1  . lansoprazole (PREVACID) 30 mg PO CPDR Take 1 Cap by Mouth Once a Day. 90 Cap 3  . cyclobenzaprine (FLEXERIL) 10 mg PO TABS Take 5 mg by Mouth Every 8 Hours As Needed.     Allergies  Allergen Reactions  . Zocor [Simvastatin] muscle/joint pain    Vital Signs: No data found.    Documentation Review:  Initial ED Provider Note , Old medical records, Nursing notes  Diagnostics: Labs:  No results found for this visit on 11/06/15. ECG: No results found for this visit on 11/06/15.  Rhythm interpretation from monitor: N/A  ANKLE COMPLETE LT  Final Result  Impression:  --------------    No significant abnormalities.

## 2015-11-26 DIAGNOSIS — IMO0002 Reserved for concepts with insufficient information to code with codable children: Secondary | ICD-10-CM | POA: Insufficient documentation

## 2015-12-26 ENCOUNTER — Emergency Department: Payer: MEDICARE | Primary: Internal Medicine

## 2015-12-26 ENCOUNTER — Emergency Department: Admit: 2015-12-26 | Payer: MEDICARE | Primary: Internal Medicine

## 2015-12-26 ENCOUNTER — Inpatient Hospital Stay
Admit: 2015-12-26 | Discharge: 2015-12-26 | Disposition: A | Discharge status: Home / Self Care | Payer: MEDICARE | Attending: Emergency Medicine

## 2015-12-26 DIAGNOSIS — S9032XA Contusion of left foot, initial encounter: Secondary | ICD-10-CM

## 2015-12-26 MED ORDER — ACETAMINOPHEN 325 MG TABLET
325 mg | ORAL_TABLET | ORAL | 0 refills | Status: DC | PRN
Start: 2015-12-26 — End: 2017-02-13

## 2015-12-26 MED ORDER — ACETAMINOPHEN 500 MG TAB
500 mg | ORAL | Status: AC
Start: 2015-12-26 — End: 2015-12-26
  Administered 2015-12-26: 17:00:00 via ORAL

## 2015-12-26 MED FILL — TYLENOL EXTRA STRENGTH 500 MG TABLET: 500 mg | ORAL | Qty: 2

## 2015-12-26 NOTE — ED Triage Notes (Signed)
Patient states bumping foot into something while ambulating in dark.  C/o pain to left foot primarily 5th toe.

## 2015-12-26 NOTE — ED Provider Notes (Signed)
HPI Comments: Angela Potter is a 70 y.o. female with a pertinent history of DM, OA, GERD, HTN, HLD, osteopenia, Alzheimer's dementia who presents to the emergency department for evaluation of left 5th toe pain after accidentally bumping it into something when she got out of bed last night.  She states she is walking on it gingerly.  No treatments pta.  Pt denies any fevers or chills, headache, dizziness or light headedness, ENT issues, CP or discomfort, SOB, cough, n/v/d/c, abd pain, back pain, diaphoresis, melena/hematochezia, dysuria, hematuria, frequency, focal weakness/numbness/tingling, or rash.  Patient has no other complaints at this time.    PCP:  [name], MD        Patient is a 70 y.o. female presenting with foot pain.   Foot Pain    Pertinent negatives include no back pain.        Past Medical History:   Diagnosis Date   ??? Alzheimer's dementia    ??? Arthritis    ??? Back pain    ??? Diabetes (HCC)    ??? GERD (gastroesophageal reflux disease)    ??? High cholesterol    ??? Hypertension    ??? Lumbar stenosis    ??? Osteopenia        Past Surgical History:   Procedure Laterality Date   ??? ABDOMEN SURGERY PROC UNLISTED      Scar tissue removed from abdomen   ??? HX COLECTOMY     ??? HX HERNIA REPAIR     ??? HX HYSTERECTOMY     ??? HX OTHER SURGICAL      Colon polyp removal         Family History:   Problem Relation Age of Onset   ??? Alzheimer Mother    ??? Stroke Mother    ??? Hypertension Mother    ??? Diabetes Mother    ??? Elevated Lipids Mother    ??? Hypertension Father    ??? Cancer Father      Prostate   ??? Other Father      Back problems   ??? Diabetes Sister    ??? Hypertension Sister    ??? Hypertension Brother    ??? Cancer Brother      Prostate x 4 brothers       Social History     Social History   ??? Marital status: MARRIED     Spouse name: N/A   ??? Number of children: N/A   ??? Years of education: N/A     Occupational History   ??? Not on file.     Social History Main Topics   ??? Smoking status: Former Smoker     Types: Cigarettes      Quit date: 05/12/1984   ??? Smokeless tobacco: Never Used   ??? Alcohol use No   ??? Drug use: No   ??? Sexual activity: Not on file     Other Topics Concern   ??? Not on file     Social History Narrative         ALLERGIES: Zocor [simvastatin]    Review of Systems   Constitutional: Negative for chills and fever.   HENT: Negative for congestion, rhinorrhea and sore throat.    Respiratory: Negative for cough and shortness of breath.    Cardiovascular: Negative for chest pain.   Gastrointestinal: Negative for abdominal pain, blood in stool, constipation, diarrhea, nausea and vomiting.   Genitourinary: Negative for dysuria, frequency and hematuria.   Musculoskeletal: Positive for arthralgias. Negative  for back pain, gait problem and myalgias.   Skin: Negative for rash and wound.   Neurological: Negative for dizziness and headaches.       Vitals:    12/26/15 1134   BP: 143/75   Pulse: 60   Resp: 16   Temp: 98.4 ??F (36.9 ??C)   SpO2: 98%   Weight: 88.5 kg (195 lb)   Height: 5' 3.5" (1.613 m)            Physical Exam   Constitutional: She is oriented to person, place, and time. She appears well-developed and well-nourished. No distress.   HENT:   Head: Normocephalic and atraumatic.   Eyes: Conjunctivae are normal.   Neck: Normal range of motion. Neck supple.   Cardiovascular: Normal rate, regular rhythm and normal heart sounds.    Pulmonary/Chest: Effort normal and breath sounds normal. No respiratory distress. She exhibits no tenderness.   Musculoskeletal: Normal range of motion. She exhibits tenderness. She exhibits no edema or deformity.   TTP noted to left 5th toe.  No obvious deformity, edema, ecchymosis, erythema, or overlying skin changes noted on exam.  Pt is ambulatory with even, steady gait, moving BLE with 5/5 strength and FROM against resistance in flexion and extension.  Pt is neurovascularly intact distally with cap refill < 3 seconds and intact sensation.        Neurological: She is alert and oriented to person, place, and time. She has normal reflexes.   Skin: Skin is warm and dry. She is not diaphoretic.   Psychiatric: She has a normal mood and affect.   Nursing note and vitals reviewed.       MDM  Number of Diagnoses or Management Options  Contusion of left foot, initial encounter: new and requires workup  Diagnosis management comments: Differential Diagnosis: fracture, dislocation, tendinous/ligamentous tear/strain, contusion, hematoma, OA, RA, gout    Plan:  Pt presents ambulatory in NAD, well-hydrated, non-toxic in appearance, with normal vitals.  Exam reveals TTP left 5th toe without any objective findings of trauma.  XR does not appear to demonstrate any acute bony abnormality, pending radiology read.  Will DC home with cast shoe, tylenol, and ortho follow-up as needed.        At this time, patient is stable and appropriate for discharge home.  Patient demonstrates understanding of current diagnoses and is in agreement with the treatment plan.  They are advised that while the likelihood of serious underlying condition is low at this point given the evaluation performed today, we cannot fully rule it out.  They are advised to immediately return with any new symptoms or worsening of current condition.  All questions have been answered.  Patient is given educational material regarding their diagnoses, including danger symptoms and when to return to the ED.  Pt strongly urged to follow-up with Ortho.           Amount and/or Complexity of Data Reviewed  Tests in the radiology section of CPT??: ordered and reviewed  Review and summarize past medical records: yes  Independent visualization of images, tracings, or specimens: yes (No acute bony abnormality noted by me)    Risk of Complications, Morbidity, and/or Mortality  Presenting problems: moderate  Diagnostic procedures: moderate  Management options: moderate    Patient Progress  Patient progress: improved    ED Course        Procedures    -------------------------------------------------------------------------------------------------------------------  Orders:  Orders Placed This Encounter   ??? XR FOOT LT MIN 3  V     Standing Status:   Standing     Number of Occurrences:   1     Order Specific Question:   Transport     Answer:   Ambulatory [1]     Order Specific Question:   Reason for Exam     Answer:   foot injury.   ??? APPLY SHOE POST-OP; LEFT     Standing Status:   Standing     Number of Occurrences:   1     Order Specific Question:   Type of Shoe:     Answer:   POST-OP     Order Specific Question:   Laterality:     Answer:   LEFT   ??? acetaminophen (TYLENOL) tablet 1,000 mg   ??? acetaminophen (TYLENOL) 325 mg tablet     Sig: Take 2 Tabs by mouth every four (4) hours as needed for Pain.     Dispense:  20 Tab     Refill:  0      Progress Notes:  12:27 PM:  Michiel Sites, PA-C was at the pt's bedside, assessed the pt and answered the pt's questions regarding treatment.    -------------------------------------------------------------------------------------------------------------------    Disposition:  Diagnosis:   1. Contusion of left foot, initial encounter        Disposition: DC Home    Follow-up Information     Follow up With Details Comments Contact Info    Freeman Caldron, MD Call in 2 days As needed for follow-up [address]  [address]  [place] [address]  [phone]      [facility] Call in 2 days As needed for further evaluation [address] [place], [address]  [place] [address]  [phone]    [facility] Go to As needed, If symptoms worsen [address] [place] [address]  [phone]          Patient's Medications   Start Taking    ACETAMINOPHEN (TYLENOL) 325 MG TABLET    Take 2 Tabs by mouth every four (4) hours as needed for Pain.   Continue Taking    ATORVASTATIN (LIPITOR) 10 MG TABLET    Take 10 mg by mouth daily.     CLOPIDOGREL (PLAVIX) 75 MG TABLET    Take  by mouth daily.    CYCLOBENZAPRINE (FLEXERIL) 10 MG TABLET    1/2 -1 po q pm prn spasm  Indications: MUSCLE SPASM    DONEPEZIL (ARICEPT) 10 MG TABLET    Take 10 mg by mouth nightly.    ERGOCALCIFEROL (ERGOCALCIFEROL) 50,000 UNIT CAPSULE    Take 50,000 Units by mouth every seven (7) days.    HYDRALAZINE (APRESOLINE) 100 MG TABLET    Take 100 mg by mouth three (3) times daily.    LANSOPRAZOLE (PREVACID) 30 MG CAPSULE    Take  by mouth Daily (before breakfast).    LISINOPRIL (PRINIVIL, ZESTRIL) 40 MG TABLET    Take 40 mg by mouth daily.    METFORMIN (GLUCOPHAGE) 500 MG TABLET    Take  by mouth two (2) times daily (with meals).    METOPROLOL (LOPRESSOR) 100 MG TABLET    Take  by mouth two (2) times a day.    SITAGLIPTIN (JANUVIA) 100 MG TABLET    Take 100 mg by mouth daily.    SPIRONOLACTONE (ALDACTONE PO)    Take 25 mg by mouth daily. Takes 1/2 tab daily  These Medications have changed    No medications on file   Stop Taking    No medications on file

## 2015-12-26 NOTE — ED Notes (Signed)
Pt states ready for discharge. Pt states she will follow up with PCP as instructed by provider. Pt appears in NOAD.    I have reviewed discharge instructions with the patient. Prescriptions were reviewed with patient instructed not to drink alcohol, drive a car, or operate heavy machinery while taking this medicine. The patient verbalized understanding. Patient seen leaving ED ambulatory without difficulty or need for assistance, with S/O in no sign of distress. Patient armband removed and shredded    Current Discharge Medication List      START taking these medications    Details   acetaminophen (TYLENOL) 325 mg tablet Take 2 Tabs by mouth every four (4) hours as needed for Pain.  Qty: 20 Tab, Refills: 0         CONTINUE these medications which have NOT CHANGED    Details   cyclobenzaprine (FLEXERIL) 10 mg tablet 1/2 -1 po q pm prn spasm  Indications: MUSCLE SPASM  Qty: 30 Tab, Refills: 3    Associated Diagnoses: Muscle spasm of back      metFORMIN (GLUCOPHAGE) 500 mg tablet Take  by mouth two (2) times daily (with meals).    Associated Diagnoses: Facet arthropathy, lumbar (HCC); Spinal stenosis of lumbar region without neurogenic claudication      sitaGLIPtin (JANUVIA) 100 mg tablet Take 100 mg by mouth daily.    Associated Diagnoses: Facet arthropathy, lumbar (HCC); Spinal stenosis of lumbar region without neurogenic claudication      ergocalciferol (ERGOCALCIFEROL) 50,000 unit capsule Take 50,000 Units by mouth every seven (7) days.    Associated Diagnoses: Facet arthropathy, lumbar (HCC); Spinal stenosis of lumbar region without neurogenic claudication      lansoprazole (PREVACID) 30 mg capsule Take  by mouth Daily (before breakfast).      lisinopril (PRINIVIL, ZESTRIL) 40 mg tablet Take 40 mg by mouth daily.      SPIRONOLACTONE (ALDACTONE PO) Take 25 mg by mouth daily. Takes 1/2 tab daily      hydrALAZINE (APRESOLINE) 100 mg tablet Take 100 mg by mouth three (3) times daily.       metoprolol (LOPRESSOR) 100 mg tablet Take  by mouth two (2) times a day.      atorvastatin (LIPITOR) 10 mg tablet Take 10 mg by mouth daily.      clopidogrel (PLAVIX) 75 mg tablet Take  by mouth daily.      donepezil (ARICEPT) 10 mg tablet Take 10 mg by mouth nightly.

## 2015-12-29 ENCOUNTER — Ambulatory Visit: Admit: 2015-12-29 | Discharge: 2015-12-29 | Payer: MEDICARE | Attending: Family Medicine | Primary: Internal Medicine

## 2015-12-29 ENCOUNTER — Ambulatory Visit: Attending: Family Medicine | Primary: Internal Medicine

## 2015-12-29 DIAGNOSIS — M1288 Other specific arthropathies, not elsewhere classified, other specified site: Secondary | ICD-10-CM

## 2015-12-29 DIAGNOSIS — M47816 Spondylosis without myelopathy or radiculopathy, lumbar region: Secondary | ICD-10-CM

## 2015-12-29 MED ORDER — CELECOXIB 200 MG CAP
200 mg | ORAL_CAPSULE | ORAL | 3 refills | Status: AC
Start: 2015-12-29 — End: ?

## 2015-12-29 MED ORDER — CYCLOBENZAPRINE 10 MG TAB
10 mg | ORAL_TABLET | ORAL | 3 refills | Status: DC
Start: 2015-12-29 — End: 2017-02-13

## 2015-12-29 NOTE — Progress Notes (Signed)
[facility]  [address] [place] [address]  Phone: [phone]  Fax: [phone]      ASSESSMENT   [name] was seen today for back pain.    Diagnoses and all orders for this visit:    Facet arthropathy, lumbar ([place])  -     celecoxib (CELEBREX) 200 mg capsule; 1 po daily as needed; take with food    Muscle spasm of back  -     cyclobenzaprine (FLEXERIL) 10 mg tablet; 1/2 -1 po q pm prn spasm  Indications: MUSCLE SPASM    Spinal stenosis of lumbar region without neurogenic claudication    Chronic anticoagulation         IMPRESSION AND PLAN:  Angela Potter is a 70 y.o. female with history of lumbar pain. Pt reports that she is doing well at this time and experience "little discomfort" in the lower back. She tried a bilateral L4-L5 facet injection on 10/07/2015 with relief.       1) Pt was given information on lumbar arthritis exercises.   2) I recommended the Pt try water exercise, gentle yoga, and tai chi.  3) I encouraged the Pt to exercise 30 minutes a day, 5 days a week.  4) She received a refill of Flexeril 10 mg 1/2-1 tab QHS prn.   5) Pt was prescribed Celebrex 200 mg 1 tab daily prn with food. She will discuss starting Celebrex with Dr. Garrel Ridgel prior to taking the medication.  6) Angela Potter has a reminder for a "due or due soon" health maintenance. I have asked that she contact her primary care provider, Garrel Ridgel, MD, for follow-up on this health maintenance.  7) PMP demonstrated consistency with prescribing.   8) Pt will follow-up in 3 months.      HISTORY OF PRESENT ILLNESS:  Angela Potter is a 70 y.o. female with history of lumbar pain. Pt reports that she is doing well at this time and experience "little discomfort" in the lower back. She tried a bilateral L4-L5 facet injection on 10/07/2015 and experienced relief. Pt stubbed her 5th toe 1 week ago and presents to  the office wearing a walking shoe. She admits that she never wears shoes in the house but per Pt, her last foot x-ray did not demonstrate fracture. Pt denies any worsening lower back pain since sh stubbed her toe. She admits to an inconsistent HEP and has considered joining the [facility]. Pt has been prescribed Flexeril 10 mg and rarely uses the medication. She admits to experiencing some sedation when taking Flexeril in the past. Pt is diabetic and reports that her last blood sugar level was 176 mg/dL. She is currently on Plavix. Pt reports that she has taken Celebrex in the past and will follow up with her PCP, Dr. Garrel Ridgel, within the next month. Pt at this time desires to proceed with medication evaluation.     Pain Scale: 5/10    PCP: Garrel Ridgel, MD       Past Medical History:   Diagnosis Date   ??? Alzheimer's dementia    ??? Arthritis    ??? Back pain    ??? Diabetes ([place])    ??? GERD (gastroesophageal reflux disease)    ??? High cholesterol    ??? Hypertension    ??? Lumbar stenosis    ??? Osteopenia         Social History     Social History   ???  Marital status: MARRIED     Spouse name: N/A   ??? Number of children: N/A   ??? Years of education: N/A     Occupational History   ??? Not on file.     Social History Main Topics   ??? Smoking status: Former Smoker     Types: Cigarettes     Quit date: 05/12/1984   ??? Smokeless tobacco: Never Used   ??? Alcohol use No   ??? Drug use: No   ??? Sexual activity: Not on file     Other Topics Concern   ??? Not on file     Social History Narrative       Current Outpatient Prescriptions   Medication Sig Dispense Refill   ??? celecoxib (CELEBREX) 200 mg capsule 1 po daily as needed; take with food 30 Cap 3   ??? cyclobenzaprine (FLEXERIL) 10 mg tablet 1/2 -1 po q pm prn spasm  Indications: MUSCLE SPASM 30 Tab 3   ??? acetaminophen (TYLENOL) 325 mg tablet Take 2 Tabs by mouth every four (4) hours as needed for Pain. 20 Tab 0   ??? metFORMIN (GLUCOPHAGE) 500 mg tablet Take  by mouth two (2) times daily  (with meals).     ??? sitaGLIPtin (JANUVIA) 100 mg tablet Take 100 mg by mouth daily.     ??? ergocalciferol (ERGOCALCIFEROL) 50,000 unit capsule Take 50,000 Units by mouth every seven (7) days.     ??? lansoprazole (PREVACID) 30 mg capsule Take  by mouth Daily (before breakfast).     ??? lisinopril (PRINIVIL, ZESTRIL) 40 mg tablet Take 40 mg by mouth daily.     ??? SPIRONOLACTONE (ALDACTONE PO) Take 25 mg by mouth daily. Takes 1/2 tab daily     ??? hydrALAZINE (APRESOLINE) 100 mg tablet Take 100 mg by mouth three (3) times daily.     ??? metoprolol (LOPRESSOR) 100 mg tablet Take  by mouth two (2) times a day.     ??? atorvastatin (LIPITOR) 10 mg tablet Take 10 mg by mouth daily.     ??? clopidogrel (PLAVIX) 75 mg tablet Take  by mouth daily.     ??? donepezil (ARICEPT) 10 mg tablet Take 10 mg by mouth nightly.         Allergies   Allergen Reactions   ??? Zocor [Simvastatin] Unknown (comments)         REVIEW OF SYSTEMS    Constitutional: Negative for fever, chills, or weight change.   Respiratory: Negative for cough or shortness of breath.     Cardiovascular: Negative for chest pain or palpitations.  Gastrointestinal: Negative for acid reflux, change in bowel habits, or constipation.  Genitourinary: Negative for dysuria and flank pain.   Musculoskeletal: Positive for lumbar pain; left foot pain  Skin: Negative for rash.   Neurological: Negative for headaches, dizziness, or numbness.  Endo/Heme/Allergies: Negative for increased bruising.   Psychiatric/Behavioral: Negative for difficulty with sleep.      PHYSICAL EXAMINATION  Visit Vitals   ??? BP 126/64   ??? Pulse (!) 56   ??? Temp 98 ??F (36.7 ??C)   ??? Resp 18   ??? Wt 190 lb (86.2 kg)   ??? SpO2 96%   ??? BMI 33.13 kg/m2       Constitutional: Awake, alert, and in no acute distress  Neurological: 1+ symmetrical DTRs in the lower extremities. Sensation to light touch is intact.  Skin: warm, dry, and intact.   Musculoskeletal: Tight across the upper trapezius. Tenderness  to palpation  in the lower lumbar region. Moderate pain with extension axial loading. No pain with internal or external rotation of her hips. Negative straight leg raise bilaterally.  Stiff bottomed shoe on the left     Hip Flex  Quads Hamstrings Ankle DF EHL Ankle PF   Right +4/5 +4/5 +4/5 +4/5 +4/5 +4/5   Left +4/5 +4/5 +4/5  4/5 Not tested  4/5     IMAGING:    Lumbar spine MRI from 03/14/2013 was personally reviewed with the Pt and demonstrated:    Results from [place] encounter on 03/14/13   MRI LUMB SPINE WO CONT   Narrative Sagittal and axial multisequence MR images of lumbar spine were obtained.    Mild lumbar scoliosis, left convexity. Grade 1 anterospondylolisthesis of L4 on  L5. No compression fracture or pathologic marrow signal. Conus medullaris ends  at T12 -L1 with normal morphology and signal intensity.    T12-L1: Mild posterior disc bulge with no central stenosis. No foraminal  stenosis.    L1-L2, L2-L3: No disc herniation or central stenosis. Maintained discal height  and signal intensity. No foraminal stenosis.    L3-L4: No disc herniation. Facet and ligamentous hypertrophy present with mild  triangular configuration of the canal. No significant central stenosis. No  foraminal stenosis.    L4-L5: Posterior disc bulge. Mild spondylolisthesis noted. Severe facet disease  with hypertrophy and moderate bilateral facet joint effusion. Ligamentous  thickening also present. Resulting moderate central stenosis with triangular  configuration of the canal. Mild foraminal stenosis with no exiting nerve root  compression.    L5-S1: Mild posterior disc bulge. No significant central stenosis but no  foraminal stenosis. Facets grossly unremarkable.         Impression Impression: Most severe disease is at L4-L5, with spondylolisthesis and facet  disease, central stenosis as described.          Written by Sebastian Ache, [employer], as dictated by Delorise Chauncey Bruno, MD.   I, Dr. Delorise Gian Ybarra confirm that all documentation is accurate.

## 2015-12-29 NOTE — Patient Instructions (Signed)
Low Back Arthritis: Exercises  Your Care Instructions  Here are some examples of typical rehabilitation exercises for your condition. Start each exercise slowly. Ease off the exercise if you start to have pain.  Your doctor or physical therapist will tell you when you can start these exercises and which ones will work best for you.  When you are not being active, find a comfortable position for rest. Some people are comfortable on the floor or a medium-firm bed with a small pillow under their head and another under their knees. Some people prefer to lie on their side with a pillow between their knees. Don't stay in one position for too long.  Take short walks (10 to 20 minutes) every 2 to 3 hours. Avoid slopes, hills, and stairs until you feel better. Walk only distances you can manage without pain, especially leg pain.  How to do the exercises  Pelvic tilt    1. Lie on your back with your knees bent.  2. "Brace" your stomach???tighten your muscles by pulling in and imagining your belly button moving toward your spine.  3. Press your lower back into the floor. You should feel your hips and pelvis rock back.  4. Hold for 6 seconds while breathing smoothly.  5. Relax and allow your pelvis and hips to rock forward.  6. Repeat 8 to 12 times.  Back stretches    1. Get down on your hands and knees on the floor.  2. Relax your head and allow it to droop. Round your back up toward the ceiling until you feel a nice stretch in your upper, middle, and lower back. Hold this stretch for as long as it feels comfortable, or about 15 to 30 seconds.  3. Return to the starting position with a flat back while you are on your hands and knees.  4. Let your back sway by pressing your stomach toward the floor. Lift your buttocks toward the ceiling.  5. Hold this position for 15 to 30 seconds.  6. Repeat 2 to 4 times.  Follow-up care is a key part of your treatment and safety. Be sure to make  and go to all appointments, and call your doctor if you are having problems. It's also a good idea to know your test results and keep a list of the medicines you take.  Where can you learn more?  Go to http://www.healthwise.net/[employer].  Enter T094 in the search box to learn more about "Low Back Arthritis: Exercises."  Current as of: September 01, 2015  Content Version: 11.3  ?? 2006-2017 [employer]. Care instructions adapted under license by [employer] (which disclaims liability or warranty for this information). If you have questions about a medical condition or this instruction, always ask your healthcare professional. Healthwise, Incorporated disclaims any warranty or liability for your use of this information.

## 2016-01-01 ENCOUNTER — Inpatient Hospital Stay
Admit: 2016-01-01 | Discharge: 2016-01-01 | Disposition: A | Discharge status: Home / Self Care | Payer: MEDICARE | Attending: Emergency Medicine

## 2016-01-01 DIAGNOSIS — M79675 Pain in left toe(s): Secondary | ICD-10-CM

## 2016-01-01 NOTE — ED Notes (Signed)
I have reviewed discharge instructions with the patient.  The patient verbalized understanding.  Patient armband removed and shredded

## 2016-01-01 NOTE — ED Triage Notes (Signed)
Pt presents to the ED with left foot pain onset x3 days ago. Pt reports seen in this ED for left foot injury, states fitted with cast shoe. Pt denies pain improvement, reports continued pain. Pt would like to be evaluated because going out of town.

## 2016-01-01 NOTE — ED Provider Notes (Signed)
HPI Comments: 70 yr old female hx DM, GERD, htn, high cholesterol, osteopenia, and alzheimer's presents to the ED complaining of continued L toe pain after an injury 6 days ago. Pt was seen in the ED and had negative x-rays. Radiologist recommended repeat imaging in 10-14 days is sx persisted. Pt as not followed up with anyone since the ED visit. Denies new injury. No other complaints.     Patient is a 70 y.o. female presenting with toe pain.   Toe Pain    Pertinent negatives include no numbness, no back pain and no neck pain.        Past Medical History:   Diagnosis Date   ??? Alzheimer's dementia    ??? Arthritis    ??? Back pain    ??? Diabetes ([facility])    ??? GERD (gastroesophageal reflux disease)    ??? High cholesterol    ??? Hypertension    ??? Lumbar stenosis    ??? Osteopenia        Past Surgical History:   Procedure Laterality Date   ??? ABDOMEN SURGERY PROC UNLISTED      Scar tissue removed from abdomen   ??? HX COLECTOMY     ??? HX HERNIA REPAIR     ??? HX HYSTERECTOMY     ??? HX OTHER SURGICAL      Colon polyp removal         Family History:   Problem Relation Age of Onset   ??? Alzheimer Mother    ??? Stroke Mother    ??? Hypertension Mother    ??? Diabetes Mother    ??? Elevated Lipids Mother    ??? Hypertension Father    ??? Cancer Father      Prostate   ??? Other Father      Back problems   ??? Diabetes Sister    ??? Hypertension Sister    ??? Hypertension Brother    ??? Cancer Brother      Prostate x 4 brothers       Social History     Social History   ??? Marital status: MARRIED     Spouse name: N/A   ??? Number of children: N/A   ??? Years of education: N/A     Occupational History   ??? Not on file.     Social History Main Topics   ??? Smoking status: Former Smoker     Types: Cigarettes     Quit date: 05/12/1984   ??? Smokeless tobacco: Never Used   ??? Alcohol use No   ??? Drug use: No   ??? Sexual activity: Not on file     Other Topics Concern   ??? Not on file     Social History Narrative         ALLERGIES: Zocor [simvastatin]    Review of Systems    Constitutional: Negative.  Negative for chills, diaphoresis, fatigue and fever.   HENT: Negative.  Negative for congestion, ear pain, rhinorrhea and sore throat.    Eyes: Negative.  Negative for pain and redness.   Respiratory: Negative.  Negative for cough, shortness of breath, wheezing and stridor.    Cardiovascular: Negative.  Negative for chest pain, palpitations and leg swelling.   Gastrointestinal: Negative.  Negative for abdominal pain, constipation, diarrhea, nausea and vomiting.   Endocrine: Negative.    Genitourinary: Negative.  Negative for dysuria, flank pain, frequency and hematuria.   Musculoskeletal: Positive for arthralgias. Negative for back pain, myalgias, neck pain  and neck stiffness.   Skin: Negative.  Negative for rash and wound.   Allergic/Immunologic: Negative.    Neurological: Negative.  Negative for dizziness, seizures, syncope, numbness and headaches.   Hematological: Negative.    Psychiatric/Behavioral: Negative.    All other systems reviewed and are negative.      Vitals:    01/01/16 1428   BP: 146/80   Pulse: 62   Resp: 16   Temp: 98.3 ??F (36.8 ??C)   SpO2: 98%   Weight: 83.9 kg (185 lb)   Height: 5' 3.5" (1.613 m)            Physical Exam   Constitutional: She is oriented to person, place, and time. She appears well-developed and well-nourished. No distress.   HENT:   Head: Normocephalic.   Neck: Normal range of motion. Neck supple.   Cardiovascular: Normal rate, regular rhythm and normal heart sounds.  Exam reveals no gallop and no friction rub.    No murmur heard.  Pulmonary/Chest: Effort normal and breath sounds normal. No stridor. No respiratory distress. She has no wheezes. She has no rales.   Musculoskeletal: Normal range of motion. She exhibits edema.   Mild edema and TTP noted to the L 5th toe, no deformity noted, ROM intact, intact distal pulses   Neurological: She is alert and oriented to person, place, and time. Coordination normal.    Skin: Skin is warm and dry. No rash noted. She is not diaphoretic. No erythema.   Psychiatric: She has a normal mood and affect. Her behavior is normal. Thought content normal.   Nursing note and vitals reviewed.       MDM  Number of Diagnoses or Management Options  Pain of toe of left foot:   Diagnosis management comments: Impression: toe pain    Pt noted to have rx for celebrex and flexeril e-prescribed to her pharmacy from her PCP. Pt was unaware that this prescription was at the pharmacy. Pt agrees with the plan to start this medication and follow-up in 1 week with her PCP. Patient is stable for discharge at this time. No new rx given. Rest and follow-up with PCP this week. Return to the ED immediately for any new or worsening sx.  Cecelia Graciano J Oland Arquette, PA-C 2:38 PM     Risk of Complications, Morbidity, and/or Mortality  Presenting problems: minimal  Diagnostic procedures: minimal  Management options: minimal    Patient Progress  Patient progress: stable    ED Course       Procedures

## 2016-01-01 NOTE — ED Notes (Signed)
Attempted to discharge patient; she is standing outside talking on her phone and states she will be finished "in a minute".

## 2016-03-30 ENCOUNTER — Encounter: Attending: Family Medicine | Primary: Internal Medicine

## 2016-04-04 NOTE — Progress Notes (Signed)
 ASSESSMENT:   1-T2DM Metformin 500mg  bid januvia 100mg  Foot exam today 12/'16 eye 6/'17 tsh On ace/plavix/statin -hgba1c missed/not drawn-pt will need to be called back for same  2-HTN Lisinopril  40mg  Aldactone 12.5mg  Hydralazine 100mg  tid My check large cuff 138/80 -bmp, m/c  3-depression Chronic intermittent issue.  As reviewed with pt, could certainly increase zoloft dose vs. cymbalta which may help with diffuse DDD -cymbalta 30mg  daily -f/u in 4-6 wks  4-dementia Last neurology visit w/Dr Gust 02/2014 Continues on aricept  10mg  Last neuropsychology testing 08/2014 showed some deterioration as compared to 2014  5-HPL lipitor 10mg  Lab Results  Component Value Date/Time   CHOLESTEROL 157 09/03/2015 10:10 AM   HDL 50 09/03/2015 10:10 AM   LDLIPO 91 09/03/2015 10:10 AM   TRIGLYCERIDE 81 09/03/2015 10:10 AM   6-vitamin D def Pt reports compliant with weekly high dose -level  7-H.M. 9/'16 mammogram-reminded due 1/'11 dexa -rpt dexa  8-reflux Goal of limiting chronic PPI exposure -decrease prevacid 30mg  to qod dosing PLAN:   Discussed evaluation, treatment and usual course. Patient verbalizes understanding and agrees with plan of care. All questions were answered. Medication Orders this Encounter  Medications  . Insulin  Lispro (HUMALOG KWIKPEN) 100 unit/mL [facility] insulin  pen    Sig: Check sugar before meal and before bed If sugar 200-250 then 3 units If sugar 251-300 then 5 units If sugar 301-350 then 7 units If sugar 350-400 then 9 units If sugar >400 then 10 units    Dispense:  1 Box    Refill:  3  . DULoxetine (CYMBALTA) 30 mg PO CPDR    Sig: Take 1 Cap by Mouth Once a Day. Do not cut/crush/chew.    Dispense:  90 Cap    Refill:  1  . lansoprazole (PREVACID) 30 mg PO CPDR    Sig: Take 1 Cap by Mouth Every Other Day.   Side effects of prescribed medicine was discussed. Orders:   Orders Placed This Encounter  Procedures  . IMMUNIZATION ADM, SINGLE OR  COMBO VACCINE-FLU [[id number]]  MEDICARE ONLY  . FLU 0.5ML IM ([place] 65+ MEDICARE ONLY) VAC INJ  . COMPREHENSIVE METABOLIC PANEL  . MICROALBUMIN RANDOM URINE    Smoking status/exposure - Is there current tobacco use, second hand tobacco exposure, or environmental exposure? No   The patient's medicines, past medical, family and social histories (including tobacco usage) were reviewed and updated as appropriate.   SUBJECTIVE:   Chief Complaint  Patient presents with  . DIABETES  . HYPERTENSION  70y/o AAMF with type 2 diabetes, hypertension, dementia/depression, hyperlipidemia  ADMITS THAT MOOD UP AND DOWN 'I'm taking so many medications' She stopped taking zoloft 50mg  as felt not effective.  BPs at home:160/90 rare Recalls 140s systolic  No home blood sugar checks. Denies polyuria/polydipsia.  No feeling weak/diaphoretic or jittery HPI   Review of Systems  Constitutional: Negative for chills and fever.  Respiratory: Negative for cough and shortness of breath.   Cardiovascular: Negative for chest pain and leg swelling.       RARE FLUTTERING LASTING SECONDS  Gastrointestinal: Negative for abdominal pain, diarrhea, heartburn and nausea.  Neurological: Negative for dizziness and headaches.     OBJECTIVE   BP 150/88 (Site: Arm L, Position: Sitting)   Pulse 68   Temp 98.4 F (36.9 C) (Oral)   Resp 16   Ht 5' 3.5 (1.613 m)   Wt 87.4 kg (192 lb 9.6 oz)   BMI 33.58 kg/m  BMI: 33.58   Physical Exam  Constitutional: No distress.  Eyes: Conjunctivae are normal.  Cardiovascular: Normal rate, regular rhythm and intact distal pulses.  Exam reveals no gallop.   No murmur heard. No carotid bruit  Pulmonary/Chest: She has no wheezes. She has no rales.  Musculoskeletal: She exhibits no edema.  Neurological: She is alert.  Monofilament intact  Skin: She is not diaphoretic.  Psychiatric: Affect normal.  Nursing note and vitals reviewed.

## 2016-07-14 NOTE — Progress Notes (Signed)
 ASSESSMENT:   As detailed in the progress note below, the patient was assessed for:  (E55.9) Vitamin D deficiency - Plan: VENIPUNCTURE, SPECIMEN HANDLING FEE, VITAMIN D 25 HYDROXY  (E11.8,  E11.65) Uncontrolled type 2 diabetes mellitus with complication, without long-term current use of insulin  (HCC) - Plan: VENIPUNCTURE, SPECIMEN HANDLING FEE, MICROALBUMIN RANDOM URINE, HGB A1C WITH EST AVG GLUCOSE  (Z11.59) Need for hepatitis C screening test - Plan: VENIPUNCTURE, SPECIMEN HANDLING FEE, HEPATITIS C VIRAL ANTIBODY  (E11.59,  I10) Hypertension associated with diabetes (HCC) - Plan: VENIPUNCTURE, SPECIMEN HANDLING FEE, COMPREHENSIVE METABOLIC PANEL  1-T2DM Vitals 3/84/7982 04/04/2016 07/14/2016  Weight (lb) 194 lb 6.4 oz 192 lb 9.6 oz 189 lb 3.2 oz   10/'17 foot exam On ace/plavix/statin -3 mo f/u  2-HTN My check 132/70 Lisinopril  40mg  Metoprolol  100mg  bid Aldactone 12.5mg  daily Hydralazine 100mg  tid  Well controlled -bmp  3-dementia See in emr that pt contacted by neurology to schedule a f/u appointment.  When asked about it, pt admits that has message on her answering machine and promises to call to schedule same. She continues on aricept  10mg  daily She appears more confused today than at prior visits. Last neuropsychology testing 08/2014 had shown deteriarion as comcpared to 2014  4-HPL On lipitor 10mg , due for refill.  With prior stroke, lipitor dosing adjust to 40mg   6-reflux Pt asx She had not decreased prevacid to qod dosing as previously recommended.  Reminded to read instructions on bottle  7-vitamin D def Weekly high dose -level  8-H.M. Provided pt card for mammogram scheduling 1/'11 dexa-due for same-wrote on mammogram card that can be done on same day PLAN:   Discussed evaluation, treatment and usual course. Patient verbalizes understanding and agrees with plan of care. All questions were answered. Side effects of prescribed medicine was  discussed. Orders:   Orders Placed This Encounter  Procedures  . VENIPUNCTURE  . SPECIMEN HANDLING FEE  . COMPREHENSIVE METABOLIC PANEL  . HEPATITIS C VIRAL ANTIBODY  . VITAMIN D 25 HYDROXY  . HGB A1C WITH EST AVG GLUCOSE  . MICROALBUMIN RANDOM URINE    Smoking status/exposure - Is there current tobacco use, second hand tobacco exposure, or environmental exposure? No   The patient's medicines, past medical, family and social histories (including tobacco usage) were reviewed and updated as appropriate.   SUBJECTIVE:   Chief Complaint  Patient presents with  . DIABETES  . HYPERTENSION  . BLOOD DRAW   71y/o AAMF with type 2 diabetes, hypertension, depression, dementia, hyperlipidemia, vitamin D deficiency. Is 'commited to joining the [facility]' Did not bring her medications to visit. She is unable to tell me what medications she is taking. I called her at home and reviewed medications via phone.  Pt appears to not have gotten cymbalta that was prescribed 04/04/2016 visit.  Has not been checking blood pressure Has not been checking sugars.  No diaphoresis, feeling weak or jittery  HPI   Review of Systems  Constitutional: Negative for chills and fever.  Respiratory: Negative for cough and shortness of breath.   Cardiovascular: Negative for chest pain and leg swelling.       Fluttering a couple of times a week lasting seconds No associated SOB  Gastrointestinal: Negative for abdominal pain, diarrhea, heartburn and nausea.       Uses ex lax for constipation  Neurological: Negative for dizziness and headaches.     OBJECTIVE   BP 128/66 (Site: Arm L, Position: Sitting, Cuff Size: Medium)   Pulse 64  Temp 98.2 F (36.8 C) (Oral)   Resp 12   Ht 5' 3.5 (1.613 m) Comment: pt reported  Wt 85.8 kg (189 lb 3.2 oz)   BMI 32.99 kg/m  BMI: 32.99   Physical Exam  Constitutional: No distress.  Eyes: Conjunctivae are normal.  Cardiovascular: Normal rate and regular rhythm.   Exam reveals no gallop.   No murmur heard. Pulmonary/Chest: She has no wheezes. She has no rales.  Musculoskeletal: She exhibits no edema.  Neurological: She is alert.  Skin: She is not diaphoretic.  Psychiatric: Affect normal.  Nursing note and vitals reviewed.

## 2016-08-04 NOTE — Progress Notes (Signed)
 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 Last seen by the cognitive team in 2016. Her MOCA has remained relatively stable with deficits with set shifting and delayed recall. She was repetitive during exam today and had difficulty telling me key points from her medial history. 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   Plan: 1. Non e care referral was pleased for SLT   2. Discussed the importance of sleep. Melatonin 5-6 mg   3. She does not feel she needs medications to help with depression   4. Offered a consult with our LCSW. She will hold off at this time.   5. We discussed safety with driving. She presented by herself today and came to the appointment on time from [place].   6. She is interested in any research opportunities. She meet with our research coordinator today.   7. Follow up in 4 months with NP Thomas      40 minutes was spent face to face with > 50% time counseling and coordinating care.  Pertinent History Theresa Mendez  Was first evaluated by Dr. Gust 02/14/13.  Cognitive problems were first evident at work early 2014. She was initially seen by Dr. Rubin in March 2014 and had neuropsychological testing by Dr. Katrinka May 2014 notable for an amnestic pattern with poor memory storage, most suggestive of Alzheimer's pathology. She described feeling like cloudy thinking was made clearer with donepezil  10mg   Last visit 03/04/14 She came alone today.   She lives with her husband, but he has left sided weakness from a stroke in 2008. She does household chores, cooking, manages the finances with a couple being late only a few days. She uses a chart to keep them organized. She drives without getting lost.   She gets to bed by 1am and spontaneously wakes at 6am. She thinks she might be  able to fall asleep if she goes to bed earlier but she doesn't feel sleepy.   Her right foot spontaneously inverts at times. 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 Deette 09/02/14 with impression of mild level dementia.   She presented by herself and provided her own history she arrived early for her appointment from [place].  She feels she has been depressed lately she isn't able to connect in social networks any more. She wants to start going back to a book club and going back to the [facility]. Her sister works with a AD association in [place]  and asked about any potential research studies.   She is only sleeping 4-5 hours a night. She does not feel tired but knows it is not healthy to not sleep much.   Pertinent Data:  Imaging: MRI head 09/03/12 showed significant frontal, parietal and temporal atrophy. No significant vascular changes.  Home sleep test Feb 2014 with an Apnea link for approximately 3.75 hours total recording showed AHI 0.   Labs: Blood Lab Results  Component Value Date   NA 142 07/14/2016   GLUCOSE 160 (H) 07/14/2016   CALCIUM 9.5 07/14/2016   CREAT 0.7 (L) 07/14/2016   SGPTALT 10 07/14/2016   SGOTAST 15 07/14/2016   TSH 1.58 11/23/2015   WBC 7.3 09/03/2015   HCT 41.4 09/03/2015   HGBA1C 6.1 (H) 07/14/2016   CHOLESTEROL 157 09/03/2015   LDLIPO 91 09/03/2015  METHMALACD 68 09/14/2012    Cognitive Data: (2015) MoCA 16/30 - 0/5 free recall but got 1 with a cue and 3 with multiple choice. Qualitatively significantly worse copy of a cube and clock numbering compared to March 2014  [facility] 19/30 ( 08/04/16)   Past Medical History:  Diagnosis Date  . Back pain   . Colon polyp   . Diabetes mellitus (HCC)   . Esophageal reflux   . Hemorrhoid   . Hypercholesteremia   . Hypertension   . Knee pain   . Osteopenia   . Piercing   . Unspecified cerebral artery occlusion with cerebral  infarction      Past Surgical History:  Procedure Laterality Date  . COLON RESECTION  '00  . HERNIA REPAIR  2002  . LYSIS OF ADHESIONS  '01  . 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 PARTIAL HYSTERECTOMY  1989   for uterine fibroids     Allergies  Allergen Reactions  . Zocor [Simvastatin] muscle/joint pain    Current Outpatient Prescriptions on File Prior to Visit  Medication Sig Dispense Refill  . hydrALAzine (APRESOLINE) 100 mg PO TABS TAKE 1 TABLET BY MOUTH 3 TIMES DAILY. 270 Tab 1  . lisinopril  (PRINIVIL ) 40 mg PO TABS Take 1 Tab by Mouth Once a Day. 90 Tab 1  . metFORMIN (GLUCOPHAGE) 500 mg PO TABS Take 1 Tab by Mouth Twice Daily. 180 Tab 1  . metoprolol  (LOPRESSOR ) 100 mg PO TABS Take 1 Tab by Mouth Twice Daily. 180 Tab 1  . clopidogrel (PLAVIX) 75 mg PO TABS TAKE 1 TABLET BY MOUTH ONCE A DAY. 90 Tab 1  . sitaGLIPtin (JANUVIA) 100 mg PO TABS TAKE 1 TABLET BY MOUTH ONCE A DAY. 90 Tab 1  . donepezil  (ARICEPT ) 10 mg PO TABS Take 1 Tab by Mouth Every Night at Bedtime. 90 Tab 0  . ergocalciferol (VITAMIN D2) 50,000 unit PO CAPS TAKE 1 CAPSULE BY MOUTH EVERY 7 DAYS. 12 Cap 0  . spironolactone (ALDACTONE) 25 mg PO TABS TAKE 1/2 TABLET BY MOUTH ONCE A DAY. 45 Tab 1  . lansoprazole (PREVACID) 30 mg PO CPDR Take 1 Cap by Mouth Every Other Day. 45 Cap 0  . atorvastatin (LIPITOR) 40 mg PO TABS Take 1 Tab by Mouth Every Night at Bedtime. 90 Tab 0  . Insulin  Lispro (HUMALOG KWIKPEN) 100 unit/mL [facility] insulin  pen Check sugar before meal and before bed If sugar 200-250 then 3 units If sugar 251-300 then 5 units If sugar 301-350 then 7 units If sugar 350-400 then 9 units If sugar >400 then 10 units 1 Box 3  . DULoxetine (CYMBALTA) 30 mg PO CPDR Take 1 Cap by Mouth Once a Day. Do not cut/crush/chew. 90 Cap 1  . Blood Sugar Diagnostic (BLOOD GLUCOSE TEST) Misc STRP ONE TOUCH ULTRA 2 TEST STRIPS TEST GLUCOSE DAILY DX E11.9 3 Box 3  . Insulin  Pen Needles, Disposable, 31 x 3/16  Misc  Ndle 1 Each by Combination route Once a Day. 1 Box 3  . TYPE-IN MEDICATION 1 Dose by Misc.(Non-Drug; Combo Route) route As Directed. Glucometer 1 Each 0  . cyclobenzaprine (FLEXERIL) 10 mg PO TABS Take 5 mg by Mouth Every 8 Hours As Needed.     No current facility-administered medications on file prior to visit.    ROS reviewed positives within the HPI   PE:   There were no vitals filed for this visit. There is no height or weight  on file to calculate BMI.  Appeared well, no distress.  No edema  Neurologically the patient was alert and cooperative.  Language was fluent, with normal comprehension and grammar.  Discourse was organized. Fund of knowledge was normal.   Affect was euthymic.  She was oriented in detail and attentive to the conversation. Memory for personal historical details was impaired She provided her own history. She did repeat 2 stories during our short time together. I am unsure if she was trying to emphasize a point.   MOCA below   PERRL.  Visual fields were full to confrontation. EOM were full without nystagmus. There was normal facial sensation and excursion. Hearing was normal. Palate and tongue moved in the midline. Normal SCM/trapezius.    Tone was mildly increased in the right wrist, no cogwheeling  + Myerson's sign   Muscle strength:   Right  Left   deltoid  5/5  5/5   biceps  5/5  5/5   triceps  5/5  5/5   Wrist flexors 5/5  5/5   Wrist extensors  5/5  5/5   Finger flexors 5/5  5/5   Finger extensors 5/5  5/5     Right  Left   Hip flexion  5/5  5/5   Knee flexion 5/5 5/5  Knee extension  5/5  5/5     Finger tapping with slight decrease amplitude   Sensation was intact to touch throughout.   Gait testing showed normal base, stride, posture, and armswing.

## 2016-10-11 NOTE — Progress Notes (Signed)
 Medicare Wellness Exam Theresa Mendez, female, medical record number [id number], DOB:1945-11-28 presents today for her wellness visit.  Type of Medicare exam completed: Annual Wellness Visit - Subsequent. There is not a caregiver present.  Vital signs: BP 126/84 (Site: Arm L, Position: Sitting, Cuff Size: Medium)   Pulse 74   Temp 98.6 F (37 C) (Oral)   Resp 18   Ht 5' 3 (1.6 m)   Wt 88.4 kg (194 lb 12.8 oz)   BMI 34.51 kg/m   Patient Active Problem List  Diagnosis  . Breast Calcification Seen on Mammogram-right 6//'10-[facility]  . 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 DJD (degenerative joint disease), cervical-c spine CT 11/'10  . LVH (left ventricular hypertrophy)-mild concentric 1/'11 echo  . Osteopenia-2/'18 spine -1.4, b/l hip -1.7, 1/'11 DEXA spine=-1.2, hips intact  . Vitamin D deficiency  . 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 S/P colonoscopy with polypectomy-10/05/2011 [place]  . Dementia  . Anxiety and depression  . Hyperlipidemia associated with type 2 diabetes mellitus (HCC)  . Hypertension associated with diabetes (HCC)  . Uncontrolled type 2 diabetes mellitus with complication, without long-term current use of insulin  [facility])   Past Medical History:  Diagnosis Date  . Back pain   . Colon polyp   . Diabetes mellitus (HCC)   . Esophageal reflux   . Hemorrhoid   . Hypercholesteremia   . Hypertension   . Knee pain   . Osteopenia   . Piercing   . Unspecified cerebral artery occlusion with cerebral infarction    Immunization History  Administered Date(s) Administered  . Afluria (Medicare) 03/01/2012, 04/24/2014, 03/17/2015, 04/04/2016  . Fluvirin (Medicare Only) 04/18/2011  . INFLU VAC 0.5 mL IM INJ 03/23/2009, 03/21/2013  . PNEUMO 0.60mL(PNEUMOVAX 23)VAC Inj 04/18/2011  . Prevnar (Pneum 13) Imm 01/23/2014  . TDAP 2.5-8-5 Lf-mcg-lf/o.5 Ml(BOOSTRIX) Vac 01/26/2009  . VARICELLA-ZOSTER(ZOSTAVAX->39yrs)VAC INJ 06/23/2008    Medications:  has a current medication list which includes the following prescription(s): atorvastatin, blood sugar diagnostic, clopidogrel, cyclobenzaprine, donepezil , duloxetine, ergocalciferol, hydralazine, insulin  lispro, insulin  pen needles (disposable), lansoprazole, lisinopril , metformin, metoprolol , sitagliptin, spironolactone, and TYPE-IN MEDICATION. Family History: family history includes Alcohol abuse in her brother, father, maternal grandfather, and paternal grandfather; Asthma in her mother; Cancer in her maternal aunt; Cancer, Prostate in her brother and father; Diabetes in her maternal aunt, maternal uncle, paternal aunt, paternal uncle, and sister; Heart Disease in her paternal grandmother; High Cholesterol in her brother, mother, and sister; Hypertension in her brother, father, mother, and sister; Stroke in her mother. Social History:  reports that she has quit smoking. She has never used smokeless tobacco. She reports that she does not drink alcohol or use drugs., Counseling given: Yes   Concerns of excessive alcohol use?No Concerns of drug use: No  The patient states they exercise >/= to 2.5 hours per week:No, counseled on light to moderate aerobic exercise 2.5 hours per week.  Do you have a caregiver or someone designated to help you at home?none  Do you need assistance with the phone, transportation,shopping,meal preparation,housework, taking/obtaining medications, managing your finances or other activities of daily living? No If you use a caregiver are they meeting these needs?  NA  Incontinence Screening:  Have you had problems with bladder control? no  Depression Screening: 1. Over the past two weeks have you felt down, depressed or hopeless? Yes  2. Over the past two weeks have you experienced little interest or pleasure in doing things?No  PHQ9 Score:  Do you feel your mental health has SIGNIFICANTLY declined over the past 2 years?Yes, counseled to review current  PPPS/Care Plan on the AVS for improved Self-management  Cognitive Screening Perform the following tests: Three word recall (1 point for each word correct) Clock Draw test (1 point if all numbers are correct and 1 point for hand positions correct) Mini Cognitive score 2 / 5 (normal is 4 or 5)  Function Screening: Do you feel your physical health has SIGNIFICANTLY declined over the past 2 years? no  Have you lost 10lbs in weight over the last 6 months without trying to do so? no  Pain Screening: Is pain currently interfering with your daily activities:no  Safety/Fall Risk Screening: (test: Arise from chair, walk 10 feet, turn and return to chair, turn and sit. 3 trials allowed, may be averaged) 1. Was the patient's timed Get Up and Go Test unsteady or longer than 20 seconds? No 2. Have you had any falls within the last year? No 3. Have you had a fracture in the last year? No 4. Does your home lack handrails, have poor lighting or throw rugs in the hallways? No  Hearing Loss Screening Do you have difficulty with your hearing? No  Vision Examination (required by IPPE only)  Visual Acuity Screening   Right eye Left eye Both eyes  Without correction: 20/40 20/40 20/40   With correction:       Glaucoma Screening Have you had an eye exam with an Ophthalmologist in the last year? no  Advance Care Planning Explanation of the purpose of Advance Care Planning has been discussed with Patient. The following materials were given: Patient declined/already has Information or has an Advance Care Plan on file. Documentation in EMR:   Advance Care Plan: is on file.  Reviewed current ACP: Not applicable Does this individual have the ability to prepare an Advanced Care Plan? yes  Preventative Care Review: The patient's preventative care was reviewed? Yes Health Maintenance  Topic Date Due  . COLOGUARD/COLON CANCER SCREENING  02/09/1996  . PHYSICAL/EXAM  08/04/2010  . FIT/COLON CANCER  SCREENING YEARLY  08/04/2010  . PAP/ CERVICAL CANCER SCREENING  02/09/2011  . BREAST EXAM  01/10/2015  . MAMMOGRAM/BREAST CANCER SCREENING  02/12/2016  . LIPID SCREENING  09/02/2016  . ANNUAL MEDICARE WELLNESS VISIT  09/02/2016  . COLONOSCOPY/COLON CANCER SCREENING  10/04/2016  . A1C  01/11/2017  . FLU VACCINE  01/11/2017  . DIABETIC FOOT EXAM  04/04/2017  . OBESITY SCREENING  04/04/2017  . CREATININE (KIDNEY FUNCTION TEST)  07/14/2017  . URINE MICROALBUMIN  07/14/2017  . DIABETIC EYE EXAM  09/01/2017  . BLOOD PRESSURE  04/04/2018  . OSTEOPOROSIS SCREENING  08/03/2018  . TD VACCINE  01/27/2019  . TDAP VACCINE  Completed  . ADVANCE DIRECTIVE COUNSELING  Completed  . 25 HYDROXY VITAMIN D TEST  Completed  . ALCOHOL SCREENING  Completed  . TOBACCO SCREENING  Completed  . PNEUMO VACCINE 65+ LOW RISK  Completed  . HEP-C SCREENING  Completed  . SHINGLES VACCINE  Completed    Current list of Health Care Providers: Patient Care Team: Theresa Mendez, Theresa MATSU, MD as PCP - Diedre Tessa Stagger, MD (Surgery) Reynaldo Garnette HERO, OD as Consulting Provider (Ophthalmology) Jock Donnice BROCKS, DPM as Consulting Provider (Podiatry) Alaine Oneil NOVAK, MD as Consulting Provider (Orthopedic Surgery)  PPPS/Care Plan was reviewed and a copy given to the patient.

## 2016-11-14 NOTE — Progress Notes (Signed)
 [facility] Sleep Disorders [address] [place], [place] [address] Phone: [phone] Fax: [phone]      ASSESSMENT  1. 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 Her MOCA today remains stable, 19/30 with continued deficits and delayed recall missing one-point and set shifting.  She maintains the ability to do IADLs and ADLs. 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   2. Poor sleep. She has had difficulty with sleep getting 5-6 hours of sleep per night.   PLAN  1. Encouraged being more sociable with friends.   2. Will refer to speech therapy for cognitive rehab. Requests that the referral be sent to [facility] in [place].   3. Can try Melatonin 5-6 mg to be taken 3 hours before bedtime.   4. Follow up in 6 months.  5.  Ms. Bosket asked about options to slow the progression of degenerative process. Discussed that there is only symptomatic treatment at this time, but we do have research options available at this time. She is interested in pursing this. Provided the contact for Mrs. Adine.   45 minutes was spent face to face with > 50% time counseling and coordinating care.   Note: This was dictated using a computer transcription program. Although proofread, it may contain computer transcription errors and phonetic errors. Other human proofreading errors may also exist. Corrections may be performed at a later time. Please contact us  for any clarification if needed.  PERTINENT HISTORY   Referring Provider: Ella KATHEE Debby Lynder LITTIE Teresa presents to   Pertinent History Mrs. Zufall  Was first evaluated by Dr. Gust 02/14/13. Cognitive problems were first evident at work early 2014. She was initially seen by Dr. Rubin in March 2014 and had neuropsychological testing by Dr. Katrinka May 2014 notable for an amnestic pattern with poor memory storage, most suggestive of  Alzheimer's pathology. She described feeling like cloudy thinking was made clearer with donepezil  10 mg.  Ms. Schiano was evaluated on March 04, 2014. She lives with her husband who had a stroke in 2008 which affected his left side. She does household chores, cooking, manages the finances with a couple being late only a few days. She uses a chart to keep them organized. She drives without getting lost.   Her right foot spontaneously inverts at times. She attributed this to lumbar spinal stenosis  Mrs. Gonzaga was evaluated by Dr. Deette on 09/02/14 with impression of mild level dementia.  She presented alone to her follow-up in February.  At that time, she described feeling depressed due to being unable to connect in social networks. She wants to start going back to a book club and going back to the [facility]. Her sister works with a AD association in [place]  and asked about any potential research studies. Over time, she is described sleeping between 4-5 hours per night, going to bed at 1 AM arising at 6 AM. Despite the little sleep, she spontaneously arises. She thinks she might be able to fall asleep if she goes to bed earlier but she doesn't feel sleepy.   Interval History 11/14/2016: Ms. Geng states that she has noticed a change in her memory. She has difficulty with recall and she does not store information. Reading well passages causes her to lose track of the details. 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 She also has background in psychology.  She feels  that she is not motivated to do things, but if she would get out and make an effort, it would not be a challenge. She plans to start going to the [facility].  She has not been socially active citing being afraid to spend time with her friends due to them noticing a change in her memory.  She does have one friend that recently wrote a book whom also has a memory problem.  She says  that she has confided and had her before, but it does not see her as much anymore. Regarding activities of daily living, she manages her medications independently, she continues to drive, cook, and dress herself.  She continues to have difficulty with sleep.  She falls asleep at 12 or 1 AM and awakens between 6 and 7 AM.  She describes not feeling rested.  She has no difficulty falling asleep nor does she nap during the day.  She drinks sleepy time tea.  PERTINENT DATA   IMAGING  MRI head 09/03/12 showed significant frontal, parietal and temporal atrophy. No significant vascular changes.   COGNITIVE DATA (2015) MoCA 16/30 - 0/5 free recall but got 1 with a cue and 3 with multiple choice. Qualitatively significantly worse copy of a cube and clock numbering compared to March 2014, 19/30 (08/04/16), 19/30 (11/14/2016)  LABS  Blood Lab Results  Component Value Date   NA 142 07/14/2016   GLUCOSE 160 (H) 07/14/2016   CALCIUM 9.5 07/14/2016   CREAT 0.7 (L) 07/14/2016   SGPTALT 10 07/14/2016   SGOTAST 15 07/14/2016   TSH 1.58 11/23/2015   WBC 7.3 09/03/2015   HCT 41.4 09/03/2015   HGBA1C 6.1 (H) 07/14/2016   CHOLESTEROL 157 09/03/2015   LDLIPO 91 09/03/2015   METHMALACD 68 09/14/2012   SEDRATE 12.0 01/23/2014     PAST HISTORY   Past Medical History:  Diagnosis Date  . Back pain   . Colon polyp   . Diabetes mellitus (HCC)   . Esophageal reflux   . Hemorrhoid   . Hypercholesteremia   . Hypertension   . Knee pain   . Osteopenia   . Piercing   . Unspecified cerebral artery occlusion with cerebral infarction      Past Surgical History:  Procedure Laterality Date  . COLON RESECTION  '00  . HERNIA REPAIR  2002  . LYSIS OF ADHESIONS  '01  . 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 PARTIAL HYSTERECTOMY  1989   for uterine fibroids     Allergies  Allergen Reactions  . Zocor [Simvastatin] muscle/joint pain    Family History  Problem Relation Age of Onset  .  Alcohol abuse Father   . Cancer, Prostate Father   . Hypertension Father   . Asthma Mother   . Hypertension Mother   . High Cholesterol Mother   . Stroke Mother   . Diabetes Sister   . Hypertension Sister   . High Cholesterol Sister   . Alcohol abuse Brother   . Cancer, Prostate Brother   . Hypertension Brother   . High Cholesterol Brother   . Diabetes Maternal Aunt   . Cancer Maternal Aunt     Lung Cancer- Mom's half sister Share Father  . Diabetes Paternal Aunt   . Diabetes Maternal Uncle   . Diabetes Paternal Uncle   . Heart Disease Paternal Grandmother   . Alcohol abuse Maternal Grandfather   . Alcohol abuse Paternal Grandfather      Social History  Social History  . Marital status: Married    Spouse name: N/A  . Number of children: N/A  . Years of education: N/A   Social History Main Topics  . Smoking status: Former Games developer  . Smokeless tobacco: Never Used     Comment: quit 1990's  . Alcohol use No  . Drug use: No  . Sexual activity: Yes    Partners: Male   Other Topics Concern  . Caffeine Concern No  . Exercise No  . Seat Belt Yes  . Self-Exams No   Social History Narrative  . No narrative on file      MEDICATIONS   Outpatient Prescriptions Marked as Taking for the 11/14/16 encounter (Office Visit) with Debby Ella NOVAK, NP  Medication Sig Dispense Refill  . atorvastatin (LIPITOR) 40 mg PO TABS Take 1 Tab by Mouth Every Night at Bedtime. 90 Tab 1  . Blood Sugar Diagnostic (BLOOD GLUCOSE TEST) Misc STRP ONE TOUCH ULTRA 2 TEST STRIPS TEST GLUCOSE DAILY DX E11.9 3 Box 3  . clopidogrel (PLAVIX) 75 mg PO TABS TAKE 1 TABLET BY MOUTH ONCE A DAY. 90 Tab 1  . cyclobenzaprine (FLEXERIL) 10 mg PO TABS Take 5 mg by Mouth Every 8 Hours As Needed.    . donepezil  (ARICEPT ) 10 mg PO TABS Take 1 Tab by Mouth Every Night at Bedtime. 90 Tab 0  . Duloxetine (CYMBALTA) 60 mg PO CPDR Take 1 Cap by Mouth Once a Day. Do not cut/ crush/chew. 90 Cap 1  . ergocalciferol  (VITAMIN D2) 50,000 unit PO CAPS TAKE 1 CAPSULE BY MOUTH EVERY 7 DAYS. 12 Cap 1  . Insulin  Pen Needles, Disposable, 31 x 3/16  Misc Ndle 1 Each by Combination route Once a Day. 1 Box 3  . lansoprazole (PREVACID) 30 mg PO CPDR Take 1 Cap by Mouth Every Other Day. 45 Cap 1  . lisinopril  (PRINIVIL ) 40 mg PO TABS Take 1 Tab by Mouth Once a Day. 90 Tab 1  . metFORMIN (GLUCOPHAGE) 500 mg PO TABS Take 1 Tab by Mouth Twice Daily. 180 Tab 1  . metoprolol  (LOPRESSOR ) 100 mg PO TABS Take 1 Tab by Mouth Twice Daily. 180 Tab 1  . sitaGLIPtin (JANUVIA) 100 mg PO TABS TAKE 1 TABLET BY MOUTH ONCE A DAY. 90 Tab 1  . spironolactone (ALDACTONE) 25 mg PO TABS TAKE 1/2 TABLET BY MOUTH ONCE A DAY. 45 Tab 1  . TYPE-IN MEDICATION 1 Dose by Misc.(Non-Drug; Combo Route) route As Directed. Glucometer 1 Each 0    ROS  Positives in [place] (otherwise negative)  Neurological: memory loss, poor concentration, headache, double vision, slurred speech, imbalance/unsteady gait, falls, restless feeling in legs, abnormal movements, tremor, numbness/tingling, weakness, cramps/spasms, loss of sense of smell General: excessive fatigue, fever, chills, night sweats, excessive daytime sweating, weight gain, weight loss Sleep: trouble sleeping, loud snoring, stop breathing during sleep, vivid dreams, abnormal movements during sleep Eyes/Ears/Mouth: visual loss, blurred vision, excessively dry eyes, eye pain, hearing loss, ringing in ears, unexplained dry mouth Throat/sinus: nasal congestion, sinus pain, swallowing problems Neck: neck stiffness, swollen lymph nodes Pulmonary: shortness of breath, excessive coughing Cardiac: chest pain, heart racing, passing out, lightheaded with standing Vascular: swollen legs, easy bruising or bleeding, recent transfusions GI: constiptation, diarrhea, incontinence (bowel accidents) Genitourinary: frequent urination, incontinence (bladder accidents), too little warning before urination, incomplete bladder  emptying, trouble getting an erection, vaginal dryness Musculoskeletal: joint pain, muscle aches Psychiatric: depression, anxiety, high levels of stress, hallucinations, compulsive behaviors, laughing or  crying for no reason   EXAM   Vitals:   11/14/16 1429  BP: 140/64  Site: Arm R  Position: In chair  Cuff Size: Small  Pulse: 61  SpO2: 98%  Weight: 88 kg (194 lb)   Body mass index is 34.37 kg/m.  Appeared well, no distress.  Oropharynx was clear. No edema  Neurologically the patient was alert and cooperative.  Language was fluent, with normal comprehension and grammar.  Discourse was organized. Fund of knowledge was normal.   Affect was euthymic.  She was oriented in detail and attentive to the conversation. Memory for personal historical details was normal.   PERRL. Visual fields were full to confrontation. EOM were full without nystagmus. There was normal facial sensation and excursion. Hearing was normal. Palate and tongue moved in the midline. Normal SCM/trapezius.    There was no pronator drift, asterixis, or tremor. There was normal bulk. Tone was normal.   Muscle strength:   Right  Left   deltoid  5/5  5/5   biceps  5/5  5/5   triceps  5/5  5/5   Finger flexors 5/5  5/5   Finger extensors 5/5  5/5     Right  Left   Hip flexion  5/5  5/5   Knee flexion 5/5 5/5  Knee extension  5/5  5/5   Ankle dorsiflexion  5/5  5/5   Plantar flexion  5/5  5/5     Finger tapping and finger to nose were normal.   Deep tendon reflexes at biceps, BR, and patella were normal and symmetric. Plantar responses were flexor.   Ella Ned, NP-C

## 2017-02-13 ENCOUNTER — Emergency Department: Admit: 2017-02-13 | Payer: MEDICARE | Primary: Internal Medicine

## 2017-02-13 ENCOUNTER — Inpatient Hospital Stay
Admit: 2017-02-13 | Discharge: 2017-02-13 | Disposition: A | Discharge status: Home / Self Care | Payer: MEDICARE | Attending: Emergency Medicine

## 2017-02-13 DIAGNOSIS — R600 Localized edema: Secondary | ICD-10-CM

## 2017-02-13 LAB — METABOLIC PANEL, COMPREHENSIVE
A-G Ratio: 0.9 (ref 0.8–1.7)
ALT (SGPT): 18 U/L (ref 13–56)
AST (SGOT): 16 U/L (ref 15–37)
Albumin: 3.5 g/dL (ref 3.4–5.0)
Alk. phosphatase: 101 U/L (ref 45–117)
Anion gap: 9 mmol/L (ref 3.0–18)
BUN/Creatinine ratio: 12 (ref 12–20)
BUN: 10 MG/DL (ref 7.0–18)
Bilirubin, total: 0.2 MG/DL (ref 0.2–1.0)
CO2: 27 mmol/L (ref 21–32)
Calcium: 9 MG/DL (ref 8.5–10.1)
Chloride: 108 mmol/L (ref 100–108)
Creatinine: 0.81 MG/DL (ref 0.6–1.3)
GFR est AA: >60 mL/min/{1.73_m2} (ref >60)
GFR est non-AA: >60 mL/min/{1.73_m2} (ref >60)
Globulin: 3.7 g/dL (ref 2.0–4.0)
Glucose: 127 mg/dL — ABNORMAL HIGH (ref 74–99)
Potassium: 3.7 mmol/L (ref 3.5–5.5)
Protein, total: 7.2 g/dL (ref 6.4–8.2)
Sodium: 144 mmol/L (ref 136–145)

## 2017-02-13 LAB — CARDIAC PANEL,(CK, CKMB & TROPONIN)
CK - MB: <1 ng/ml (ref <3.6)
CK: 161 U/L (ref 26–192)
Troponin-I, QT: <0.02 NG/ML (ref 0.00–0.06)

## 2017-02-13 LAB — CBC WITH AUTOMATED DIFF
ABS. BASOPHILS: 0 10*3/uL (ref 0.0–0.1)
ABS. EOSINOPHILS: 0.1 10*3/uL (ref 0.0–0.4)
ABS. LYMPHOCYTES: 2.3 10*3/uL (ref 0.9–3.6)
ABS. MONOCYTES: 0.5 10*3/uL (ref 0.05–1.2)
ABS. NEUTROPHILS: 4.1 10*3/uL (ref 1.8–8.0)
BASOPHILS: 0 % (ref 0–2)
EOSINOPHILS: 2 % (ref 0–5)
HCT: 35.5 % (ref 35.0–45.0)
HGB: 11.3 g/dL — ABNORMAL LOW (ref 12.0–16.0)
LYMPHOCYTES: 32 % (ref 21–52)
MCH: 30.1 PG (ref 24.0–34.0)
MCHC: 31.8 g/dL (ref 31.0–37.0)
MCV: 94.7 FL (ref 74.0–97.0)
MONOCYTES: 7 % (ref 3–10)
MPV: 10.1 FL (ref 9.2–11.8)
NEUTROPHILS: 59 % (ref 40–73)
PLATELET: 243 10*3/uL (ref 135–420)
RBC: 3.75 M/uL — ABNORMAL LOW (ref 4.20–5.30)
RDW: 12.9 % (ref 11.6–14.5)
WBC: 7 10*3/uL (ref 4.6–13.2)

## 2017-02-13 LAB — NT-PRO BNP: NT pro-BNP: 394 PG/ML (ref 0–900)

## 2017-02-13 NOTE — ED Triage Notes (Signed)
C/O right foot swelling since Sat.

## 2017-02-13 NOTE — ED Provider Notes (Signed)
EMERGENCY DEPARTMENT HISTORY AND PHYSICAL EXAM    2:07 PM      Date: 02/13/2017  Patient Name: Angela Potter    History of Presenting Illness     Chief Complaint   Patient presents with   ??? Foot Swelling         History Provided By: Patient    Chief Complaint: right foot swelling  Duration:  saturday evening  Timing:  Worsening  Location: right foot  Severity: Mild  Modifying Factors: Pt noticed it while she was driving.   Associated Symptoms: Denies fever or any trauma.      Additional History (Context): Angela Potter is a 71 y.o. female with DM, HTN, GERD, Alzheimer's dementia, Osteopenia, Arthritis  who presents with mild right foot swelling that has been worsening since Saturday evening. Denies fever or any trauma. Pt noticed the swelling while she was driving  Her blood sugar has been in the 180's. Pt is a former smoker and does not drink/do drugs.    PCP: Garrel Ridgel, MD    Current Outpatient Prescriptions   Medication Sig Dispense Refill   ??? sitagliptin phosphate (JANUVIA PO) Take  by mouth.     ??? celecoxib (CELEBREX) 200 mg capsule 1 po daily as needed; take with food 30 Cap 3   ??? metFORMIN (GLUCOPHAGE) 500 mg tablet Take  by mouth two (2) times daily (with meals).     ??? ergocalciferol (ERGOCALCIFEROL) 50,000 unit capsule Take 50,000 Units by mouth every seven (7) days.     ??? lansoprazole (PREVACID) 30 mg capsule Take  by mouth Daily (before breakfast).     ??? lisinopril (PRINIVIL, ZESTRIL) 40 mg tablet Take 40 mg by mouth daily.     ??? SPIRONOLACTONE (ALDACTONE PO) Take 25 mg by mouth daily. Takes 1/2 tab daily     ??? hydrALAZINE (APRESOLINE) 100 mg tablet Take 100 mg by mouth three (3) times daily.     ??? metoprolol (LOPRESSOR) 100 mg tablet Take  by mouth two (2) times a day.     ??? atorvastatin (LIPITOR) 10 mg tablet Take 10 mg by mouth daily.     ??? clopidogrel (PLAVIX) 75 mg tablet Take  by mouth daily.     ??? donepezil (ARICEPT) 10 mg tablet Take 10 mg by mouth nightly.         Past History      Past Medical History:  Past Medical History:   Diagnosis Date   ??? Alzheimer's dementia    ??? Arthritis    ??? Back pain    ??? Diabetes ([place])    ??? GERD (gastroesophageal reflux disease)    ??? High cholesterol    ??? Hypertension    ??? Lumbar stenosis    ??? Osteopenia        Past Surgical History:  Past Surgical History:   Procedure Laterality Date   ??? ABDOMEN SURGERY PROC UNLISTED      Scar tissue removed from abdomen   ??? HX COLECTOMY     ??? HX HERNIA REPAIR     ??? HX HYSTERECTOMY     ??? HX OTHER SURGICAL      Colon polyp removal       Family History:  Family History   Problem Relation Age of Onset   ??? Alzheimer Mother    ??? Stroke Mother    ??? Hypertension Mother    ??? Diabetes Mother    ??? Elevated Lipids Mother    ???  Hypertension Father    ??? Cancer Father      Prostate   ??? Other Father      Back problems   ??? Diabetes Sister    ??? Hypertension Sister    ??? Hypertension Brother    ??? Cancer Brother      Prostate x 4 brothers       Social History:  Social History   Substance Use Topics   ??? Smoking status: Former Smoker     Types: Cigarettes     Quit date: 05/12/1984   ??? Smokeless tobacco: Never Used   ??? Alcohol use No       Allergies:  Allergies   Allergen Reactions   ??? Zocor [Simvastatin] Unknown (comments)         Review of Systems       Review of Systems   Constitutional: Negative.  Negative for chills, diaphoresis and fever.   HENT: Negative.  Negative for congestion, rhinorrhea and sore throat.    Eyes: Negative.  Negative for pain, discharge and redness.   Respiratory: Negative.  Negative for cough, chest tightness, shortness of breath and wheezing.    Cardiovascular: Positive for leg swelling (right foot). Negative for chest pain.   Gastrointestinal: Negative.  Negative for abdominal pain, constipation, diarrhea, nausea and vomiting.   Genitourinary: Negative.  Negative for dysuria, flank pain, frequency, hematuria and urgency.   Musculoskeletal: Negative.  Negative for back pain and neck pain.    Skin: Negative.  Negative for rash.   Neurological: Negative.  Negative for syncope, weakness, numbness and headaches.   Psychiatric/Behavioral: Negative.    All other systems reviewed and are negative.        Physical Exam     Visit Vitals   ??? BP 183/78 (BP 1 Location: Left arm, BP Patient Position: At rest)   ??? Pulse 70   ??? Temp 98.3 ??F (36.8 ??C)   ??? Resp 16   ??? Ht 5' 3.5" (1.613 m)   ??? Wt 81.6 kg (180 lb)   ??? SpO2 99%   ??? BMI 31.39 kg/m2         Physical Exam   Constitutional: She is oriented to person, place, and time. She appears well-developed and well-nourished.  Non-toxic appearance. She does not have a sickly appearance. She does not appear ill. No distress.   HENT:   Head: Normocephalic and atraumatic.   Mouth/Throat: Oropharynx is clear and moist. No oropharyngeal exudate.   Eyes: Conjunctivae and EOM are normal. Pupils are equal, round, and reactive to light. No scleral icterus.   Neck: Normal range of motion. Neck supple. No hepatojugular reflux and no JVD present. No tracheal deviation present. No thyromegaly present.   Cardiovascular: Normal rate, regular rhythm, S1 normal, S2 normal, normal heart sounds, intact distal pulses and normal pulses.  Exam reveals no gallop, no S3 and no S4.    No murmur heard.  Pulses:       Radial pulses are 2+ on the right side, and 2+ on the left side.        Dorsalis pedis pulses are 2+ on the right side, and 2+ on the left side.   Pulmonary/Chest: Effort normal and breath sounds normal. No respiratory distress. She has no decreased breath sounds. She has no wheezes. She has no rhonchi. She has no rales.   Abdominal: Soft. Normal appearance and bowel sounds are normal. She exhibits no distension and no mass. There is no hepatosplenomegaly. There is  no tenderness. There is no rigidity, no rebound, no guarding, no CVA tenderness, no tenderness at McBurney's point and negative Murphy's sign.   Musculoskeletal: Normal range of motion.   2+ pitting edema     Lymphadenopathy:        Head (right side): No submental, no submandibular, no preauricular and no occipital adenopathy present.        Head (left side): No submental, no submandibular, no preauricular and no occipital adenopathy present.     She has no cervical adenopathy.        Right: No supraclavicular adenopathy present.        Left: No supraclavicular adenopathy present.   Neurological: She is alert and oriented to person, place, and time. She has normal strength and normal reflexes. She is not disoriented. No cranial nerve deficit or sensory deficit. Coordination and gait normal. GCS eye subscore is 4. GCS verbal subscore is 5. GCS motor subscore is 6.   Grossly intact    Skin: Skin is warm, dry and intact. No rash noted. She is not diaphoretic.   Psychiatric: She has a normal mood and affect. Her speech is normal and behavior is normal. Judgment and thought content normal. Cognition and memory are normal.   Nursing note and vitals reviewed.        Diagnostic Study Results     Labs -  Recent Results (from the past 12 hour(s))   CBC WITH AUTOMATED DIFF    Collection Time: 02/13/17  2:40 PM   Result Value Ref Range    WBC 7.0 4.6 - 13.2 K/uL    RBC 3.75 (L) 4.20 - 5.30 M/uL    HGB 11.3 (L) 12.0 - 16.0 g/dL    HCT 35.5 35.0 - 45.0 %    MCV 94.7 74.0 - 97.0 FL    MCH 30.1 24.0 - 34.0 PG    MCHC 31.8 31.0 - 37.0 g/dL    RDW 12.9 11.6 - 14.5 %    PLATELET 243 135 - 420 K/uL    MPV 10.1 9.2 - 11.8 FL    NEUTROPHILS 59 40 - 73 %    LYMPHOCYTES 32 21 - 52 %    MONOCYTES 7 3 - 10 %    EOSINOPHILS 2 0 - 5 %    BASOPHILS 0 0 - 2 %    ABS. NEUTROPHILS 4.1 1.8 - 8.0 K/UL    ABS. LYMPHOCYTES 2.3 0.9 - 3.6 K/UL    ABS. MONOCYTES 0.5 0.05 - 1.2 K/UL    ABS. EOSINOPHILS 0.1 0.0 - 0.4 K/UL    ABS. BASOPHILS 0.0 0.0 - 0.1 K/UL    DF AUTOMATED     METABOLIC PANEL, COMPREHENSIVE    Collection Time: 02/13/17  2:40 PM   Result Value Ref Range    Sodium 144 136 - 145 mmol/L    Potassium 3.7 3.5 - 5.5 mmol/L     Chloride 108 100 - 108 mmol/L    CO2 27 21 - 32 mmol/L    Anion gap 9 3.0 - 18 mmol/L    Glucose 127 (H) 74 - 99 mg/dL    BUN 10 7.0 - 18 MG/DL    Creatinine 0.81 0.6 - 1.3 MG/DL    BUN/Creatinine ratio 12 12 - 20      GFR est AA >60 >60 ml/min/1.61m    GFR est non-AA >60 >60 ml/min/1.768m   Calcium 9.0 8.5 - 10.1 MG/DL    Bilirubin, total 0.2 0.2 -  1.0 MG/DL    ALT (SGPT) 18 13 - 56 U/L    AST (SGOT) 16 15 - 37 U/L    Alk. phosphatase 101 45 - 117 U/L    Protein, total 7.2 6.4 - 8.2 g/dL    Albumin 3.5 3.4 - 5.0 g/dL    Globulin 3.7 2.0 - 4.0 g/dL    A-G Ratio 0.9 0.8 - 1.7     CARDIAC PANEL,(CK, CKMB & TROPONIN)    Collection Time: 02/13/17  2:40 PM   Result Value Ref Range    CK 161 26 - 192 U/L    CK - MB <1.0 <3.6 ng/ml    CK-MB Index  0.0 - 4.0 %     CALCULATION NOT PERFORMED WHEN RESULT IS BELOW LINEAR LIMIT    Troponin-I, Qt. <0.02 0.00 - 0.06 NG/ML   NT-PRO BNP    Collection Time: 02/13/17  2:40 PM   Result Value Ref Range    NT pro-BNP 394 0 - 900 PG/ML   EKG, 12 LEAD, INITIAL    Collection Time: 02/13/17  2:49 PM   Result Value Ref Range    Ventricular Rate 57 BPM    Atrial Rate 57 BPM    P-R Interval 146 ms    QRS Duration 80 ms    Q-T Interval 434 ms    QTC Calculation (Bezet) 422 ms    Calculated P Axis 58 degrees    Calculated R Axis 22 degrees    Calculated T Axis 21 degrees    Diagnosis       Sinus bradycardia  Possible Left atrial enlargement  Borderline ECG  No previous ECGs available         Radiologic Studies -   XR CHEST PORT     Impression:    Mildly enlarged cardiac silhouette. Lungs are clear.                Medical Decision Making   Provider Notes (Medical Decision Making):  MDM  Number of Diagnoses or Management Options  Peripheral edema:   Diagnosis management comments: Peripheral edema   CHF        I am the first provider for this patient.    I reviewed the vital signs, available nursing notes, past medical history, past surgical history, family history and social history.     Vital Signs-Reviewed the patient's vital signs.    Records Reviewed: Nursing Notes (Time of Review: 2:07 PM)    ED Course: Progress Notes, Reevaluation, and Consults:    Labs essentially normal. cardiac enzymes negative. BNP normal. Chest X-Ray showed Mildly enlarged heart silhouette. EKG showed SB with rate of 57 bpm. With no ST elevations or depression and non specific T wave changes.   2:07 PM 02/13/2017      Diagnosis       I have reassessed the patient.  Patient is feeling better.  Patient was discharged in stable condition.  Patient is to return to emergency department if any new or worsening condition.      Clinical Impression:   1. Peripheral edema        Disposition: HOME    Follow-up Information     Follow up With Details Comments Contact Info    Garrel Ridgel, MD Go in 2 days For Follow up [address]  [address]  [place] [address]  [facility] Go to As needed, If symptoms worsen [place]  [place] [address]  [phone]           _______________________________    Attestations:  Scribe Attestation     Eliezer Champagne acting as a scribe for and in the presence of Darrold Span, DO      February 13, 2017 at 2:07 PM       Provider Attestation:      I personally performed the services described in the documentation, reviewed the documentation, as recorded by the scribe in my presence, and it accurately and completely records my words and actions. February 13, 2017 at 2:07 PM - [place], DO    _______________________________

## 2017-02-13 NOTE — ED Notes (Signed)
Angela Potter is a 71 y.o. female that was discharged in good condition.  The patients diagnosis, condition and treatment were explained to  patient and aftercare instructions were given.  The patient verbalized understanding. Patient armband removed and shredded.

## 2017-02-14 LAB — EKG, 12 LEAD, INITIAL
Atrial Rate: 57 {beats}/min
Calculated P Axis: 58 degrees
Calculated R Axis: 22 degrees
Calculated T Axis: 21 degrees
P-R Interval: 146 ms
Q-T Interval: 434 ms
QRS Duration: 80 ms
QTC Calculation (Bezet): 422 ms
Ventricular Rate: 57 {beats}/min

## 2017-02-14 LAB — EKG 12-LEAD
Atrial Rate: 57 {beats}/min
P Axis: 58 degrees
P-R Interval: 146 ms
Q-T Interval: 434 ms
QRS Duration: 80 ms
QTc Calculation (Bazett): 422 ms
R Axis: 22 degrees
T Axis: 21 degrees
Ventricular Rate: 57 {beats}/min
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
## 2017-03-06 NOTE — Progress Notes (Signed)
 ASSESSMENT:   As detailed in the progress note below, the patient was assessed for:  (Z12.11) Colon cancer screening - Plan: REFERRAL TO GASTROENTEROLOGY, OCCULT BLOOD STOOL, IMMUNOASSAY (FIT)  1-dementia Seen by neurology 11/2016.  Speech referral mentioned but no referral in system.  Will place under home health as do not deem safe to drive.  Encouraged her to reach out to Visiting physicians # that I provided her, also encouraged her to investigate assisted living facilities. Note aricept  10mg  in bag 9/10 visit  2-T2DM Home health services above. Will call pt again in a week to check re: sugar log. Should be on metformin 500mg  bid januvia 100mg  daily-both in bag 9/10 visit Pt never picked up basaglar and will likely not be able to administer safely until assessment by home health accomplished.  3-HTN Lisinopril  40mg  Metoprolol  100mg  bid Aldactone 12.5mg  Hydralazine 100mg  tid These were in pt's bag 9/10 visit  4-reflux Prevacid 15mg  in medicine bag 9/10 visit  5-HPL lipitor 40mg -dose increased to 80mg  9/10 visit due to h/o stroke  6-depression cymbalta 60mg  not in bag 9/10 visit  7-vitamin D def/osteopenia Did not have 50K 9/10 visit  PLAN:   Discussed evaluation, treatment and usual course. Patient verbalizes understanding and agrees with plan of care. All questions were answered.  Smoking status/exposure - Is there current tobacco use, second hand tobacco exposure, or environmental exposure? No   The patient's medicines, past medical, family and social histories (including tobacco usage) were reviewed and updated as appropriate.   SUBJECTIVE:   Chief Complaint  Patient presents with  . DEMENTIA  . DIABETES  . HYPERTENSION   71y/o AAMF with HTN, dementia, T2DM, reflux This visit brought pill dispenser but no bottles. She does not recall being contacted by [facility]. 9/20 instructing to log blood sugars.  Has written note from me about Visiting home physicians, has  not yet contacted. Again advised that should not be driving.  She has no insight as to degree of cognitive impairment.  Spouse post stroke, can drive in a modified vehicle per pt.  Doesn't want to involve him.  Son is a Engineer, technical sales. When asked president=Trump.  Season='autumn'.  Year '2016 or 2017'.  Able to state city and state.  HPI   Review of Systems  Unable to perform ROS: Dementia     OBJECTIVE   BP 134/84 (Site: Arm L, Position: Sitting, Cuff Size: Medium)   Pulse 76   Temp 98.1 F (36.7 C) (Oral)   Resp 16   Ht 5' 3 (1.6 m)   Wt 85.6 kg (188 lb 12.8 oz)   BMI 33.44 kg/m  BMI: 33.44   Physical Exam  Constitutional: No distress.  Eyes: Conjunctivae are normal.  Cardiovascular: Normal rate, regular rhythm and intact distal pulses.  Exam reveals no gallop.   No murmur heard. Pulmonary/Chest: She has no wheezes. She has no rales.  Musculoskeletal: She exhibits no edema.  Neurological: She is alert.  Well groomed  Skin: She is not diaphoretic.  Psychiatric: Affect normal.  Nursing note and vitals reviewed.

## 2017-04-13 NOTE — Unmapped External Note (Signed)
 Follow up PCP Consultation, a home health order for home health medication management, and an order for home health social worker for a home health evaluation.   The LCSW will call the pt and follow up for emotional support.

## 2017-08-28 NOTE — Progress Notes (Signed)
 Theresa Mendez [address] [place], [place] [address] Phone: [phone] Fax: [phone]      ASSESSMENT  1. Memory concerns onset 2014.  Functionally, mild cognitive impairment.  Theresa Mendez tends to be repetitive during conversation. On [place] today, she was unable to recall 5 words, and was unsure of the day month and year. Her past neuropsychological evaluation has been suggested of an amnestic profile which is suggestive of Alzheimer's disease.  PLAN  1. Referral placed for cognitive brain training through speech therapy.  2. Theresa Mendez and interested in research and information provided. This was previously mentioned in February 2018. Will follow up with research team regarding the outcome of that visit.  3.  Theresa Mendez sister who lives out of town is requesting additional resources so that a nurse can perform medication checks as Theresa Mendez has been noncompliant with medicines. Referral placed to [facility] health.  4.  Provided sleep hygiene recommendations and suggested that she try taking melatonin 3-5 mg before bed which she has not tried.  5. Assistance with medication administration appears to be needed. A referral placed for home health through [facility].   6. Follow-up in 1 year.  25 minutes was spent face to face with > 50% time counseling and coordinating care.   Note: This was dictated using a computer transcription program. Although proofread, it may contain computer transcription errors and phonetic errors. Other human proofreading errors may also exist. Corrections may be performed at a later time. Please contact us  for any clarification if needed.  PERTINENT HISTORY  Pertinent History Theresa Mendez was first evaluated by Dr. Gust 02/14/13. Cognitive problems were first evident at work early 2014. She was initially seen by Dr. Rubin in March 2014  and had neuropsychological testing by Dr. Katrinka May 2014 notable for an amnestic pattern with poor memory storage, most suggestive of Alzheimer's pathology. She described feeling like cloudy thinking was made clearer with donepezil  10 mg.  She lives with her husband who had a stroke in 2008 which affected his left side. She does household chores, cooking, manages the finances with a couple being late only a few days. She uses a chart to keep them organized. She drives without getting lost.   Her right foot spontaneously inverts at times. She attributed this to lumbar spinal stenosis  Theresa Mendez was evaluated by Dr. Deette on 09/02/14 with impression of mild level dementia.  In February 2018, she described feeling depressed due to being unable to connect in social networks.  She has plans to resume going to the [facility] as well as re-joining her book club. She wants to start going back to a book club and going back to the [facility]. Her sister works with a AD association in [place]  and asked about any potential research studies with whom she met with on 08/04/16.   Sleep:Over time, she is described sleeping between 4-5 hours per night, going to bed at 12 or 1 AM arising at 6 or 7 AM. Despite the little sleep, she spontaneously arises. She thinks she might be able to fall asleep if she goes to bed earlier but she doesn't feel sleepy.   In June 2018, she endorsed having difficulty with recall as well as doing information. Following passages she states that she would lose track of details.  She expressed not being socially active due to being afraid that her friends may noticed a change in her memory. Regarding  activities of daily living, she manages her medications independently, she continues to drive, cook, and dress herself.  Interval History 08/28/17: Theresa Mendez is accompanied by her husband in sister for this visit.  She endorses that her memory has improved a little.  She just stopped driving 6  months ago due to having multiple movements with her legs.  Mr. Brien states he has not noticed a change in her memory however her sister feels that she has been repetitive.  Other symptoms include feeling lightheaded for the past 3 months typically when she changes position.  Her sister expresses concern regarding managing medications.  During our visit, she repeated a previous conversation regarding being socially active in a book club as well as mentioning a friend who also have cognitive difficulties and the fact that she is written a book. PERTINENT DATA   IMAGING  MRI head 09/03/12 showed significant frontal, parietal and temporal atrophy. No significant vascular changes.  COGNITIVE DATA  (2015) MoCA 16/30 - 0/5 free recall but got 1 with a cue and 3 with multiple choice. Qualitatively significantly worse copy of a cube and clock numbering compared to March 2014, 19/30 (08/04/16), 19/30 (11/14/2016), 15/30 (08/28/17)  LABS  Blood Lab Results  Component Value Date   NA 144 01/11/2017   GLUCOSE 202 (H) 01/11/2017   CALCIUM 9.1 01/11/2017   CREAT 0.7 (L) 01/11/2017   SGPTALT 13 01/11/2017   SGOTAST 13 01/11/2017   TSH 1.88 01/11/2017   WBC 5.4 01/11/2017   HCT 37.2 01/11/2017   HGBA1C 10.0 (H) 01/11/2017   CHOLESTEROL 142 01/11/2017   LDLIPO 82 01/11/2017   METHMALACD 68 09/14/2012   SEDRATE 12.0 01/23/2014     PAST HISTORY   Past Medical History:  Diagnosis Date  . Back pain   . Colon polyp   . Diabetes mellitus (HCC)   . Esophageal reflux   . Hemorrhoid   . Hypercholesteremia   . Hypertension   . Knee pain   . Osteopenia   . Piercing   . Unspecified cerebral artery occlusion with cerebral infarction      Past Surgical History:  Procedure Laterality Date  . COLON RESECTION  '00  . HERNIA REPAIR  2002  . LYSIS OF ADHESIONS  '01  . 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 PARTIAL HYSTERECTOMY  1989   for uterine fibroids     Allergies  Allergen  Reactions  . Zocor [Simvastatin] muscle/joint pain    Family History  Problem Relation Age of Onset  . Alcohol abuse Father   . Cancer, Prostate Father   . Hypertension Father   . Asthma Mother   . Hypertension Mother   . High Cholesterol Mother   . Stroke Mother   . Diabetes Sister   . Hypertension Sister   . High Cholesterol Sister   . Alcohol abuse Brother   . Cancer, Prostate Brother   . Hypertension Brother   . High Cholesterol Brother   . Diabetes Maternal Aunt   . Cancer Maternal Aunt        Lung Cancer- Mom's half sister Share Father  . Diabetes Paternal Aunt   . Diabetes Maternal Uncle   . Diabetes Paternal Uncle   . Heart Disease Paternal Grandmother   . Alcohol abuse Maternal Grandfather   . Alcohol abuse Paternal Grandfather      Social History   Socioeconomic History  . Marital status: Married    Spouse name: Not on  file  . Number of children: Not on file  . Years of education: Not on file  . Highest education level: Not on file  Occupational History  . Not on file  Social Needs  . Financial resource strain: Not on file  . Food insecurity:    Worry: Not on file    Inability: Not on file  . Transportation needs:    Medical: Not on file    Non-medical: Not on file  Tobacco Use  . Smoking status: Former Games developer  . Smokeless tobacco: Never Used  . Tobacco comment: quit 1990's  Substance and Sexual Activity  . Alcohol use: No  . Drug use: No  . Sexual activity: Yes    Partners: Male  Lifestyle  . Physical activity:    Days per week: Not on file    Minutes per session: Not on file  . Stress: Not on file  Relationships  . Social connections:    Talks on phone: Not on file    Gets together: Not on file    Attends religious service: Not on file    Active member of club or organization: Not on file    Attends meetings of clubs or organizations: Not on file    Relationship status: Not on file  . Intimate partner violence:    Fear of current or  ex partner: Not on file    Emotionally abused: Not on file    Physically abused: Not on file    Forced sexual activity: Not on file  Other Topics Concern  . Back Care Not Asked  . Bike Helmet Not Asked  . Blood Transfusions Not Asked  . Caffeine Concern No  . Exercise No  . Hobby Hazards Not Asked  . Military Service Not Asked  . Occupational Exposure Not Asked  . Seat Belt Yes  . Self-Exams No  . Sleep Concern Not Asked  . Special Diet Not Asked  . Stress Concern Not Asked  . Weight Concern Not Asked  . Back Care Not Asked  . Counseling Not Asked  . Depression Concerns Not Asked  . Depression Screening Not Asked  . Smoke Detectors Not Asked  . Smoking Concerns Not Asked  . Smoking Cessation Not Asked  Social History Narrative  . Not on file      MEDICATIONS   No outpatient medications have been marked as taking for the 08/28/17 encounter (Appointment) with Debby Ella NOVAK, NP.     ROS  Positives in [place] (otherwise negative)  Neurological: memory loss, poor concentration, headache, double vision, slurred speech, imbalance/unsteady gait, falls, restless feeling in legs, abnormal movements, tremor, numbness/tingling, weakness, cramps/spasms, loss of sense of smell General: excessive fatigue, fever, chills, night sweats, excessive daytime sweating, weight gain, weight loss Sleep: trouble sleeping, loud snoring, stop breathing during sleep, vivid dreams, abnormal movements during sleep Eyes/Ears/Mouth: visual loss, blurred vision, excessively dry eyes, eye pain, hearing loss, ringing in ears, unexplained dry mouth Throat/sinus: nasal congestion, sinus pain, swallowing problems Neck: neck stiffness, swollen lymph nodes Pulmonary: shortness of breath, excessive coughing Cardiac: chest pain, heart racing, passing out, lightheaded with standing Vascular: swollen legs, easy bruising or bleeding, recent transfusions GI: constiptation, diarrhea, incontinence (bowel  accidents) Genitourinary: frequent urination, incontinence (bladder accidents), too little warning before urination, incomplete bladder emptying, trouble getting an erection, vaginal dryness Musculoskeletal: joint pain, muscle aches Psychiatric: depression, anxiety, high levels of stress, hallucinations, compulsive behaviors, laughing or crying for no reason  EXAM   There were  no vitals filed for this visit. There is no height or weight on file to calculate BMI.  Appeared well, no distress.  No edema  Neurologically the patient was alert and cooperative.  Language was fluent, with normal comprehension and grammar.  Discourse was organized. Fund of knowledge was normal.  Affect was euthymic.  She was oriented in detail and attentive to the conversation. She was repetitive during conversation and at the end of the visit, asked me the reason for the referral to speech therapy after this was previously discussed. Concern raised by PCP regarding medication administration.  Muscle strength:   Right  Left   deltoid  5/5  5/5   biceps  5/5  5/5   triceps  5/5  5/5     Right  Left   Hip flexion  5/5  5/5   Knee flexion 5/5 5/5  Knee extension  5/5  5/5   Ankle dorsiflexion  5/5  5/5   Plantar flexion  5/5  5/5     Deep tendon reflexes at biceps, BR, patella and ankles were normal and symmetric. Plantar responses were flexor.   Ella Ned, NP-C  This patient was seen and examined under the general supervision of Dr. Gust, who did not visit with this patient today.
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
## 2017-10-31 NOTE — Telephone Encounter (Signed)
 12/10/2013 advance care plan states sister, Princella Garre [employer], as Power of attorney.  Please let sister letter has been written.  Please ascertain whether wants letter sent to her or wishes to pick up?

## 2017-11-20 ENCOUNTER — Inpatient Hospital Stay
Admit: 2017-11-20 | Discharge: 2017-11-20 | Disposition: A | Discharge status: Home / Self Care | Payer: MEDICARE | Attending: Emergency Medicine

## 2017-11-20 DIAGNOSIS — R42 Dizziness and giddiness: Secondary | ICD-10-CM

## 2017-11-20 LAB — METABOLIC PANEL, COMPREHENSIVE
A-G Ratio: 1 (ref 0.8–1.7)
ALT (SGPT): 29 U/L (ref 13–56)
AST (SGOT): 28 U/L (ref 15–37)
Albumin: 3.6 g/dL (ref 3.4–5.0)
Alk. phosphatase: 149 U/L — ABNORMAL HIGH (ref 45–117)
Anion gap: 6 mmol/L (ref 3.0–18)
BUN/Creatinine ratio: 25 — ABNORMAL HIGH (ref 12–20)
BUN: 16 MG/DL (ref 7.0–18)
Bilirubin, total: 0.2 MG/DL (ref 0.2–1.0)
CO2: 30 mmol/L (ref 21–32)
Calcium: 9.3 MG/DL (ref 8.5–10.1)
Chloride: 107 mmol/L (ref 100–108)
Creatinine: 0.63 MG/DL (ref 0.6–1.3)
GFR est AA: >60 mL/min/{1.73_m2} (ref >60)
GFR est non-AA: >60 mL/min/{1.73_m2} (ref >60)
Globulin: 3.6 g/dL (ref 2.0–4.0)
Glucose: 191 mg/dL — ABNORMAL HIGH (ref 74–99)
Potassium: 4.3 mmol/L (ref 3.5–5.5)
Protein, total: 7.2 g/dL (ref 6.4–8.2)
Sodium: 143 mmol/L (ref 136–145)

## 2017-11-20 LAB — EKG, 12 LEAD, INITIAL
Atrial Rate: 80 {beats}/min
Calculated P Axis: 58 degrees
Calculated R Axis: 38 degrees
Calculated T Axis: -9 degrees
Diagnosis: NORMAL
P-R Interval: 132 ms
Q-T Interval: 370 ms
QRS Duration: 72 ms
QTC Calculation (Bezet): 426 ms
Ventricular Rate: 80 {beats}/min

## 2017-11-20 LAB — CBC WITH AUTOMATED DIFF
ABS. BASOPHILS: 0.1 10*3/uL (ref 0.0–0.1)
ABS. EOSINOPHILS: 0.2 10*3/uL (ref 0.0–0.4)
ABS. LYMPHOCYTES: 2.6 10*3/uL (ref 0.9–3.6)
ABS. MONOCYTES: 0.5 10*3/uL (ref 0.05–1.2)
ABS. NEUTROPHILS: 4.1 10*3/uL (ref 1.8–8.0)
BASOPHILS: 1 % (ref 0–2)
EOSINOPHILS: 2 % (ref 0–5)
HCT: 34.8 % — ABNORMAL LOW (ref 35.0–45.0)
HGB: 10.9 g/dL — ABNORMAL LOW (ref 12.0–16.0)
LYMPHOCYTES: 35 % (ref 21–52)
MCH: 29 PG (ref 24.0–34.0)
MCHC: 31.3 g/dL (ref 31.0–37.0)
MCV: 92.6 FL (ref 74.0–97.0)
MONOCYTES: 7 % (ref 3–10)
MPV: 10.1 FL (ref 9.2–11.8)
NEUTROPHILS: 55 % (ref 40–73)
PLATELET: 312 10*3/uL (ref 135–420)
RBC: 3.76 M/uL — ABNORMAL LOW (ref 4.20–5.30)
RDW: 13.3 % (ref 11.6–14.5)
WBC: 7.5 10*3/uL (ref 4.6–13.2)

## 2017-11-20 LAB — TROPONIN I: Troponin-I, QT: <0.02 NG/ML (ref 0.0–0.045)

## 2017-11-20 LAB — CBC WITH AUTO DIFFERENTIAL
Basophils %: 1 % (ref 0–2)
Basophils Absolute: 0.1 10*3/uL (ref 0.0–0.1)
Eosinophils %: 2 % (ref 0–5)
Eosinophils Absolute: 0.2 10*3/uL (ref 0.0–0.4)
Hematocrit: 34.8 % — ABNORMAL LOW (ref 35.0–45.0)
Hemoglobin: 10.9 g/dL — ABNORMAL LOW (ref 12.0–16.0)
Lymphocytes %: 35 % (ref 21–52)
Lymphocytes Absolute: 2.6 10*3/uL (ref 0.9–3.6)
MCH: 29 PG (ref 24.0–34.0)
MCHC: 31.3 g/dL (ref 31.0–37.0)
MCV: 92.6 FL (ref 74.0–97.0)
MPV: 10.1 FL (ref 9.2–11.8)
Monocytes %: 7 % (ref 3–10)
Monocytes Absolute: 0.5 10*3/uL (ref 0.05–1.2)
Neutrophils %: 55 % (ref 40–73)
Neutrophils Absolute: 4.1 10*3/uL (ref 1.8–8.0)
Platelets: 312 10*3/uL (ref 135–420)
RBC: 3.76 M/uL — ABNORMAL LOW (ref 4.20–5.30)
RDW: 13.3 % (ref 11.6–14.5)
WBC: 7.5 10*3/uL (ref 4.6–13.2)

## 2017-11-20 LAB — COMPREHENSIVE METABOLIC PANEL
ALT: 29 U/L (ref 13–56)
AST: 28 U/L (ref 15–37)
Albumin/Globulin Ratio: 1 (ref 0.8–1.7)
Albumin: 3.6 g/dL (ref 3.4–5.0)
Alkaline Phosphatase: 149 U/L — ABNORMAL HIGH (ref 45–117)
Anion Gap: 6 mmol/L (ref 3.0–18)
BUN: 16 MG/DL (ref 7.0–18)
Bun/Cre Ratio: 25 — ABNORMAL HIGH (ref 12–20)
CO2: 30 mmol/L (ref 21–32)
Calcium: 9.3 MG/DL (ref 8.5–10.1)
Chloride: 107 mmol/L (ref 100–108)
Creatinine: 0.63 MG/DL (ref 0.6–1.3)
EGFR IF NonAfrican American: >60 mL/min/{1.73_m2} (ref >60)
GFR African American: >60 mL/min/{1.73_m2} (ref >60)
Globulin: 3.6 g/dL (ref 2.0–4.0)
Glucose: 191 mg/dL — ABNORMAL HIGH (ref 74–99)
Potassium: 4.3 mmol/L (ref 3.5–5.5)
Sodium: 143 mmol/L (ref 136–145)
Total Bilirubin: 0.2 MG/DL (ref 0.2–1.0)
Total Protein: 7.2 g/dL (ref 6.4–8.2)

## 2017-11-20 LAB — EKG 12-LEAD
Atrial Rate: 80 {beats}/min
Diagnosis: NORMAL
P Axis: 58 degrees
P-R Interval: 132 ms
Q-T Interval: 370 ms
QRS Duration: 72 ms
QTc Calculation (Bazett): 426 ms
R Axis: 38 degrees
T Axis: -9 degrees
Ventricular Rate: 80 {beats}/min

## 2017-11-20 LAB — TROPONIN: Troponin I: <0.02 NG/ML (ref 0.0–0.045)

## 2017-11-20 MED ORDER — ACETAMINOPHEN 500 MG TAB
500 mg | ORAL | Status: AC
Start: 2017-11-20 — End: 2017-11-20
  Administered 2017-11-20: 21:00:00 via ORAL

## 2017-11-20 MED FILL — ACETAMINOPHEN 500 MG TAB: 500 mg | ORAL | Qty: 2

## 2017-11-20 NOTE — ED Provider Notes (Signed)
Pleasant 72 year old female presents for evaluation of onset of symptoms was about 4 days ago.  She specifically denies vertigo.  She has no feelings of presyncope.  She denies chest pain, palpitations, or shortness of breath.  She has not otherwise been ill.  Review of systems is negative for nasal congestion, drainage, sinus pain, cough, nausea, vomiting, diarrhea, or acute abdominal pain.  Patient notes vague lightheaded sensation occurring intermittently throughout the day that is not positional in nature or associated with a sensation of spinning or fainting.           Past Medical History:   Diagnosis Date   ??? Alzheimer's dementia    ??? Arthritis    ??? Back pain    ??? Diabetes ([place])    ??? GERD (gastroesophageal reflux disease)    ??? High cholesterol    ??? Hypertension    ??? Lumbar stenosis    ??? Osteopenia        Past Surgical History:   Procedure Laterality Date   ??? ABDOMEN SURGERY PROC UNLISTED      Scar tissue removed from abdomen   ??? HX COLECTOMY     ??? HX HERNIA REPAIR     ??? HX HYSTERECTOMY     ??? HX OTHER SURGICAL      Colon polyp removal         Family History:   Problem Relation Age of Onset   ??? Alzheimer Mother    ??? Stroke Mother    ??? Hypertension Mother    ??? Diabetes Mother    ??? Elevated Lipids Mother    ??? Hypertension Father    ??? Cancer Father         Prostate   ??? Other Father         Back problems   ??? Diabetes Sister    ??? Hypertension Sister    ??? Hypertension Brother    ??? Cancer Brother         Prostate x 4 brothers       Social History     Socioeconomic History   ??? Marital status: MARRIED     Spouse name: Not on file   ??? Number of children: Not on file   ??? Years of education: Not on file   ??? Highest education level: Not on file   Occupational History   ??? Not on file   Social Needs   ??? Financial resource strain: Not on file   ??? Food insecurity:     Worry: Not on file     Inability: Not on file   ??? Transportation needs:     Medical: Not on file     Non-medical: Not on file   Tobacco Use   ??? Smoking status:  Former Smoker     Types: Cigarettes     Last attempt to quit: 05/12/1984     Years since quitting: 33.5   ??? Smokeless tobacco: Never Used   Substance and Sexual Activity   ??? Alcohol use: No   ??? Drug use: No   ??? Sexual activity: Not on file   Lifestyle   ??? Physical activity:     Days per week: Not on file     Minutes per session: Not on file   ??? Stress: Not on file   Relationships   ??? Social connections:     Talks on phone: Not on file     Gets together: Not on file  Attends religious service: Not on file     Active member of club or organization: Not on file     Attends meetings of clubs or organizations: Not on file     Relationship status: Not on file   ??? Intimate partner violence:     Fear of current or ex partner: Not on file     Emotionally abused: Not on file     Physically abused: Not on file     Forced sexual activity: Not on file   Other Topics Concern   ??? Not on file   Social History Narrative   ??? Not on file         ALLERGIES: Zocor [simvastatin]    Review of Systems   Constitutional: Negative for fatigue and fever.   Respiratory: Negative for shortness of breath.    Cardiovascular: Negative for chest pain.   Gastrointestinal: Negative for abdominal pain, diarrhea and vomiting.   Neurological: Positive for light-headedness. Negative for speech difficulty, weakness and numbness.   All other systems reviewed and are negative.      Vitals:    11/20/17 1354 11/20/17 1359   BP:  (!) 195/95   Pulse:  88   Resp:  17   Temp:  98.7 ??F (37.1 ??C)   SpO2:  98%   Weight: 79.4 kg (175 lb)    Height: _0  (1.6 m)             Physical Exam   Constitutional: She is oriented to person, place, and time. She appears well-developed and well-nourished. No distress.   HENT:   Head: Normocephalic and atraumatic.   Nose: Nose normal.   Eyes: No scleral icterus.   Neck: Neck supple.   Cardiovascular: Normal rate, regular rhythm and normal heart sounds.   No murmur heard.  Pulmonary/Chest: Effort normal and breath sounds  normal. No respiratory distress. She has no wheezes. She has no rales.   Abdominal: Soft. There is no tenderness. There is no rebound and no guarding.   Musculoskeletal: She exhibits no edema or tenderness.   Neurological: She is alert and oriented to person, place, and time. She exhibits normal muscle tone. Coordination normal.   Skin: Skin is warm and dry. No rash noted. She is not diaphoretic.   Psychiatric: She has a normal mood and affect.   Nursing note and vitals reviewed.     EKG per my interpretation reveals a normal sinus rhythm 80.  There are mild nonspecific lateral ST-T wave changes without acute ST segment elevations.  There are Q waves in lead V1 and aVR.    Recent Results (from the past 12 hour(s))   EKG, 12 LEAD, INITIAL    Collection Time: 11/20/17  1:54 PM   Result Value Ref Range    Ventricular Rate 80 BPM    Atrial Rate 80 BPM    P-R Interval 132 ms    QRS Duration 72 ms    Q-T Interval 370 ms    QTC Calculation (Bezet) 426 ms    Calculated P Axis 58 degrees    Calculated R Axis 38 degrees    Calculated T Axis -9 degrees    Diagnosis       Normal sinus rhythm  Possible Left atrial enlargement  Anterior infarct , age undetermined  Abnormal ECG  When compared with ECG of 13-Feb-2017 14:49,  Nonspecific T wave abnormality, worse in Anterolateral leads  Confirmed by Berna Spare MD, --- ([id number]) on 11/20/2017 2:11:17  PM     CBC WITH AUTOMATED DIFF    Collection Time: 11/20/17  3:48 PM   Result Value Ref Range    WBC 7.5 4.6 - 13.2 K/uL    RBC 3.76 (L) 4.20 - 5.30 M/uL    HGB 10.9 (L) 12.0 - 16.0 g/dL    HCT 34.8 (L) 35.0 - 45.0 %    MCV 92.6 74.0 - 97.0 FL    MCH 29.0 24.0 - 34.0 PG    MCHC 31.3 31.0 - 37.0 g/dL    RDW 13.3 11.6 - 14.5 %    PLATELET 312 135 - 420 K/uL    MPV 10.1 9.2 - 11.8 FL    NEUTROPHILS 55 40 - 73 %    LYMPHOCYTES 35 21 - 52 %    MONOCYTES 7 3 - 10 %    EOSINOPHILS 2 0 - 5 %    BASOPHILS 1 0 - 2 %    ABS. NEUTROPHILS 4.1 1.8 - 8.0 K/UL    ABS. LYMPHOCYTES 2.6 0.9 - 3.6 K/UL    ABS.  MONOCYTES 0.5 0.05 - 1.2 K/UL    ABS. EOSINOPHILS 0.2 0.0 - 0.4 K/UL    ABS. BASOPHILS 0.1 0.0 - 0.1 K/UL    DF AUTOMATED     METABOLIC PANEL, COMPREHENSIVE    Collection Time: 11/20/17  3:48 PM   Result Value Ref Range    Sodium 143 136 - 145 mmol/L    Potassium 4.3 3.5 - 5.5 mmol/L    Chloride 107 100 - 108 mmol/L    CO2 30 21 - 32 mmol/L    Anion gap 6 3.0 - 18 mmol/L    Glucose 191 (H) 74 - 99 mg/dL    BUN 16 7.0 - 18 MG/DL    Creatinine 0.63 0.6 - 1.3 MG/DL    BUN/Creatinine ratio 25 (H) 12 - 20      GFR est AA >60 >60 ml/min/1.31m    GFR est non-AA >60 >60 ml/min/1.739m   Calcium 9.3 8.5 - 10.1 MG/DL    Bilirubin, total 0.2 0.2 - 1.0 MG/DL    ALT (SGPT) 29 13 - 56 U/L    AST (SGOT) 28 15 - 37 U/L    Alk. phosphatase 149 (H) 45 - 117 U/L    Protein, total 7.2 6.4 - 8.2 g/dL    Albumin 3.6 3.4 - 5.0 g/dL    Globulin 3.6 2.0 - 4.0 g/dL    A-G Ratio 1.0 0.8 - 1.7     TROPONIN I    Collection Time: 11/20/17  3:48 PM   Result Value Ref Range    Troponin-I, QT <0.02 0.0 - 0.045 NG/ML       MDM  Number of Diagnoses or Management Options  Essential hypertension:   Lightheadedness:   Diagnosis management comments: Impression: 89ear old female history of hypertension presents with lightheadedness.  Blood pressure remains in the 175/95 range.  Medications have been reviewed and she is currently on 5 separate blood pressure medicines.  Suspect that blood pressures incidental to the complaint of lightheadedness.  Screening labs and EKG have been reviewed.  She does not have any lateralizing symptoms to suggest any acute CNS process.         Procedures    Diagnosis:   1. Lightheadedness    2. Essential hypertension      1.  Screening labs today including heart muscle enzyme test (troponin) were normal.  2.  No acute abnormalities were  noted on the EKG.  3.  Precise cause for lightheaded sensation is undetermined.  4.  Recheck with your primary care physician in 5 to 7 days if symptoms persist.  5.  Return to the  emergency department for fainting, chest pain, irregular heart rate with palpitations, acute shortness of breath, high fever, or other acute medical emergencies.  6.  Continue current blood pressure medications.  Monitor your blood pressure at home several times a day and take the results to your primary care physician for follow-up in 7 to 10 days.  7.  Return to the emergency department for elevated blood pressure with associated symptoms of chest pain, shortness of breath, or severe headache.    Disposition: home    Follow-up Information     Follow up With Specialties Details Why Contact Info    Fitzharris, Remo Lipps, MD Internal Medicine In 1 week for recheck of ongoing symptoms, for repeat blood pressure check [address]  [address]  [place] [address]  [phone]            Patient's Medications   Start Taking    No medications on file   Continue Taking    ATORVASTATIN (LIPITOR) 10 MG TABLET    Take 10 mg by mouth daily.    CELECOXIB (CELEBREX) 200 MG CAPSULE    1 po daily as needed; take with food    CLOPIDOGREL (PLAVIX) 75 MG TABLET    Take  by mouth daily.    DONEPEZIL (ARICEPT) 10 MG TABLET    Take 10 mg by mouth nightly.    ERGOCALCIFEROL (ERGOCALCIFEROL) 50,000 UNIT CAPSULE    Take 50,000 Units by mouth every seven (7) days.    HYDRALAZINE (APRESOLINE) 100 MG TABLET    Take 100 mg by mouth three (3) times daily.    LANSOPRAZOLE (PREVACID) 30 MG CAPSULE    Take  by mouth Daily (before breakfast).    LISINOPRIL (PRINIVIL, ZESTRIL) 40 MG TABLET    Take 40 mg by mouth daily.    METFORMIN (GLUCOPHAGE) 500 MG TABLET    Take  by mouth two (2) times daily (with meals).    METOPROLOL (LOPRESSOR) 100 MG TABLET    Take  by mouth two (2) times a day.    SITAGLIPTIN PHOSPHATE (JANUVIA PO)    Take  by mouth.    SPIRONOLACTONE (ALDACTONE PO)    Take 25 mg by mouth daily. Takes 1/2 tab daily   These Medications have changed    No medications on file   Stop Taking    No medications on file

## 2017-11-20 NOTE — ED Notes (Signed)
Angela Potter is a 71 y.o. female that was discharged in good condition.  The patients diagnosis, condition and treatment were explained to  patient and aftercare instructions were given.  The patient verbalized understanding. Patient armband removed and shredded.

## 2017-11-20 NOTE — ED Notes (Signed)
Pt reports lightheadedness x 3 days.  Pt denies any chest pain.  Pt in NAD and ambulated with a cane.

## 2017-11-20 NOTE — ED Provider Notes (Signed)
Pleasant 72 year old female presents for evaluation of onset of symptoms was about 4 days ago.  She specifically denies vertigo.  She has no feelings of presyncope.  She denies chest pain, palpitations, or shortness of breath.  She has not otherwise been ill.  Review of systems is negative for nasal congestion, drainage, sinus pain, cough, nausea, vomiting, diarrhea, or acute abdominal pain.  Patient notes vague lightheaded sensation occurring intermittently throughout the day that is not positional in nature or associated with a sensation of spinning or fainting.           Past Medical History:   Diagnosis Date   ??? Alzheimer's dementia    ??? Arthritis    ??? Back pain    ??? Diabetes ([place])    ??? GERD (gastroesophageal reflux disease)    ??? High cholesterol    ??? Hypertension    ??? Lumbar stenosis    ??? Osteopenia        Past Surgical History:   Procedure Laterality Date   ??? ABDOMEN SURGERY PROC UNLISTED      Scar tissue removed from abdomen   ??? HX COLECTOMY     ??? HX HERNIA REPAIR     ??? HX HYSTERECTOMY     ??? HX OTHER SURGICAL      Colon polyp removal         Family History:   Problem Relation Age of Onset   ??? Alzheimer Mother    ??? Stroke Mother    ??? Hypertension Mother    ??? Diabetes Mother    ??? Elevated Lipids Mother    ??? Hypertension Father    ??? Cancer Father         Prostate   ??? Other Father         Back problems   ??? Diabetes Sister    ??? Hypertension Sister    ??? Hypertension Brother    ??? Cancer Brother         Prostate x 4 brothers       Social History     Socioeconomic History   ??? Marital status: MARRIED     Spouse name: Not on file   ??? Number of children: Not on file   ??? Years of education: Not on file   ??? Highest education level: Not on file   Occupational History   ??? Not on file   Social Needs   ??? Financial resource strain: Not on file   ??? Food insecurity:     Worry: Not on file     Inability: Not on file   ??? Transportation needs:     Medical: Not on file     Non-medical: Not on file   Tobacco Use    ??? Smoking status: Former Smoker     Types: Cigarettes     Last attempt to quit: 05/12/1984     Years since quitting: 33.5   ??? Smokeless tobacco: Never Used   Substance and Sexual Activity   ??? Alcohol use: No   ??? Drug use: No   ??? Sexual activity: Not on file   Lifestyle   ??? Physical activity:     Days per week: Not on file     Minutes per session: Not on file   ??? Stress: Not on file   Relationships   ??? Social connections:     Talks on phone: Not on file     Gets together: Not on file  Attends religious service: Not on file     Active member of club or organization: Not on file     Attends meetings of clubs or organizations: Not on file     Relationship status: Not on file   ??? Intimate partner violence:     Fear of current or ex partner: Not on file     Emotionally abused: Not on file     Physically abused: Not on file     Forced sexual activity: Not on file   Other Topics Concern   ??? Not on file   Social History Narrative   ??? Not on file         ALLERGIES: Zocor [simvastatin]    Review of Systems   Constitutional: Negative for fatigue and fever.   Respiratory: Negative for shortness of breath.    Cardiovascular: Negative for chest pain.   Gastrointestinal: Negative for abdominal pain, diarrhea and vomiting.   Neurological: Positive for light-headedness. Negative for speech difficulty, weakness and numbness.   All other systems reviewed and are negative.      Vitals:    11/20/17 1354 11/20/17 1359   BP:  (!) 195/95   Pulse:  88   Resp:  17   Temp:  98.7 ??F (37.1 ??C)   SpO2:  98%   Weight: 79.4 kg (175 lb)    Height: 5' 3"  (1.6 m)             Physical Exam   Constitutional: She is oriented to person, place, and time. She appears well-developed and well-nourished. No distress.   HENT:   Head: Normocephalic and atraumatic.   Nose: Nose normal.   Eyes: No scleral icterus.   Neck: Neck supple.   Cardiovascular: Normal rate, regular rhythm and normal heart sounds.   No murmur heard.   Pulmonary/Chest: Effort normal and breath sounds normal. No respiratory distress. She has no wheezes. She has no rales.   Abdominal: Soft. There is no tenderness. There is no rebound and no guarding.   Musculoskeletal: She exhibits no edema or tenderness.   Neurological: She is alert and oriented to person, place, and time. She exhibits normal muscle tone. Coordination normal.   Skin: Skin is warm and dry. No rash noted. She is not diaphoretic.   Psychiatric: She has a normal mood and affect.   Nursing note and vitals reviewed.     EKG per my interpretation reveals a normal sinus rhythm 80.  There are mild nonspecific lateral ST-T wave changes without acute ST segment elevations.  There are Q waves in lead V1 and aVR.    Recent Results (from the past 12 hour(s))   EKG, 12 LEAD, INITIAL    Collection Time: 11/20/17  1:54 PM   Result Value Ref Range    Ventricular Rate 80 BPM    Atrial Rate 80 BPM    P-R Interval 132 ms    QRS Duration 72 ms    Q-T Interval 370 ms    QTC Calculation (Bezet) 426 ms    Calculated P Axis 58 degrees    Calculated R Axis 38 degrees    Calculated T Axis -9 degrees    Diagnosis       Normal sinus rhythm  Possible Left atrial enlargement  Anterior infarct , age undetermined  Abnormal ECG  When compared with ECG of 13-Feb-2017 14:49,  Nonspecific T wave abnormality, worse in Anterolateral leads  Confirmed by Berna Spare MD, --- ([id number]) on 11/20/2017 2:11:17  PM     CBC WITH AUTOMATED DIFF    Collection Time: 11/20/17  3:48 PM   Result Value Ref Range    WBC 7.5 4.6 - 13.2 K/uL    RBC 3.76 (L) 4.20 - 5.30 M/uL    HGB 10.9 (L) 12.0 - 16.0 g/dL    HCT 34.8 (L) 35.0 - 45.0 %    MCV 92.6 74.0 - 97.0 FL    MCH 29.0 24.0 - 34.0 PG    MCHC 31.3 31.0 - 37.0 g/dL    RDW 13.3 11.6 - 14.5 %    PLATELET 312 135 - 420 K/uL    MPV 10.1 9.2 - 11.8 FL    NEUTROPHILS 55 40 - 73 %    LYMPHOCYTES 35 21 - 52 %    MONOCYTES 7 3 - 10 %    EOSINOPHILS 2 0 - 5 %    BASOPHILS 1 0 - 2 %     ABS. NEUTROPHILS 4.1 1.8 - 8.0 K/UL    ABS. LYMPHOCYTES 2.6 0.9 - 3.6 K/UL    ABS. MONOCYTES 0.5 0.05 - 1.2 K/UL    ABS. EOSINOPHILS 0.2 0.0 - 0.4 K/UL    ABS. BASOPHILS 0.1 0.0 - 0.1 K/UL    DF AUTOMATED     METABOLIC PANEL, COMPREHENSIVE    Collection Time: 11/20/17  3:48 PM   Result Value Ref Range    Sodium 143 136 - 145 mmol/L    Potassium 4.3 3.5 - 5.5 mmol/L    Chloride 107 100 - 108 mmol/L    CO2 30 21 - 32 mmol/L    Anion gap 6 3.0 - 18 mmol/L    Glucose 191 (H) 74 - 99 mg/dL    BUN 16 7.0 - 18 MG/DL    Creatinine 0.63 0.6 - 1.3 MG/DL    BUN/Creatinine ratio 25 (H) 12 - 20      GFR est AA >60 >60 ml/min/1.88m    GFR est non-AA >60 >60 ml/min/1.727m   Calcium 9.3 8.5 - 10.1 MG/DL    Bilirubin, total 0.2 0.2 - 1.0 MG/DL    ALT (SGPT) 29 13 - 56 U/L    AST (SGOT) 28 15 - 37 U/L    Alk. phosphatase 149 (H) 45 - 117 U/L    Protein, total 7.2 6.4 - 8.2 g/dL    Albumin 3.6 3.4 - 5.0 g/dL    Globulin 3.6 2.0 - 4.0 g/dL    A-G Ratio 1.0 0.8 - 1.7     TROPONIN I    Collection Time: 11/20/17  3:48 PM   Result Value Ref Range    Troponin-I, QT <0.02 0.0 - 0.045 NG/ML       MDM  Number of Diagnoses or Management Options  Essential hypertension:   Lightheadedness:   Diagnosis management comments: Impression: [age]ear old female history of hypertension presents with lightheadedness.  Blood pressure remains in the 175/95 range.  Medications have been reviewed and she is currently on 5 separate blood pressure medicines.  Suspect that blood pressures incidental to the complaint of lightheadedness.  Screening labs and EKG have been reviewed.  She does not have any lateralizing symptoms to suggest any acute CNS process.         Procedures    Diagnosis:   1. Lightheadedness    2. Essential hypertension      1.  Screening labs today including heart muscle enzyme test (troponin) were normal.  2.  No acute abnormalities were  noted on the EKG.  3.  Precise cause for lightheaded sensation is undetermined.   4.  Recheck with your primary care physician in 5 to 7 days if symptoms persist.  5.  Return to the emergency department for fainting, chest pain, irregular heart rate with palpitations, acute shortness of breath, high fever, or other acute medical emergencies.  6.  Continue current blood pressure medications.  Monitor your blood pressure at home several times a day and take the results to your primary care physician for follow-up in 7 to 10 days.  7.  Return to the emergency department for elevated blood pressure with associated symptoms of chest pain, shortness of breath, or severe headache.    Disposition: home    Follow-up Information     Follow up With Specialties Details Why Contact Info    Fitzharris, Remo Lipps, MD Internal Medicine In 1 week for recheck of ongoing symptoms, for repeat blood pressure check [address]  [address]  [place] [address]  [phone]            Patient's Medications   Start Taking    No medications on file   Continue Taking    ATORVASTATIN (LIPITOR) 10 MG TABLET    Take 10 mg by mouth daily.    CELECOXIB (CELEBREX) 200 MG CAPSULE    1 po daily as needed; take with food    CLOPIDOGREL (PLAVIX) 75 MG TABLET    Take  by mouth daily.    DONEPEZIL (ARICEPT) 10 MG TABLET    Take 10 mg by mouth nightly.    ERGOCALCIFEROL (ERGOCALCIFEROL) 50,000 UNIT CAPSULE    Take 50,000 Units by mouth every seven (7) days.    HYDRALAZINE (APRESOLINE) 100 MG TABLET    Take 100 mg by mouth three (3) times daily.    LANSOPRAZOLE (PREVACID) 30 MG CAPSULE    Take  by mouth Daily (before breakfast).    LISINOPRIL (PRINIVIL, ZESTRIL) 40 MG TABLET    Take 40 mg by mouth daily.    METFORMIN (GLUCOPHAGE) 500 MG TABLET    Take  by mouth two (2) times daily (with meals).    METOPROLOL (LOPRESSOR) 100 MG TABLET    Take  by mouth two (2) times a day.    SITAGLIPTIN PHOSPHATE (JANUVIA PO)    Take  by mouth.    SPIRONOLACTONE (ALDACTONE PO)    Take 25 mg by mouth daily. Takes 1/2 tab daily    These Medications have changed    No medications on file   Stop Taking    No medications on file

## 2017-11-20 NOTE — ED Triage Notes (Signed)
Pt reports lightheadedness x 3 days.  Pt denies any chest pain.  Pt in NAD and ambulated with a cane.
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
## 2017-12-28 NOTE — Telephone Encounter (Signed)
 Have never had this request previously. Please provide son with fax # so that the facility can fax the appropriate forms required for admission (usually history and physical form) Thank you
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
## 2018-02-13 NOTE — Telephone Encounter (Signed)
 Donepezil  can cause dizziness but it is not likely to cause back pain. She has been on this since at least 2014 at the same dose, so it would be unusual to get new side effects 5 years after starting it. It is likely that there is another explanation and PCP evaluation or urgent care visit would be what I recommend as the next step.

## 2018-04-10 NOTE — Unmapped External Note (Signed)
 Integrated Care Manager Note [facility]   Name:Shilynn L Swire DOB: [birth year] Practice: [facility] ERE:Gnjw [name], MD   Reason for referral:  Lack of social support, medication management and home safety concerns Referral source:  PCP/Specialist Primary diagnosis:  Dementia Insurance:  Medicare Psychosocial:  Requires 24h care and support system concerns Medication review/reconciliation:  Compliance Education:  [employer], home safety and follow-up appts Teaching recipient:  Family Teachback:  Needs reinforcement Assessment completed with::  Family members(s)  MD collaboration: Message to PCP from dPOA Dora Pimpong requesting HH order to assist with medications  Contacted sister by phone. She states pt is no longer staying there with her in [place]. Pt is now back in her home with spouse. She states she noticed worsening dementia while staying there with her. Spouse can hardly walk per sister. She has concerns about his cognition to help care for sister in home. When pt left for home it was with the understanding that she and her spouse were going to ALF, however since returning spouse has not been willing to go per sister She is requesting HH order for pt have HH assist with medications as pt is not able to safely take meds independently with dementia.   Discussed Medicare coverage of skilled care only. Discussed at length with sister that pt is not able to have [facility] nurse come in daily to administer meds. Explained episodic nature of HH and difference between skilled and unskilled care. Explained personal care services available to help with med reminders and ADLs. Offered listing of agencies and resources in the area to assist with ADLs. Further in discussion sister states that she has already located a PCA that is willing to provide services this week $18/hr for both pt and spouse. Pt spouse has someone coming to home 3xweek for  4hrs to help with his needs already through [place] benefits according to sister. Informed of previously suggested resource through [facility] in Attendance. Sister states she has already looked into this but assumed it was for financial assistance with ALF.   She is uncertain if she still has SW with neuro following pt. Will reach out to neuro SW    [name], BSN, RN-BC Integrated Care Manager [facility] [phone]  Home Medication List - Marked as Reviewed on 12/26/17 1505  Medication Sig  atorvastatin (LIPITOR) 80 mg PO TABS Take 1 Tab by Mouth Once a Day.  Blood Sugar Diagnostic (BLOOD GLUCOSE TEST) Misc STRP ONE TOUCH ULTRA 2 TEST STRIPS TEST GLUCOSE DAILY DX E11.9  clopidogrel (PLAVIX) 75 mg PO TABS Take 1 Tab by Mouth Once a Day.  donepezil  (ARICEPT ) 10 mg PO TABS TAKE ONE TABLET BY MOUTH EVERY NIGHT AT BEDTIME  DULoxetine (CYMBALTA) 60 mg PO CPDR Take 1 Cap by Mouth Once a Day. Do not cut/ crush/chew.  ergocalciferol (VITAMIN D2) 50,000 unit PO CAPS TAKE 1 CAPSULE BY MOUTH EVERY 7 DAYS.  hydrALAZINE (APRESOLINE) 100 mg PO TABS Take 1 Tab by Mouth 3 Times Daily for 60 days. ESTABLISH WITH NEW PCP OR SCHEDULE FOLLOW UP WITHIN 60 DAY TIME FRAME  lansoprazole (PREVACID 24HR) 15 mg PO CPDR Take 1 Cap by Mouth Every  Morning Before Breakfast.  lisinopril  (PRINIVIL ) 40 mg PO TABS TAKE ONE TABLET BY MOUTH DAILY  metFORMIN (GLUCOPHAGE) 500 mg PO TABS Take 1 Tab by Mouth Twice Daily.  metoprolol  (LOPRESSOR ) 100 mg PO TABS Take 1 Tab by Mouth Twice Daily.  SITagliptin (JANUVIA) 100 mg  PO TABS Take 1 Tab by Mouth Once a Day.  spironolactone (ALDACTONE) 25 mg PO TABS TAKE 1/2 TABLET BY MOUTH DAILY  spironolactone (ALDACTONE) 25 mg PO TABS TAKE 1/2 TABLET BY MOUTH ONCE A DAY.  TYPE-IN MEDICATION 1 Dose by Misc.(Non-Drug; Combo Route) route As Directed. Glucometer    Past Medical History:  Diagnosis Date  . Back pain   . Colon polyp   . Diabetes mellitus (HCC)   . Esophageal  reflux   . Hemorrhoid   . Hypercholesteremia   . Hypertension   . Knee pain   . Osteopenia   . Piercing   . Unspecified cerebral artery occlusion with cerebral infarction     Patient Care Team: Fitzharris, Candis MATSU, MD as PCP - General Tessa Stagger, MD (Surgery) Enochs, Garnette Anes, OD as Consulting Provider (Ophthalmology) Jock Donnice BROCKS, DPM as Consulting Provider (Podiatry) Alaine Anes NOVAK, MD as Consulting Provider (Orthopedic Surgery) Currie JULIANNA Birmingham, MD as Consulting Provider (Gastroenterology)

## 2018-04-12 NOTE — Telephone Encounter (Signed)
 Answer is yes, someone could do this, however concerns:  1.  Does pt have a monthly dispenser 2.  Who would ensure that pt actually takes medication

## 2018-04-23 NOTE — Unmapped External Note (Signed)
 Integrated Care Manager Note [facility]   Name:Theresa Mendez DOB: 1945-12-12 Practice: [facility] ERE:Gnjw [name], MD  F/u call to sister [name] . She states there is a plan in place for family friend [name] to bring pt to appt tomorrow to see PCP. Reminded to bring list of current meds to visit She states she would like PCP to document if pt is able to make sound decisions regarding medical affairs independently. She reports she will need to fax document to PCP office. Discussed again with sister private pay options for caregivers.    Confirmed documents from [facility] received by PCP office.     [name], BSN, RN-BC Integrated Care Manager [facility] [phone]
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
## 2018-04-24 NOTE — Unmapped External Note (Signed)
 Integrated Care Manager Note [facility] OFFICE VISIT   Name:Katee L Gottschall DOB: 10/23/1945 Practice: [facility] ERE:Gnjw [name], MD  Pt seen by PCP today in office. PCP able to provide documentation supporting pt ability to make decisions independently. 2nd clinical opinion needed. PCP requesting neurology input  Call to neuro office and spoke with [facility]. LOV in March indicates mild cognitive impairment. Pt will need to be seen in office for assessment. First available appt in January. Pt on wait list for earlier appt if available.    [name], BSN, RN-BC Integrated Care Manager [facility] [phone]'

## 2018-04-25 NOTE — Unmapped External Note (Signed)
 Integrated Care Manager Note [facility]   Name:Jerriann L Schimek DOB: 10-06-1945 Practice: [facility] ERE:Gnjw [name], MD  Call to [facility] and spoke with Ms [place]. She states she spoke with sister Princella on Monday and explained admission process as well as 30 day window after neg TB screen. Per Ms Blain pt caregiver informed her she would not be able to come in person to complete admissions paperwork for a few months  Call to sister Princella to explain the ALF admissions process, labs and documents needed. She states she is working on plan to have pts son assist with placement instead of her. Importance of family communicating and developing plan for pt safety discussed with sister. Sister agreed to call after family communicates and puts together plan.     [name], BSN, RN-BC Integrated Care Manager [facility] [phone]

## 2018-05-14 ENCOUNTER — Inpatient Hospital Stay
Admit: 2018-05-14 | Discharge: 2018-05-15 | Disposition: A | Discharge status: Home / Self Care | Payer: MEDICARE | Attending: Emergency Medicine

## 2018-05-14 DIAGNOSIS — M545 Low back pain: Secondary | ICD-10-CM

## 2018-05-14 MED ORDER — KETOROLAC TROMETHAMINE 15 MG/ML INJECTION
15 mg/mL | INTRAMUSCULAR | Status: AC
Start: 2018-05-14 — End: 2018-05-14
  Administered 2018-05-15: 01:00:00 via INTRAVENOUS

## 2018-05-14 MED FILL — KETOROLAC TROMETHAMINE 15 MG/ML INJECTION: 15 mg/mL | INTRAMUSCULAR | Qty: 1

## 2018-05-14 NOTE — ED Notes (Signed)
8:38 PM rec'd pt in s/o to check labs/urine.  Results noted inc gluc.  No s/o dka.  D/w pt, she req flexeril for pain.  Declines ct abd or further testing as offered.  No emc. Det ret inst given. To dc per pt req and sign out plan.

## 2018-05-14 NOTE — ED Notes (Signed)
Assisted patient to bathroom to obtain urine specimen.

## 2018-05-14 NOTE — ED Notes (Signed)
Attempted x 2 to obtain IV site without success.

## 2018-05-14 NOTE — ED Notes (Signed)
Patient given copy of dc instructions and script(s).  Patient verbalized understanding of instructions and script (s).  Patient given a current medication reconciliation form and verbalized understanding of their medications.   Patient verbalized understanding of the importance of discussing medications with  his or her physician or clinic they will be following up with.  Patient alert and oriented and in no acute distress.  Patient discharged home ambulatory with self and family.

## 2018-05-14 NOTE — ED Provider Notes (Signed)
HPI patient presents the ER today complaining of exacerbation of her low back pain she says she has episodic symptoms of right-sided lower back pain and usually treats it with Tylenol.  She says today's episode started about 24 hours ago and is in the same location but has only given partial relief to the Tylenol.  She says the pain is a sharp stabbing pain in the right lower back that radiates to the right hip but does not involve the right leg.  She denies any changes in bowel or bladder habits.  She denies any foot or leg pain.  She denies any recent fall or trauma.  She was also concerned that her blood pressure may be elevated but when she was told at triage that her blood pressure looked okay she has now retracted that complaint.  No other complaints given at this time.    Past Medical History:   Diagnosis Date   ??? Alzheimer's dementia (HCC)    ??? Arthritis    ??? Back pain    ??? Diabetes (HCC)    ??? GERD (gastroesophageal reflux disease)    ??? High cholesterol    ??? Hypertension    ??? Lumbar stenosis    ??? Osteopenia        Past Surgical History:   Procedure Laterality Date   ??? ABDOMEN SURGERY PROC UNLISTED      Scar tissue removed from abdomen   ??? HX COLECTOMY     ??? HX HERNIA REPAIR     ??? HX HYSTERECTOMY     ??? HX OTHER SURGICAL      Colon polyp removal         Family History:   Problem Relation Age of Onset   ??? Alzheimer Mother    ??? Stroke Mother    ??? Hypertension Mother    ??? Diabetes Mother    ??? Elevated Lipids Mother    ??? Hypertension Father    ??? Cancer Father         Prostate   ??? Other Father         Back problems   ??? Diabetes Sister    ??? Hypertension Sister    ??? Hypertension Brother    ??? Cancer Brother         Prostate x 4 brothers       Social History     Socioeconomic History   ??? Marital status: MARRIED     Spouse name: Not on file   ??? Number of children: Not on file   ??? Years of education: Not on file   ??? Highest education level: Not on file   Occupational History   ??? Not on file   Social Needs   ??? Financial  resource strain: Not on file   ??? Food insecurity:     Worry: Not on file     Inability: Not on file   ??? Transportation needs:     Medical: Not on file     Non-medical: Not on file   Tobacco Use   ??? Smoking status: Former Smoker     Types: Cigarettes     Last attempt to quit: 05/12/1984     Years since quitting: 34.0   ??? Smokeless tobacco: Never Used   Substance and Sexual Activity   ??? Alcohol use: No   ??? Drug use: No   ??? Sexual activity: Not on file   Lifestyle   ??? Physical activity:     Days  per week: Not on file     Minutes per session: Not on file   ??? Stress: Not on file   Relationships   ??? Social connections:     Talks on phone: Not on file     Gets together: Not on file     Attends religious service: Not on file     Active member of club or organization: Not on file     Attends meetings of clubs or organizations: Not on file     Relationship status: Not on file   ??? Intimate partner violence:     Fear of current or ex partner: Not on file     Emotionally abused: Not on file     Physically abused: Not on file     Forced sexual activity: Not on file   Other Topics Concern   ??? Not on file   Social History Narrative   ??? Not on file         ALLERGIES: Zocor [simvastatin]    Review of Systems   Constitutional: Negative.    HENT: Negative.    Eyes: Negative.    Respiratory: Negative.    Cardiovascular: Negative.    Gastrointestinal: Negative.    Genitourinary: Negative.    Musculoskeletal: Negative.    Neurological: Negative.    Psychiatric/Behavioral: Negative.        Vitals:    05/14/18 1802 05/14/18 1834 05/14/18 1835   BP: 175/81 158/62    Pulse: 66  62   Resp: 16  20   Temp: 98.9 ??F (37.2 ??C)     SpO2: 95%  99%   Weight: 88.6 kg (195 lb 6.4 oz)     Height: 5\' 3"  (1.6 m)              Physical Exam  Vitals signs and nursing note reviewed.   Constitutional:       Appearance: She is well-developed.   HENT:      Head: Normocephalic and atraumatic.   Eyes:      Conjunctiva/sclera: Conjunctivae normal.      Pupils:  Pupils are equal, round, and reactive to light.   Neck:      Musculoskeletal: Normal range of motion and neck supple.   Cardiovascular:      Rate and Rhythm: Normal rate and regular rhythm.   Pulmonary:      Effort: Pulmonary effort is normal.      Breath sounds: Normal breath sounds.   Abdominal:      Palpations: Abdomen is soft.   Musculoskeletal: Normal range of motion.         General: Tenderness present.      Comments: Mild tenderness to palpation in the right lower paralumbar area no bony pain.  No swelling or skin changes noted.  Normal right hip exam.   Skin:     General: Skin is warm and dry.   Neurological:      Mental Status: She is alert and oriented to person, place, and time.          MDM       Procedures    EKG was read by myself at 1830 showing a normal sinus rhythm rate of 62 with no acute changes.

## 2018-05-14 NOTE — ED Notes (Signed)
Verbal shift change report given to Barbara Cower, Charity fundraiser (oncoming nurse) by Alvino Chapel, RN (offgoing nurse). Report included the following information SBAR, ED Summary, Procedure Summary, MAR and Recent Results. Patient and family advised of change of shift in progress and new nurse arriving to assume care.  ED tech at bedside attempting IV site.  Barbara Cower given Toradol vial for injection when IV site available.  Patient appears in no apparent distress.  Cardiac monitoring continues.

## 2018-05-14 NOTE — ED Notes (Signed)
Pt says she feels light headed.. feels like her bp is high.Angela Potter. also c/o chronic back pain.Angela Potter..Angela Potter

## 2018-05-14 NOTE — ED Notes (Signed)
Patient states that she is unsure if she took her blood pressure medications today.  C/o right lower back and buttock pain. States sciatica pain.

## 2018-05-14 NOTE — Unmapped External Note (Signed)
 Integrated Care Manager Note [facility]   Name:Theresa Mendez DOB: 11/28/1945 Practice: [facility] ERE:Gnjw [name], MD  Call from sister with reports that sister c/o back pain. She is inquiring if sister can have an order for rehab.  Explained medicare criteria for coverage of inpatient rehab stay. No response when offered appt to discuss and evaluate new complaint of back pain.  Family state they have a medical assistant come out to home throughout the week. Sister states she has reached out to pt son to help with mother's care without results. Sister states son reported APS should be able to do something  Sister states she is unaware of APS involvement despite APS's report that they have been in communication with her. Provided with APS number   [name], BSN, RN-BC Integrated Care Manager [facility] [phone]

## 2018-05-14 NOTE — Telephone Encounter (Signed)
 Spoke to sister to explain criteria for Medicare coverage of rehab stay

## 2018-05-14 NOTE — ED Notes (Signed)
Assisted patient to bathroom to obtain urine specimen.

## 2018-05-14 NOTE — ED Notes (Signed)
Patient states that she is unsure if she took her blood pressure medications today.  C/o right lower back and buttock pain. States sciatica pain.

## 2018-05-14 NOTE — ED Notes (Signed)
Patient given copy of dc instructions and script(s).  Patient verbalized understanding of instructions and script (s).  Patient given a current medication reconciliation form and verbalized understanding of their medications.   Patient verbalized understanding of the importance of discussing medications with  his or her physician or clinic they will be following up with.  Patient alert and oriented and in no acute distress.  Patient discharged home ambulatory with self and family.

## 2018-05-14 NOTE — ED Notes (Signed)
Verbal shift change report given to Jason, RN (oncoming nurse) by Ellen, RN (offgoing nurse). Report included the following information SBAR, ED Summary, Procedure Summary, MAR and Recent Results. Patient and family advised of change of shift in progress and new nurse arriving to assume care.  ED tech at bedside attempting IV site.  Jason given Toradol vial for injection when IV site available.  Patient appears in no apparent distress.  Cardiac monitoring continues.

## 2018-05-14 NOTE — ED Notes (Signed)
8:38 PM rec'd pt in s/o to check labs/urine.  Results noted inc gluc.  No s/o dka.  D/w pt, she req flexeril for pain.  Declines ct abd or further testing as offered.  No emc. Det ret inst given. To dc per pt req and sign out plan.

## 2018-05-14 NOTE — ED Provider Notes (Signed)
HPI patient presents the ER today complaining of exacerbation of her low back pain she says she has episodic symptoms of right-sided lower back pain and usually treats it with Tylenol.  She says today's episode started about 24 hours ago and is in the same location but has only given partial relief to the Tylenol.  She says the pain is a sharp stabbing pain in the right lower back that radiates to the right hip but does not involve the right leg.  She denies any changes in bowel or bladder habits.  She denies any foot or leg pain.  She denies any recent fall or trauma.  She was also concerned that her blood pressure may be elevated but when she was told at triage that her blood pressure looked okay she has now retracted that complaint.  No other complaints given at this time.    Past Medical History:   Diagnosis Date   ??? Alzheimer's dementia (HCC)    ??? Arthritis    ??? Back pain    ??? Diabetes (HCC)    ??? GERD (gastroesophageal reflux disease)    ??? High cholesterol    ??? Hypertension    ??? Lumbar stenosis    ??? Osteopenia        Past Surgical History:   Procedure Laterality Date   ??? ABDOMEN SURGERY PROC UNLISTED      Scar tissue removed from abdomen   ??? HX COLECTOMY     ??? HX HERNIA REPAIR     ??? HX HYSTERECTOMY     ??? HX OTHER SURGICAL      Colon polyp removal         Family History:   Problem Relation Age of Onset   ??? Alzheimer Mother    ??? Stroke Mother    ??? Hypertension Mother    ??? Diabetes Mother    ??? Elevated Lipids Mother    ??? Hypertension Father    ??? Cancer Father         Prostate   ??? Other Father         Back problems   ??? Diabetes Sister    ??? Hypertension Sister    ??? Hypertension Brother    ??? Cancer Brother         Prostate x 4 brothers       Social History     Socioeconomic History   ??? Marital status: MARRIED     Spouse name: Not on file   ??? Number of children: Not on file   ??? Years of education: Not on file   ??? Highest education level: Not on file   Occupational History   ??? Not on file   Social Needs    ??? Financial resource strain: Not on file   ??? Food insecurity:     Worry: Not on file     Inability: Not on file   ??? Transportation needs:     Medical: Not on file     Non-medical: Not on file   Tobacco Use   ??? Smoking status: Former Smoker     Types: Cigarettes     Last attempt to quit: 05/12/1984     Years since quitting: 34.0   ??? Smokeless tobacco: Never Used   Substance and Sexual Activity   ??? Alcohol use: No   ??? Drug use: No   ??? Sexual activity: Not on file   Lifestyle   ??? Physical activity:     Days  per week: Not on file     Minutes per session: Not on file   ??? Stress: Not on file   Relationships   ??? Social connections:     Talks on phone: Not on file     Gets together: Not on file     Attends religious service: Not on file     Active member of club or organization: Not on file     Attends meetings of clubs or organizations: Not on file     Relationship status: Not on file   ??? Intimate partner violence:     Fear of current or ex partner: Not on file     Emotionally abused: Not on file     Physically abused: Not on file     Forced sexual activity: Not on file   Other Topics Concern   ??? Not on file   Social History Narrative   ??? Not on file         ALLERGIES: Zocor [simvastatin]    Review of Systems   Constitutional: Negative.    HENT: Negative.    Eyes: Negative.    Respiratory: Negative.    Cardiovascular: Negative.    Gastrointestinal: Negative.    Genitourinary: Negative.    Musculoskeletal: Negative.    Neurological: Negative.    Psychiatric/Behavioral: Negative.        Vitals:    05/14/18 1802 05/14/18 1834 05/14/18 1835   BP: 175/81 158/62    Pulse: 66  62   Resp: 16  20   Temp: 98.9 ??F (37.2 ??C)     SpO2: 95%  99%   Weight: 88.6 kg (195 lb 6.4 oz)     Height: 5\' 3"  (1.6 m)              Physical Exam  Vitals signs and nursing note reviewed.   Constitutional:       Appearance: She is well-developed.   HENT:      Head: Normocephalic and atraumatic.   Eyes:      Conjunctiva/sclera: Conjunctivae normal.       Pupils: Pupils are equal, round, and reactive to light.   Neck:      Musculoskeletal: Normal range of motion and neck supple.   Cardiovascular:      Rate and Rhythm: Normal rate and regular rhythm.   Pulmonary:      Effort: Pulmonary effort is normal.      Breath sounds: Normal breath sounds.   Abdominal:      Palpations: Abdomen is soft.   Musculoskeletal: Normal range of motion.         General: Tenderness present.      Comments: Mild tenderness to palpation in the right lower paralumbar area no bony pain.  No swelling or skin changes noted.  Normal right hip exam.   Skin:     General: Skin is warm and dry.   Neurological:      Mental Status: She is alert and oriented to person, place, and time.          MDM       Procedures    EKG was read by myself at 1830 showing a normal sinus rhythm rate of 62 with no acute changes.

## 2018-05-14 NOTE — ED Notes (Signed)
Attempted x 2 to obtain IV site without success.

## 2018-05-14 NOTE — ED Triage Notes (Signed)
Pt says she feels light headed.. feels like her bp is high.. also c/o chronic back pain...

## 2018-05-15 LAB — CBC WITH AUTOMATED DIFF
ABS. BASOPHILS: 0 10*3/uL (ref 0.0–0.1)
ABS. EOSINOPHILS: 0.1 10*3/uL (ref 0.0–0.4)
ABS. LYMPHOCYTES: 2.5 10*3/uL (ref 0.9–3.6)
ABS. MONOCYTES: 0.4 10*3/uL (ref 0.05–1.2)
ABS. NEUTROPHILS: 2.7 10*3/uL (ref 1.8–8.0)
BASOPHILS: 1 % (ref 0–2)
EOSINOPHILS: 2 % (ref 0–5)
HCT: 36 % (ref 35.0–45.0)
HGB: 11.8 g/dL — ABNORMAL LOW (ref 12.0–16.0)
LYMPHOCYTES: 44 % (ref 21–52)
MCH: 29.9 PG (ref 24.0–34.0)
MCHC: 32.8 g/dL (ref 31.0–37.0)
MCV: 91.1 FL (ref 74.0–97.0)
MONOCYTES: 6 % (ref 3–10)
MPV: 11.1 FL (ref 9.2–11.8)
NEUTROPHILS: 47 % (ref 40–73)
PLATELET: 134 10*3/uL — ABNORMAL LOW (ref 135–420)
RBC: 3.95 M/uL — ABNORMAL LOW (ref 4.20–5.30)
RDW: 12.6 % (ref 11.6–14.5)
WBC: 5.7 10*3/uL (ref 4.6–13.2)

## 2018-05-15 LAB — EKG, 12 LEAD, INITIAL
Atrial Rate: 62 {beats}/min
Calculated P Axis: 52 degrees
Calculated R Axis: 37 degrees
Calculated T Axis: -3 degrees
Diagnosis: NORMAL
P-R Interval: 138 ms
Q-T Interval: 412 ms
QRS Duration: 80 ms
QTC Calculation (Bezet): 418 ms
Ventricular Rate: 62 {beats}/min

## 2018-05-15 LAB — URINALYSIS W/ RFLX MICROSCOPIC
Bilirubin, Urine: NEGATIVE
Bilirubin: NEGATIVE
Blood, Urine: NEGATIVE
Blood: NEGATIVE
Glucose, Ur: >1000 mg/dL — AB
Glucose: >1000 mg/dL — AB
Ketone: NEGATIVE mg/dL
Ketones, Urine: NEGATIVE mg/dL
Leukocyte Esterase, Urine: NEGATIVE
Leukocyte Esterase: NEGATIVE
Nitrite, Urine: NEGATIVE
Nitrites: NEGATIVE
Protein, UA: 30 mg/dL — AB
Protein: 30 mg/dL — AB
Specific Gravity, UA: >1.03 NA — ABNORMAL HIGH (ref 1.005–1.030)
Specific gravity: >1.03 — ABNORMAL HIGH (ref 1.005–1.030)
Urobilinogen, UA, POCT: 0.2 EU/dL (ref 0.2–1.0)
Urobilinogen: 0.2 EU/dL (ref 0.2–1.0)
pH (UA): 5 (ref 5.0–8.0)
pH, UA: 5 (ref 5.0–8.0)

## 2018-05-15 LAB — URINE MICROSCOPIC ONLY
RBC, UA: 0 /hpf (ref 0–5)
RBC: 0 /hpf (ref 0–5)
WBC, UA: 0 /hpf (ref 0–4)
WBC: 0 /hpf (ref 0–4)

## 2018-05-15 LAB — METABOLIC PANEL, BASIC
Anion gap: 6 mmol/L (ref 3.0–18)
BUN/Creatinine ratio: 18 (ref 12–20)
BUN: 13 MG/DL (ref 7.0–18)
CO2: 27 mmol/L (ref 21–32)
Calcium: 9.1 MG/DL (ref 8.5–10.1)
Chloride: 106 mmol/L (ref 100–111)
Creatinine: 0.72 MG/DL (ref 0.6–1.3)
GFR est AA: >60 mL/min/{1.73_m2} (ref >60)
GFR est non-AA: >60 mL/min/{1.73_m2} (ref >60)
Glucose: 315 mg/dL — ABNORMAL HIGH (ref 74–99)
Potassium: 3.8 mmol/L (ref 3.5–5.5)
Sodium: 139 mmol/L (ref 136–145)

## 2018-05-15 LAB — BASIC METABOLIC PANEL
Anion Gap: 6 mmol/L (ref 3.0–18)
BUN: 13 MG/DL (ref 7.0–18)
Bun/Cre Ratio: 18 (ref 12–20)
CO2: 27 mmol/L (ref 21–32)
Calcium: 9.1 MG/DL (ref 8.5–10.1)
Chloride: 106 mmol/L (ref 100–111)
Creatinine: 0.72 MG/DL (ref 0.6–1.3)
EGFR IF NonAfrican American: >60 mL/min/{1.73_m2} (ref >60)
GFR African American: >60 mL/min/{1.73_m2} (ref >60)
Glucose: 315 mg/dL — ABNORMAL HIGH (ref 74–99)
Potassium: 3.8 mmol/L (ref 3.5–5.5)
Sodium: 139 mmol/L (ref 136–145)

## 2018-05-15 LAB — CBC WITH AUTO DIFFERENTIAL
Basophils %: 1 % (ref 0–2)
Basophils Absolute: 0 10*3/uL (ref 0.0–0.1)
Eosinophils %: 2 % (ref 0–5)
Eosinophils Absolute: 0.1 10*3/uL (ref 0.0–0.4)
Hematocrit: 36 % (ref 35.0–45.0)
Hemoglobin: 11.8 g/dL — ABNORMAL LOW (ref 12.0–16.0)
Lymphocytes %: 44 % (ref 21–52)
Lymphocytes Absolute: 2.5 10*3/uL (ref 0.9–3.6)
MCH: 29.9 PG (ref 24.0–34.0)
MCHC: 32.8 g/dL (ref 31.0–37.0)
MCV: 91.1 FL (ref 74.0–97.0)
MPV: 11.1 FL (ref 9.2–11.8)
Monocytes %: 6 % (ref 3–10)
Monocytes Absolute: 0.4 10*3/uL (ref 0.05–1.2)
Neutrophils %: 47 % (ref 40–73)
Neutrophils Absolute: 2.7 10*3/uL (ref 1.8–8.0)
Platelets: 134 10*3/uL — ABNORMAL LOW (ref 135–420)
RBC: 3.95 M/uL — ABNORMAL LOW (ref 4.20–5.30)
RDW: 12.6 % (ref 11.6–14.5)
WBC: 5.7 10*3/uL (ref 4.6–13.2)

## 2018-05-15 LAB — EKG 12-LEAD
Atrial Rate: 62 {beats}/min
Diagnosis: NORMAL
P Axis: 52 degrees
P-R Interval: 138 ms
Q-T Interval: 412 ms
QRS Duration: 80 ms
QTc Calculation (Bazett): 418 ms
R Axis: 37 degrees
T Axis: -3 degrees
Ventricular Rate: 62 {beats}/min

## 2018-05-15 MED ORDER — CYCLOBENZAPRINE 5 MG TAB
5 mg | ORAL_TABLET | Freq: Three times a day (TID) | ORAL | 0 refills | Status: AC | PRN
Start: 2018-05-15 — End: 2018-05-21

## 2018-05-15 MED ORDER — CYCLOBENZAPRINE 10 MG TAB
10 mg | ORAL | Status: AC
Start: 2018-05-15 — End: 2018-05-14
  Administered 2018-05-15: 02:00:00 via ORAL

## 2018-05-15 MED FILL — CYCLOBENZAPRINE 10 MG TAB: 10 mg | ORAL | Qty: 1

## 2018-06-07 NOTE — Progress Notes (Signed)
 ASSESSMENT:   1-dementia Has progressed in my opinion. See 04/24/2018 note for review. Pt has no insight regarding degree of dementia though admits does have diagnosis.  Continues to believe capable of caring for herself.  'why do I need to be in assisted living again?' She is at risk for inadvertently taking her medication inappropriately, or not at all. See letter regarding capacity to make appropriate decisions on her behalf.  PLAN:   Discussed evaluation, treatment and usual course. Patient verbalizes understanding and agrees with plan of care. All questions were answered. Medication Orders this Encounter  Medications  . spironolactone (ALDACTONE) 25 mg PO TABS    Sig: TAKE 1/2 TABLET BY MOUTH ONCE A DAY.    Dispense:  15 Tab    Refill:  1    CYCLE FILL MEDICATION. Authorization is required for next refill.   Side effects of prescribed medicine was discussed. Orders:  No orders of the defined types were placed in this encounter.   Smoking status/exposure - Is there current tobacco use, second hand tobacco exposure, or environmental exposure? No   The patient's medicines, past medical, family and social histories (including tobacco usage) were reviewed and updated as appropriate.   SUBJECTIVE:   Chief Complaint  Patient presents with  . DEMENTIA   72y/o AAMF with dementia, T2DM, HTN, lumbar spinal stenosis.  Pt not accompanied by anyone but tells me a close friend, [name], drove her. Brought pill dispenser but no bottles.  Unable to tell me what the pills are.  Asked me 4 times during encounter if I had refilled hydralazine.  She tells me that I had 'sent to a pharmacy when I was away'.  Explained 4 times that it was sent to [facility] and can be picked up.  Additionally hydralazine appropriately in her pill dispenser three times a day.  Spent thanksgiving with her sister Princella.  Has been back for a week. Pt's mother lives with Princella.  Per pt, mother has  alzheimer's.  Looked up every medication in her pill box which was filled out for 1/2 of today Thursday, Friday and Saturday. One dose of metformin missing from Friday. Lisinopril  40mg , metoprolol  100mg , spironolactone 1/2 tablet, plavix, atorvastatin, januvia all appropriate for 2.5 days. No aricept  nor cymbalta in pill dispenser. HPI   Review of Systems  Unable to perform ROS: Dementia  Endo/Heme/Allergies: Bruises/bleeds easily: dementia.     OBJECTIVE   BP 160/84 (Site: Arm R, Position: Sitting, Cuff Size: Medium)   Pulse 84   Temp 98.4 F (36.9 C) (Oral)   Resp 18   Ht 5' 3.5 (1.613 m)   Wt 87.1 kg (192 lb)   BMI 33.48 kg/m  BMI: 33.48   Physical Exam  Constitutional: No distress.  Cardiovascular: Normal rate and regular rhythm. Exam reveals no gallop.  No murmur heard. Pulmonary/Chest: She has no wheezes. She has no rales.  Musculoskeletal:        General: No edema.  Neurological: She is alert.  Skin: She is not diaphoretic.  Psychiatric: Affect normal.  Nursing note and vitals reviewed.   I have reviewed information entered by the clinical staff and/or patient and verified it as accurate or edited where necessary.

## 2018-06-15 NOTE — Unmapped External Note (Signed)
 Integrated Care Manager Note [facility]   Name:Theresa Mendez DOB: [birth year] Practice: [facility] ERE:Gnjw [name], MD  4:25 PM Attempt to reach sister by phone to follow up  No answer. Left message to return call to ICM   [name], BSN, RN-BC Integrated Care Manager [facility] [phone]

## 2018-06-30 ENCOUNTER — Inpatient Hospital Stay
Admit: 2018-06-30 | Discharge: 2018-06-30 | Disposition: A | Discharge status: Home / Self Care | Payer: MEDICARE | Attending: Emergency Medicine

## 2018-06-30 ENCOUNTER — Emergency Department: Admit: 2018-06-30 | Payer: MEDICARE | Primary: Internal Medicine

## 2018-06-30 DIAGNOSIS — S8001XA Contusion of right knee, initial encounter: Secondary | ICD-10-CM

## 2018-06-30 NOTE — ED Notes (Signed)
Pt states she fell down 4-5 steps yesterday. Today c/o right knee pain. Pt is ambulatory into triage

## 2018-06-30 NOTE — ED Provider Notes (Signed)
HPI patient tripped and fell landed on her right knee several days ago.  Since that time she complains with diffuse soreness of the knee.  She is able to walk but says it hurts her knee when she walks.  She denies a knee swelling.  She denies any ankle or hip pain.  No other complaints given at this time.    Past Medical History:   Diagnosis Date   ??? Alzheimer's dementia (HCC)    ??? Arthritis    ??? Back pain    ??? Diabetes (HCC)    ??? GERD (gastroesophageal reflux disease)    ??? High cholesterol    ??? Hypertension    ??? Lumbar stenosis    ??? Osteopenia        Past Surgical History:   Procedure Laterality Date   ??? ABDOMEN SURGERY PROC UNLISTED      Scar tissue removed from abdomen   ??? HX COLECTOMY     ??? HX HERNIA REPAIR     ??? HX HYSTERECTOMY     ??? HX OTHER SURGICAL      Colon polyp removal         Family History:   Problem Relation Age of Onset   ??? Alzheimer Mother    ??? Stroke Mother    ??? Hypertension Mother    ??? Diabetes Mother    ??? Elevated Lipids Mother    ??? Hypertension Father    ??? Cancer Father         Prostate   ??? Other Father         Back problems   ??? Diabetes Sister    ??? Hypertension Sister    ??? Hypertension Brother    ??? Cancer Brother         Prostate x 4 brothers       Social History     Socioeconomic History   ??? Marital status: MARRIED     Spouse name: Not on file   ??? Number of children: Not on file   ??? Years of education: Not on file   ??? Highest education level: Not on file   Occupational History   ??? Not on file   Social Needs   ??? Financial resource strain: Not on file   ??? Food insecurity:     Worry: Not on file     Inability: Not on file   ??? Transportation needs:     Medical: Not on file     Non-medical: Not on file   Tobacco Use   ??? Smoking status: Former Smoker     Types: Cigarettes     Last attempt to quit: 05/12/1984     Years since quitting: 34.1   ??? Smokeless tobacco: Never Used   Substance and Sexual Activity   ??? Alcohol use: No   ??? Drug use: No   ??? Sexual activity: Not on file   Lifestyle   ??? Physical  activity:     Days per week: Not on file     Minutes per session: Not on file   ??? Stress: Not on file   Relationships   ??? Social connections:     Talks on phone: Not on file     Gets together: Not on file     Attends religious service: Not on file     Active member of club or organization: Not on file     Attends meetings of clubs or organizations: Not on file  Relationship status: Not on file   ??? Intimate partner violence:     Fear of current or ex partner: Not on file     Emotionally abused: Not on file     Physically abused: Not on file     Forced sexual activity: Not on file   Other Topics Concern   ??? Not on file   Social History Narrative   ??? Not on file         ALLERGIES: Zocor [simvastatin]    Review of Systems   Constitutional: Negative.    HENT: Negative.    Respiratory: Negative.    Cardiovascular: Negative.    Gastrointestinal: Negative.    Genitourinary: Negative.    Musculoskeletal: Positive for arthralgias.   Neurological: Negative.    Psychiatric/Behavioral: Negative.        Vitals:    06/30/18 1144   Pulse: (!) 54   Resp: 20   Temp: 98.7 ??F (37.1 ??C)   SpO2: 97%   Weight: 74.8 kg (165 lb)   Height: 5' 3.5" (1.613 m)            Physical Exam  Vitals signs and nursing note reviewed.   Constitutional:       Appearance: She is well-developed.   HENT:      Head: Normocephalic and atraumatic.   Eyes:      Conjunctiva/sclera: Conjunctivae normal.      Pupils: Pupils are equal, round, and reactive to light.   Neck:      Musculoskeletal: Normal range of motion and neck supple.   Cardiovascular:      Rate and Rhythm: Normal rate and regular rhythm.   Pulmonary:      Effort: Pulmonary effort is normal.      Breath sounds: Normal breath sounds.   Musculoskeletal: Normal range of motion.         General: Tenderness present.      Comments: Very mild diffuse tenderness palpation bilaterally.  No joint effusion in the right knee.  No swelling or skin changes noted.  Normal right hip and right ankle exams.    Skin:     General: Skin is warm and dry.   Neurological:      Mental Status: She is alert and oriented to person, place, and time.          MDM         Procedures      Plain films were read by myself as showing no acute changes and no fracture.  I reviewed the results with the patient and her husband they both understand and agree with the disposition and follow-up plan.  Blanche East, MD 1:09 PM

## 2018-06-30 NOTE — ED Notes (Signed)
I have reviewed discharge instructions with the patient and spouse.  The patient and spouse verbalized understanding.  Patient armband removed and shredded

## 2018-06-30 NOTE — ED Triage Notes (Signed)
Pt states she fell down 4-5 steps yesterday. Today c/o right knee pain. Pt is ambulatory into triage

## 2018-06-30 NOTE — ED Provider Notes (Addendum)
HPI patient tripped and fell landed on her right knee several days ago.  Since that time she complains with diffuse soreness of the knee.  She is able to walk but says it hurts her knee when she walks.  She denies a knee swelling.  She denies any ankle or hip pain.  No other complaints given at this time.    Past Medical History:   Diagnosis Date   ??? Alzheimer's dementia (HCC)    ??? Arthritis    ??? Back pain    ??? Diabetes (HCC)    ??? GERD (gastroesophageal reflux disease)    ??? High cholesterol    ??? Hypertension    ??? Lumbar stenosis    ??? Osteopenia        Past Surgical History:   Procedure Laterality Date   ??? ABDOMEN SURGERY PROC UNLISTED      Scar tissue removed from abdomen   ??? HX COLECTOMY     ??? HX HERNIA REPAIR     ??? HX HYSTERECTOMY     ??? HX OTHER SURGICAL      Colon polyp removal         Family History:   Problem Relation Age of Onset   ??? Alzheimer Mother    ??? Stroke Mother    ??? Hypertension Mother    ??? Diabetes Mother    ??? Elevated Lipids Mother    ??? Hypertension Father    ??? Cancer Father         Prostate   ??? Other Father         Back problems   ??? Diabetes Sister    ??? Hypertension Sister    ??? Hypertension Brother    ??? Cancer Brother         Prostate x 4 brothers       Social History     Socioeconomic History   ??? Marital status: MARRIED     Spouse name: Not on file   ??? Number of children: Not on file   ??? Years of education: Not on file   ??? Highest education level: Not on file   Occupational History   ??? Not on file   Social Needs   ??? Financial resource strain: Not on file   ??? Food insecurity:     Worry: Not on file     Inability: Not on file   ??? Transportation needs:     Medical: Not on file     Non-medical: Not on file   Tobacco Use   ??? Smoking status: Former Smoker     Types: Cigarettes     Last attempt to quit: 05/12/1984     Years since quitting: 34.1   ??? Smokeless tobacco: Never Used   Substance and Sexual Activity   ??? Alcohol use: No   ??? Drug use: No   ??? Sexual activity: Not on file   Lifestyle    ??? Physical activity:     Days per week: Not on file     Minutes per session: Not on file   ??? Stress: Not on file   Relationships   ??? Social connections:     Talks on phone: Not on file     Gets together: Not on file     Attends religious service: Not on file     Active member of club or organization: Not on file     Attends meetings of clubs or organizations: Not on file  Relationship status: Not on file   ??? Intimate partner violence:     Fear of current or ex partner: Not on file     Emotionally abused: Not on file     Physically abused: Not on file     Forced sexual activity: Not on file   Other Topics Concern   ??? Not on file   Social History Narrative   ??? Not on file         ALLERGIES: Zocor [simvastatin]    Review of Systems   Constitutional: Negative.    HENT: Negative.    Respiratory: Negative.    Cardiovascular: Negative.    Gastrointestinal: Negative.    Genitourinary: Negative.    Musculoskeletal: Positive for arthralgias.   Neurological: Negative.    Psychiatric/Behavioral: Negative.        Vitals:    06/30/18 1144   Pulse: (!) 54   Resp: 20   Temp: 98.7 ??F (37.1 ??C)   SpO2: 97%   Weight: 74.8 kg (165 lb)   Height: 5' 3.5" (1.613 m)            Physical Exam  Vitals signs and nursing note reviewed.   Constitutional:       Appearance: She is well-developed.   HENT:      Head: Normocephalic and atraumatic.   Eyes:      Conjunctiva/sclera: Conjunctivae normal.      Pupils: Pupils are equal, round, and reactive to light.   Neck:      Musculoskeletal: Normal range of motion and neck supple.   Cardiovascular:      Rate and Rhythm: Normal rate and regular rhythm.   Pulmonary:      Effort: Pulmonary effort is normal.      Breath sounds: Normal breath sounds.   Musculoskeletal: Normal range of motion.         General: Tenderness present.      Comments: Very mild diffuse tenderness palpation bilaterally.  No joint effusion in the right knee.  No swelling or skin changes noted.  Normal  right hip and right ankle exams.   Skin:     General: Skin is warm and dry.   Neurological:      Mental Status: She is alert and oriented to person, place, and time.          MDM         Procedures      Plain films were read by myself as showing no acute changes and no fracture.  I reviewed the results with the patient and her husband they both understand and agree with the disposition and follow-up plan.  Blanche East, MD 1:09 PM

## 2018-07-02 NOTE — Telephone Encounter (Signed)
 Please relay to Ms Theresa Mendez that she investigates which assisted living facility family prefers. Paperwork would then be forwarded to me from the facility and pt would need to be evaluated. Thank you

## 2018-07-12 NOTE — Progress Notes (Signed)
 Please notify brother, Sidra, [phone], that knee film showed arthritis with fluid (reactive)  which orthopedics could withdraw-would help her feel better. No fracture.

## 2018-07-12 NOTE — Progress Notes (Signed)
 ASSESSMENT:   1-HTN bp acceptable today reflecting Spironolactone 1/2 of 25mg  Metoprolol  100mg  bid lisinopril  40mg  Hydralazine 100mg  tid 12/'19 bmp   2-T2DM Prior control suboptimal due to erratic compliance.  Family appears now to have this in hand  Vitals 04/24/2018 06/07/2018 06/07/2018 07/12/2018  Weight (lb) 194 lb 192 lb  183 lb   Foot exam today + ace/statin -reassess [id number] in 2 months reflecting metformin 500mg  bid, januvia 100mg  -2 mo f/u  3-right knee pain Post fall Doubt fracture as pt walking on it.  Recommended orthopedic input, however brother anxious to have pt placed, wishes to defer until this is done -xray knee  4-dementia Alerted Josh to pt's upcoming neurology f/u in March. See 04/24/2018 note Paperwork for [facility] admission aricept  10mg  Note that head ct in past commented on lacunar infarcts, more recent brain MRI 09/03/2012 did not comment upon cerebrovascular event -d/c plavix  -10 yr CV risk currently at 23%, however needs to be weighed against risk of bleeding. Atherosclerosis was noted 03/2013 ct abd/pelvis-aspirin 81mg   5-HPL lipitor 80mg  -level next lab  6-H.M. Per Sidra, transportation included at [employer].  I had revoked pt's license.  Past due for mammogram Colonoscopy in 2013=hyperplastic but 5 yr recall recommended PLAN:   Discussed evaluation, treatment and usual course. Patient and Brother verbalizes understanding and agrees with plan of care. All questions were answered. Medication Orders this Encounter  Medications  . spironolactone (ALDACTONE) 25 mg PO TABS    Sig: TAKE 1/2 TABLET BY MOUTH DAILY    Dispense:  45 Tab    Refill:  1   Side effects of prescribed medicine was discussed. Orders:   Orders Placed This Encounter  Procedures  . KNEE COMPLETE RT 4 VIEWS    Smoking status/exposure - Is there current tobacco use, second hand tobacco exposure, or environmental exposure? No   The patient's medicines, past  medical, family and social histories (including tobacco usage) were reviewed and updated as appropriate.   SUBJECTIVE:   Chief Complaint  Patient presents with  . DEMENTIA  73y/o AAMF is accompanied by brother, Sidra [phone]), who lives in [place]. Family is planning to move pt to [facility] which was toured a week ago.  Pt does not recall the visit. Spouse in a rehab facility after  [facility] hospitalization 3 wks ago.  Another brother in [place]. Per Sidra, he and brother Rotating nights with pt. Had a brother who had come from [place]  as well to help out. Princella, who lives in [place], takes care of mother with dementia.  Dorian, a friend, apparently comes to fill the medication pill box weekly.  Pt had fallen 1/18 in her home-evaluated [facility] ED.  Has had pain since, limping. HPI   Review of Systems  Unable to perform ROS: Dementia     OBJECTIVE   BP 124/84 (Site: Arm R, Position: Sitting, Cuff Size: Medium)   Pulse 72   Temp 98.3 F (36.8 C) (Oral)   Resp 16   Ht 5' 3.5 (1.613 m)   Wt 83 kg (183 lb)   BMI 31.91 kg/m  BMI: 31.91   Physical Exam  Constitutional: She is well-developed, well-nourished, and in no distress.  Onto exam table w/guidance, little assistance  HENT:  Head: Normocephalic and atraumatic.  Mouth/Throat: Oropharynx is clear and moist. No oropharyngeal exudate.  Eyes: Conjunctivae are normal.  Neck: Neck supple. No thyromegaly present.  Cardiovascular: Normal rate, regular rhythm and intact distal pulses. Exam reveals  no gallop and no friction rub.  No murmur heard. No carotid bruit Tr ankle swelling  Pulmonary/Chest: Effort normal and breath sounds normal. She has no wheezes. She has no rales.  Abdominal: There is no tenderness. There is no rebound and no guarding.  Musculoskeletal:        General: No edema.     Comments: Right anterior knee TTP  Lymphadenopathy:    She has no cervical adenopathy.  Neurological: She is  alert. She has normal strength.  Monofilament intact  Season 'between spring and winter' Month=august Year=2016 President=Trump DOB was correct Recognizes me correctly  Skin: Skin is warm and dry.  Psychiatric: Mood and affect normal.  Nursing note and vitals reviewed.   I have reviewed information entered by the clinical staff and/or patient and verified it as accurate or edited where necessary.

## 2018-07-14 ENCOUNTER — Inpatient Hospital Stay
Admit: 2018-07-14 | Discharge: 2018-07-14 | Disposition: A | Discharge status: Home / Self Care | Payer: MEDICARE | Attending: Emergency Medicine

## 2018-07-14 DIAGNOSIS — E1165 Type 2 diabetes mellitus with hyperglycemia: Secondary | ICD-10-CM

## 2018-07-14 LAB — GLUCOSE, POC: Glucose (POC): 345 mg/dL — ABNORMAL HIGH (ref 70–110)

## 2018-07-14 LAB — POCT GLUCOSE: POC Glucose: 345 mg/dL — ABNORMAL HIGH (ref 70–110)

## 2018-07-14 MED ORDER — ACETAMINOPHEN 325 MG TABLET
325 mg | ORAL_TABLET | ORAL | 0 refills | Status: AC | PRN
Start: 2018-07-14 — End: ?

## 2018-07-14 MED ORDER — DICLOFENAC 1 % TOPICAL GEL
1 % | Freq: Four times a day (QID) | CUTANEOUS | 0 refills | Status: AC
Start: 2018-07-14 — End: ?

## 2018-07-14 MED ORDER — METFORMIN 500 MG TAB
500 mg | ORAL_TABLET | Freq: Two times a day (BID) | ORAL | 0 refills | Status: AC
Start: 2018-07-14 — End: ?

## 2018-07-14 NOTE — ED Provider Notes (Signed)
EMERGENCY DEPARTMENT HISTORY AND PHYSICAL EXAM    Date: 07/14/2018  Patient Name: Angela Potter    History of Presenting Illness     Chief Complaint   Patient presents with   ??? High Blood Sugar   ??? Knee Pain         History Provided By: patient     Chief Complaint: knee pain and elevated BP  Duration: few days  Timing:acute  Location: R knee  Quality:aching  Severity:moderate  Modifying Factors: none  Associated Symptoms: hyperglycemia this am       Additional History (Context): Angela Potter is a 73 y.o. female with past medical history Alzheimer's disease, arthritis, high cholesterol, GERD, hypertension, diabetes, and osteopenia who presents with complaints of continued right knee pain for the past few days.  Patient was recently seen and had an x-ray in the ED.  She was diagnosed with knee arthritis.  Patient has a known history of dementia and is currently accompanied by family member.  Patient states her family member checked her blood glucose prior to arrival to the ED.  Glucose level was 364.  Patient is completely asymptomatic in regards to her hyperglycemia.  She reportedly takes metformin but cannot find this medication at her home and is unsure when she last took it.  No other complaints reported at this time.    PCP: Freeman Caldron, MD    Current Outpatient Medications   Medication Sig Dispense Refill   ??? metFORMIN (GLUCOPHAGE) 500 mg tablet Take 1 Tab by mouth two (2) times daily (with meals). 60 Tab 0   ??? acetaminophen (TYLENOL) 325 mg tablet Take 2 Tabs by mouth every four (4) hours as needed for Pain. 30 Tab 0   ??? diclofenac (VOLTAREN) 1 % gel Apply 2 g to affected area four (4) times daily. 1 Each 0   ??? sitagliptin phosphate (JANUVIA PO) Take  by mouth.     ??? celecoxib (CELEBREX) 200 mg capsule 1 po daily as needed; take with food 30 Cap 3   ??? ergocalciferol (ERGOCALCIFEROL) 50,000 unit capsule Take 50,000 Units by mouth every seven (7) days.     ??? lansoprazole (PREVACID) 30 mg capsule Take  by  mouth Daily (before breakfast).     ??? lisinopril (PRINIVIL, ZESTRIL) 40 mg tablet Take 40 mg by mouth daily.     ??? SPIRONOLACTONE (ALDACTONE PO) Take 25 mg by mouth daily. Takes 1/2 tab daily     ??? hydrALAZINE (APRESOLINE) 100 mg tablet Take 100 mg by mouth three (3) times daily.     ??? metoprolol (LOPRESSOR) 100 mg tablet Take  by mouth two (2) times a day.     ??? atorvastatin (LIPITOR) 10 mg tablet Take 10 mg by mouth daily.     ??? clopidogrel (PLAVIX) 75 mg tablet Take  by mouth daily.     ??? donepezil (ARICEPT) 10 mg tablet Take 10 mg by mouth nightly.         Past History     Past Medical History:  Past Medical History:   Diagnosis Date   ??? Alzheimer's dementia (HCC)    ??? Arthritis    ??? Back pain    ??? Diabetes (HCC)    ??? GERD (gastroesophageal reflux disease)    ??? High cholesterol    ??? Hypertension    ??? Lumbar stenosis    ??? Osteopenia        Past Surgical History:  Past Surgical History:   Procedure  Laterality Date   ??? ABDOMEN SURGERY PROC UNLISTED      Scar tissue removed from abdomen   ??? HX COLECTOMY     ??? HX HERNIA REPAIR     ??? HX HYSTERECTOMY     ??? HX OTHER SURGICAL      Colon polyp removal       Family History:  Family History   Problem Relation Age of Onset   ??? Alzheimer Mother    ??? Stroke Mother    ??? Hypertension Mother    ??? Diabetes Mother    ??? Elevated Lipids Mother    ??? Hypertension Father    ??? Cancer Father         Prostate   ??? Other Father         Back problems   ??? Diabetes Sister    ??? Hypertension Sister    ??? Hypertension Brother    ??? Cancer Brother         Prostate x 4 brothers       Social History:  Social History     Tobacco Use   ??? Smoking status: Former Smoker     Types: Cigarettes     Last attempt to quit: 05/12/1984     Years since quitting: 34.1   ??? Smokeless tobacco: Never Used   Substance Use Topics   ??? Alcohol use: No   ??? Drug use: No       Allergies:  Allergies   Allergen Reactions   ??? Zocor [Simvastatin] Unknown (comments)         Review of Systems   Review of Systems   Constitutional:  Negative.  Negative for chills and fever.   HENT: Negative.  Negative for congestion, ear pain and rhinorrhea.    Eyes: Negative.  Negative for pain and redness.   Respiratory: Negative.  Negative for cough, shortness of breath, wheezing and stridor.    Cardiovascular: Negative.  Negative for chest pain and leg swelling.   Gastrointestinal: Negative.  Negative for abdominal pain, constipation, diarrhea, nausea and vomiting.   Genitourinary: Negative.  Negative for dysuria and frequency.   Musculoskeletal: Positive for arthralgias. Negative for back pain and neck pain.   Skin: Negative.  Negative for rash and wound.   Neurological: Negative.  Negative for dizziness, seizures, syncope and headaches.   All other systems reviewed and are negative.    All Other Systems Negative  Physical Exam     Vitals:    07/14/18 1300   BP: 137/84   Pulse: 60   Resp: 20   Temp: 98.6 ??F (37 ??C)   SpO2: 96%   Weight: 79.4 kg (175 lb)   Height: 5' 3.5" (1.613 m)     Physical Exam  Vitals signs and nursing note reviewed.   Constitutional:       General: She is not in acute distress.     Appearance: She is well-developed. She is not diaphoretic.   HENT:      Head: Normocephalic and atraumatic.   Eyes:      General: No scleral icterus.        Right eye: No discharge.         Left eye: No discharge.      Conjunctiva/sclera: Conjunctivae normal.   Neck:      Musculoskeletal: Normal range of motion and neck supple.   Cardiovascular:      Rate and Rhythm: Normal rate and regular rhythm.  Heart sounds: Normal heart sounds. No murmur. No friction rub. No gallop.    Pulmonary:      Effort: Pulmonary effort is normal. No respiratory distress.      Breath sounds: Normal breath sounds. No stridor. No wheezing or rales.   Musculoskeletal: Normal range of motion.         General: Tenderness present.      Comments: Mild TTP noted to the inferior portion of the patella. ROM of the knee intact. No deformity or significant edema noted.    Skin:      General: Skin is warm and dry.      Findings: No erythema or rash.   Neurological:      General: No focal deficit present.      Mental Status: She is alert and oriented to person, place, and time. Mental status is at baseline.      Coordination: Coordination normal.      Comments: Gait is steady and patient exhibits no evidence of ataxia. Patient is able to ambulate without difficulty. No focal neurological deficit noted. No facial droop or slurred speech noted.  Mentation is at baseline with patient's dementia.    Psychiatric:         Behavior: Behavior normal.         Thought Content: Thought content normal.                Diagnostic Study Results     Labs -     Recent Results (from the past 12 hour(s))   GLUCOSE, POC    Collection Time: 07/14/18  1:12 PM   Result Value Ref Range    Glucose (POC) 345 (H) 70 - 110 mg/dL       Radiologic Studies -   No orders to display     CT Results  (Last 48 hours)    None        CXR Results  (Last 48 hours)    None            Medical Decision Making   I am the first provider for this patient.    I reviewed the vital signs, available nursing notes, past medical history, past surgical history, family history and social history.    Vital Signs-Reviewed the patient's vital signs.        Records Reviewed: Aneri Slagel J Asuka Dusseau, PA-C     Procedures:  Procedures    Provider Notes (Medical Decision Making): Impression:  Hyperglycemia, knee arthritis    No recent fall or injury reported.  No indication for repeat x-ray.  No evidence of septic joint is noted on exam.  Plan to treat the patient's knee pain with Voltaren gel and Tylenol.  Patient's hyperglycemia is asymptomatic.  Glucose in the ED is 345.  No indication for labs or imaging at this time.  We will plan to refill patient's metformin and recommend close PCP follow-up.  Patient and her family member agree with this plan. August Gosser J Kendle Erker, PA-C     MED RECONCILIATION:  No current facility-administered medications for this encounter.       Current Outpatient Medications   Medication Sig   ??? metFORMIN (GLUCOPHAGE) 500 mg tablet Take 1 Tab by mouth two (2) times daily (with meals).   ??? acetaminophen (TYLENOL) 325 mg tablet Take 2 Tabs by mouth every four (4) hours as needed for Pain.   ??? diclofenac (VOLTAREN) 1 % gel Apply 2 g to affected area four (4) times daily.   ???  sitagliptin phosphate (JANUVIA PO) Take  by mouth.   ??? celecoxib (CELEBREX) 200 mg capsule 1 po daily as needed; take with food   ??? ergocalciferol (ERGOCALCIFEROL) 50,000 unit capsule Take 50,000 Units by mouth every seven (7) days.   ??? lansoprazole (PREVACID) 30 mg capsule Take  by mouth Daily (before breakfast).   ??? lisinopril (PRINIVIL, ZESTRIL) 40 mg tablet Take 40 mg by mouth daily.   ??? SPIRONOLACTONE (ALDACTONE PO) Take 25 mg by mouth daily. Takes 1/2 tab daily   ??? hydrALAZINE (APRESOLINE) 100 mg tablet Take 100 mg by mouth three (3) times daily.   ??? metoprolol (LOPRESSOR) 100 mg tablet Take  by mouth two (2) times a day.   ??? atorvastatin (LIPITOR) 10 mg tablet Take 10 mg by mouth daily.   ??? clopidogrel (PLAVIX) 75 mg tablet Take  by mouth daily.   ??? donepezil (ARICEPT) 10 mg tablet Take 10 mg by mouth nightly.       Disposition:  D/c    DISCHARGE NOTE:   Patient is stable for discharge at this time. I have discussed all the findings from today's work up with the patient, including lab results and imaging. I have answered all questions. Rx for metformin tylenol and voltaran gel given. Rest and close follow-up with the PCP recommended this week. Return to the ED immediately for any new or worsening symptoms.  Anie Juniel J Noor Witte, PA-C     Follow-up Information     Follow up With Specialties Details Why Contact Info    [facility]  Schedule an appointment as soon as possible for a visit in 1 week  [address] [place], [address]  [place] [address]  [phone]    Freeman Caldron, MD Internal Medicine Schedule an appointment as soon as possible for a visit  in 1 week  [address]  [address]  [place] [address]  [phone]      [facility] EMERGENCY DEPT Emergency Medicine  As needed, If symptoms worsen [address] [place] [address]  [phone]          Discharge Medication List as of 07/14/2018  1:47 PM      START taking these medications    Details   acetaminophen (TYLENOL) 325 mg tablet Take 2 Tabs by mouth every four (4) hours as needed for Pain., Print, Disp-30 Tab, R-0      diclofenac (VOLTAREN) 1 % gel Apply 2 g to affected area four (4) times daily., Print, Disp-1 Each, R-0         CONTINUE these medications which have CHANGED    Details   metFORMIN (GLUCOPHAGE) 500 mg tablet Take 1 Tab by mouth two (2) times daily (with meals)., Print, Disp-60 Tab, R-0         CONTINUE these medications which have NOT CHANGED    Details   sitagliptin phosphate (JANUVIA PO) Take  by mouth., Historical Med      celecoxib (CELEBREX) 200 mg capsule 1 po daily as needed; take with food, Print, Disp-30 Cap, R-3      ergocalciferol (ERGOCALCIFEROL) 50,000 unit capsule Take 50,000 Units by mouth every seven (7) days., Historical Med      lansoprazole (PREVACID) 30 mg capsule Take  by mouth Daily (before breakfast)., Historical Med      lisinopril (PRINIVIL, ZESTRIL) 40 mg tablet Take 40 mg by mouth daily., Historical Med      SPIRONOLACTONE (ALDACTONE PO) Take 25 mg by mouth daily.  Takes 1/2 tab daily, Historical Med      hydrALAZINE (APRESOLINE) 100 mg tablet Take 100 mg by mouth three (3) times daily., Historical Med      metoprolol (LOPRESSOR) 100 mg tablet Take  by mouth two (2) times a day., Historical Med      atorvastatin (LIPITOR) 10 mg tablet Take 10 mg by mouth daily., Historical Med      clopidogrel (PLAVIX) 75 mg tablet Take  by mouth daily., Historical Med      donepezil (ARICEPT) 10 mg tablet Take 10 mg by mouth nightly., Historical Med               Diagnosis     Clinical Impression:   1. Hyperglycemia    2. Medication refill    3. Arthritis of knee     4. 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

## 2018-07-14 NOTE — ED Notes (Signed)
Pt c/o right knee pain that is on-going. Saw her PCP on 1/30 for same. Pt's sister states her blood sugar was 364 this morning. Pt has dementia. Pt's sister states she could not find her Metformin at her house

## 2018-07-14 NOTE — ED Provider Notes (Signed)
EMERGENCY DEPARTMENT HISTORY AND PHYSICAL EXAM    Date: 07/14/2018  Patient Name: Angela Potter    History of Presenting Illness     Chief Complaint   Patient presents with   ??? High Blood Sugar   ??? Knee Pain         History Provided By: patient     Chief Complaint: knee pain and elevated BP  Duration: few days  Timing:acute  Location: R knee  Quality:aching  Severity:moderate  Modifying Factors: none  Associated Symptoms: hyperglycemia this am       Additional History (Context): Angela Potter is a 73 y.o. female with past medical history Alzheimer's disease, arthritis, high cholesterol, GERD, hypertension, diabetes, and osteopenia who presents with complaints of continued right knee pain for the past few days.  Patient was recently seen and had an x-ray in the ED.  She was diagnosed with knee arthritis.  Patient has a known history of dementia and is currently accompanied by family member.  Patient states her family member checked her blood glucose prior to arrival to the ED.  Glucose level was 364.  Patient is completely asymptomatic in regards to her hyperglycemia.  She reportedly takes metformin but cannot find this medication at her home and is unsure when she last took it.  No other complaints reported at this time.    PCP: Freeman Caldron, MD    Current Outpatient Medications   Medication Sig Dispense Refill   ??? metFORMIN (GLUCOPHAGE) 500 mg tablet Take 1 Tab by mouth two (2) times daily (with meals). 60 Tab 0   ??? acetaminophen (TYLENOL) 325 mg tablet Take 2 Tabs by mouth every four (4) hours as needed for Pain. 30 Tab 0   ??? diclofenac (VOLTAREN) 1 % gel Apply 2 g to affected area four (4) times daily. 1 Each 0   ??? sitagliptin phosphate (JANUVIA PO) Take  by mouth.     ??? celecoxib (CELEBREX) 200 mg capsule 1 po daily as needed; take with food 30 Cap 3   ??? ergocalciferol (ERGOCALCIFEROL) 50,000 unit capsule Take 50,000 Units by mouth every seven (7) days.      ??? lansoprazole (PREVACID) 30 mg capsule Take  by mouth Daily (before breakfast).     ??? lisinopril (PRINIVIL, ZESTRIL) 40 mg tablet Take 40 mg by mouth daily.     ??? SPIRONOLACTONE (ALDACTONE PO) Take 25 mg by mouth daily. Takes 1/2 tab daily     ??? hydrALAZINE (APRESOLINE) 100 mg tablet Take 100 mg by mouth three (3) times daily.     ??? metoprolol (LOPRESSOR) 100 mg tablet Take  by mouth two (2) times a day.     ??? atorvastatin (LIPITOR) 10 mg tablet Take 10 mg by mouth daily.     ??? clopidogrel (PLAVIX) 75 mg tablet Take  by mouth daily.     ??? donepezil (ARICEPT) 10 mg tablet Take 10 mg by mouth nightly.         Past History     Past Medical History:  Past Medical History:   Diagnosis Date   ??? Alzheimer's dementia (HCC)    ??? Arthritis    ??? Back pain    ??? Diabetes (HCC)    ??? GERD (gastroesophageal reflux disease)    ??? High cholesterol    ??? Hypertension    ??? Lumbar stenosis    ??? Osteopenia        Past Surgical History:  Past Surgical History:   Procedure  Laterality Date   ??? ABDOMEN SURGERY PROC UNLISTED      Scar tissue removed from abdomen   ??? HX COLECTOMY     ??? HX HERNIA REPAIR     ??? HX HYSTERECTOMY     ??? HX OTHER SURGICAL      Colon polyp removal       Family History:  Family History   Problem Relation Age of Onset   ??? Alzheimer Mother    ??? Stroke Mother    ??? Hypertension Mother    ??? Diabetes Mother    ??? Elevated Lipids Mother    ??? Hypertension Father    ??? Cancer Father         Prostate   ??? Other Father         Back problems   ??? Diabetes Sister    ??? Hypertension Sister    ??? Hypertension Brother    ??? Cancer Brother         Prostate x 4 brothers       Social History:  Social History     Tobacco Use   ??? Smoking status: Former Smoker     Types: Cigarettes     Last attempt to quit: 05/12/1984     Years since quitting: 34.1   ??? Smokeless tobacco: Never Used   Substance Use Topics   ??? Alcohol use: No   ??? Drug use: No       Allergies:  Allergies   Allergen Reactions   ??? Zocor [Simvastatin] Unknown (comments)          Review of Systems   Review of Systems   Constitutional: Negative.  Negative for chills and fever.   HENT: Negative.  Negative for congestion, ear pain and rhinorrhea.    Eyes: Negative.  Negative for pain and redness.   Respiratory: Negative.  Negative for cough, shortness of breath, wheezing and stridor.    Cardiovascular: Negative.  Negative for chest pain and leg swelling.   Gastrointestinal: Negative.  Negative for abdominal pain, constipation, diarrhea, nausea and vomiting.   Genitourinary: Negative.  Negative for dysuria and frequency.   Musculoskeletal: Positive for arthralgias. Negative for back pain and neck pain.   Skin: Negative.  Negative for rash and wound.   Neurological: Negative.  Negative for dizziness, seizures, syncope and headaches.   All other systems reviewed and are negative.    All Other Systems Negative  Physical Exam     Vitals:    07/14/18 1300   BP: 137/84   Pulse: 60   Resp: 20   Temp: 98.6 ??F (37 ??C)   SpO2: 96%   Weight: 79.4 kg (175 lb)   Height: 5' 3.5" (1.613 m)     Physical Exam  Vitals signs and nursing note reviewed.   Constitutional:       General: She is not in acute distress.     Appearance: She is well-developed. She is not diaphoretic.   HENT:      Head: Normocephalic and atraumatic.   Eyes:      General: No scleral icterus.        Right eye: No discharge.         Left eye: No discharge.      Conjunctiva/sclera: Conjunctivae normal.   Neck:      Musculoskeletal: Normal range of motion and neck supple.   Cardiovascular:      Rate and Rhythm: Normal rate and regular rhythm.  Heart sounds: Normal heart sounds. No murmur. No friction rub. No gallop.    Pulmonary:      Effort: Pulmonary effort is normal. No respiratory distress.      Breath sounds: Normal breath sounds. No stridor. No wheezing or rales.   Musculoskeletal: Normal range of motion.         General: Tenderness present.      Comments: Mild TTP noted to the inferior portion of the patella. ROM of  the knee intact. No deformity or significant edema noted.    Skin:     General: Skin is warm and dry.      Findings: No erythema or rash.   Neurological:      General: No focal deficit present.      Mental Status: She is alert and oriented to person, place, and time. Mental status is at baseline.      Coordination: Coordination normal.      Comments: Gait is steady and patient exhibits no evidence of ataxia. Patient is able to ambulate without difficulty. No focal neurological deficit noted. No facial droop or slurred speech noted.  Mentation is at baseline with patient's dementia.    Psychiatric:         Behavior: Behavior normal.         Thought Content: Thought content normal.                Diagnostic Study Results     Labs -     Recent Results (from the past 12 hour(s))   GLUCOSE, POC    Collection Time: 07/14/18  1:12 PM   Result Value Ref Range    Glucose (POC) 345 (H) 70 - 110 mg/dL       Radiologic Studies -   No orders to display     CT Results  (Last 48 hours)    None        CXR Results  (Last 48 hours)    None            Medical Decision Making   I am the first provider for this patient.    I reviewed the vital signs, available nursing notes, past medical history, past surgical history, family history and social history.    Vital Signs-Reviewed the patient's vital signs.        Records Reviewed: Edina Winningham J Nasreen Goedecke, PA-C     Procedures:  Procedures    Provider Notes (Medical Decision Making): Impression:  Hyperglycemia, knee arthritis    No recent fall or injury reported.  No indication for repeat x-ray.  No evidence of septic joint is noted on exam.  Plan to treat the patient's knee pain with Voltaren gel and Tylenol.  Patient's hyperglycemia is asymptomatic.  Glucose in the ED is 345.  No indication for labs or imaging at this time.  We will plan to refill patient's metformin and recommend close PCP follow-up.  Patient and her family member agree with this plan. Glynda Soliday J Elisa Kutner, PA-C      MED RECONCILIATION:  No current facility-administered medications for this encounter.      Current Outpatient Medications   Medication Sig   ??? metFORMIN (GLUCOPHAGE) 500 mg tablet Take 1 Tab by mouth two (2) times daily (with meals).   ??? acetaminophen (TYLENOL) 325 mg tablet Take 2 Tabs by mouth every four (4) hours as needed for Pain.   ??? diclofenac (VOLTAREN) 1 % gel Apply 2 g to affected area four (4) times daily.   ???  sitagliptin phosphate (JANUVIA PO) Take  by mouth.   ??? celecoxib (CELEBREX) 200 mg capsule 1 po daily as needed; take with food   ??? ergocalciferol (ERGOCALCIFEROL) 50,000 unit capsule Take 50,000 Units by mouth every seven (7) days.   ??? lansoprazole (PREVACID) 30 mg capsule Take  by mouth Daily (before breakfast).   ??? lisinopril (PRINIVIL, ZESTRIL) 40 mg tablet Take 40 mg by mouth daily.   ??? SPIRONOLACTONE (ALDACTONE PO) Take 25 mg by mouth daily. Takes 1/2 tab daily   ??? hydrALAZINE (APRESOLINE) 100 mg tablet Take 100 mg by mouth three (3) times daily.   ??? metoprolol (LOPRESSOR) 100 mg tablet Take  by mouth two (2) times a day.   ??? atorvastatin (LIPITOR) 10 mg tablet Take 10 mg by mouth daily.   ??? clopidogrel (PLAVIX) 75 mg tablet Take  by mouth daily.   ??? donepezil (ARICEPT) 10 mg tablet Take 10 mg by mouth nightly.       Disposition:  D/c    DISCHARGE NOTE:   Patient is stable for discharge at this time. I have discussed all the findings from today's work up with the patient, including lab results and imaging. I have answered all questions. Rx for metformin tylenol and voltaran gel given. Rest and close follow-up with the PCP recommended this week. Return to the ED immediately for any new or worsening symptoms.  Alaija Ruble J Milcah Dulany, PA-C     Follow-up Information     Follow up With Specialties Details Why Contact Info    [facility]  Schedule an appointment as soon as possible for a visit in 1 week  [address] [place], [address]  [place] [address]  [phone]     Freeman Caldron, MD Internal Medicine Schedule an appointment as soon as possible for a visit in 1 week  [address]  [address]  [place] [address]  [phone]      [facility] EM[facility] Emergency Medicine  As needed, If symptoms worsen [address] [place] [address]  [phone]          Discharge Medication List as of 07/14/2018  1:47 PM      START taking these medications    Details   acetaminophen (TYLENOL) 325 mg tablet Take 2 Tabs by mouth every four (4) hours as needed for Pain., Print, Disp-30 Tab, R-0      diclofenac (VOLTAREN) 1 % gel Apply 2 g to affected area four (4) times daily., Print, Disp-1 Each, R-0         CONTINUE these medications which have CHANGED    Details   metFORMIN (GLUCOPHAGE) 500 mg tablet Take 1 Tab by mouth two (2) times daily (with meals)., Print, Disp-60 Tab, R-0         CONTINUE these medications which have NOT CHANGED    Details   sitagliptin phosphate (JANUVIA PO) Take  by mouth., Historical Med      celecoxib (CELEBREX) 200 mg capsule 1 po daily as needed; take with food, Print, Disp-30 Cap, R-3      ergocalciferol (ERGOCALCIFEROL) 50,000 unit capsule Take 50,000 Units by mouth every seven (7) days., Historical Med      lansoprazole (PREVACID) 30 mg capsule Take  by mouth Daily (before breakfast)., Historical Med      lisinopril (PRINIVIL, ZESTRIL) 40 mg tablet Take 40 mg by mouth daily., Historical Med      SPIRONOLACTONE (ALDACTONE PO) Take 25 mg by mouth daily.  Takes 1/2 tab daily, Historical Med      hydrALAZINE (APRESOLINE) 100 mg tablet Take 100 mg by mouth three (3) times daily., Historical Med      metoprolol (LOPRESSOR) 100 mg tablet Take  by mouth two (2) times a day., Historical Med      atorvastatin (LIPITOR) 10 mg tablet Take 10 mg by mouth daily., Historical Med      clopidogrel (PLAVIX) 75 mg tablet Take  by mouth daily., Historical Med      donepezil (ARICEPT) 10 mg tablet Take 10 mg by mouth nightly., Historical Med                Diagnosis     Clinical Impression:   1. Hyperglycemia    2. Medication refill    3. Arthritis of knee    4. 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

## 2018-07-14 NOTE — Other (Signed)
Angela Potter is a 73 y.o. female that was discharged in serious. Pt was accompanied by sister . Pt is not driving. The patients diagnosis, condition and treatment were explained to  patient and aftercare instructions were given.  The patient and caregiver verbalized understanding. Patient discharged without removing armband. Informed of privacy risks if armband lost or stolen .

## 2018-07-14 NOTE — ED Triage Notes (Addendum)
Pt c/o right knee pain that is on-going. Saw her PCP on 1/30 for same. Pt's sister states her blood sugar was 364 this morning. Pt has dementia. Pt's sister states she could not find her Metformin at her house

## 2018-07-18 NOTE — Telephone Encounter (Signed)
 This was already done
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
## 2018-10-18 NOTE — Progress Notes (Signed)
 TELEHEALTH NOTE   Office Visit - Service Date: 10/18/18 Assessment & Plan      Chief Complaint  No chief complaint on file.  Spoke with sister, Princella, [phone] She had not called for an appointment. Apparently pt had called us  on her own. Pt is currently at [facility] Princella states that pt now believes that she is still working for her brother and that at work now.  History of Present Illness     Review of Systems     Home Medications   No outpatient medications have been marked as taking for the 10/18/18 encounter (Appointment) with Fitzharris, Candis MATSU, MD.    Past Histories   The patient's medical, family, and social history (including tobacco usage) were reviewed and updated as appropriate.  Tobacco History reviewed: Social History   Tobacco Use  Smoking Status Former Smoker  Smokeless Tobacco Never Used  Tobacco Comment   quit 1990's    Counseling given: Not Answered Comment: quit 1990's   Physical Exam  Vital Signs: There were no vitals taken for this visit.      Recent Results & Studies  No new labs to review.       I have reviewed information entered by the clinical staff and/or patient and verified it as accurate or edited where necessary.  Signature: Candis MATSU Blacksmith, MD [facility] Dept: [phone] Dept Fax: [phone]  Provider Location: Office             Name of Location: [place] City: [place] State: [place]  Patient Location: Home Name of Location:   home                                  City: [place]                                                             State: [place]                                           Consent for Electronic Treatment:  This visit was conducted with the use of an interactive audio and/or video telecommunications system that permits real-time communication between the patient and this provider. The patient has submitted  their consent to be treated electronically by way of this video technology.  The risks and limitations of the process of telemedicine have been conveyed verbally during this encounter. This is an erroneous encounter.  It has been closed for administrative reasons.   10/18/2018 Candis MATSU Blacksmith, MD

## 2019-03-25 NOTE — Telephone Encounter (Signed)
 Pt currently in memory care facility
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
## 2019-11-20 ENCOUNTER — Encounter: Payer: Self-pay | Admitting: Neurology

## 2019-11-26 DIAGNOSIS — M545 Low back pain, unspecified: Secondary | ICD-10-CM | POA: Insufficient documentation

## 2019-11-29 NOTE — Progress Notes (Signed)
 Subjective:    Patient ID: Theresa Mendez is a 74 y.o. (DOB August 25, 1945) female.  No chief complaint on file.    Theresa Mendez presents today for cardiology evaluation of palpitations.  History of hypertension and Alzheimer's disease.  History of stroke in the remote past.  [place] .  She was accompanied by her sister on this visit who assisted with the history.  Palpitations are described as an irregular rhythm.  No history of atrial fibrillation.  Hypertension uncontrolled on a 4 drug medical regimen.  Patient has home assistance with dosing her antihypertensive medication.  EKG today reveals sinus rhythm with no abnormality.  Patient denied chest pain or shortness of breath.  Hypertension This is a chronic problem. The current episode started more than 1 month ago. The problem is controlled. Associated symptoms include anxiety. Pertinent negatives include no blurred vision, chest pain, headaches, malaise/fatigue, neck pain, orthopnea, palpitations, peripheral edema, PND, shortness of breath or sweats. There are no associated agents to hypertension. Risk factors for coronary artery disease include stress and post-menopausal state. Past treatments include diuretics, direct vasodilators, calcium channel blockers, beta blockers and ACE inhibitors. The current treatment provides moderate improvement. Hypertensive end-organ damage includes left ventricular hypertrophy. There is no history of angina, kidney disease, CAD/MI, CVA, heart failure, PVD or retinopathy. There is no history of chronic renal disease, a hypertension causing med, sleep apnea or a thyroid problem.    Reviewed and updated this visit by provider: Tobacco  Allergies  Meds  Problems  Med Hx  Surg Hx  Fam Hx       Review of Systems  Constitutional: Negative for activity change and malaise/fatigue.  HENT: Positive for nosebleeds.   Eyes: Negative for blurred vision and visual  disturbance.  Respiratory: Negative for chest tightness and shortness of breath.   Cardiovascular: Negative for chest pain, palpitations, orthopnea, leg swelling and PND.  Gastrointestinal: Negative.   Genitourinary: Negative.   Musculoskeletal: Negative for neck pain.  Neurological: Negative for dizziness, speech difficulty, weakness and headaches.     Objective:   Vitals:   11/29/19 1345  BP: 142/68  Pulse: 76  Height: 5' 3 (1.6 m)  Weight: 171 lb (77.6 kg)  SpO2: 97%  BMI (Calculated): 30.3  PainSc: 0-No pain   Physical Exam Constitutional:      Appearance: She is not ill-appearing or diaphoretic.  HENT:     Head: Atraumatic.     Nose: No congestion.  Eyes:     Extraocular Movements: Extraocular movements intact.     Pupils: Pupils are equal, round, and reactive to light.  Neck:     Vascular: No carotid bruit.  Cardiovascular:     Rate and Rhythm: Normal rate and regular rhythm.     Heart sounds: No murmur. No gallop.   Musculoskeletal:        General: Normal range of motion.     Cervical back: Normal range of motion.  Pulmonary:     Effort: No respiratory distress.     Breath sounds: No wheezing or rales.  Abdominal:     General: Abdomen is flat. There is no distension.     Tenderness: There is abdominal tenderness.  Skin:    General: Skin is warm.  Neurological:     General: No focal deficit present.     Mental Status: She is alert and oriented to person, place, and time.     Cranial Nerves: No cranial nerve deficit.  Motor: No weakness.    ECG 12 lead  Result Date: 11/29/2019 Sinus  Rhythm -Left atrial enlargement.      Assessment / Plan:   Assessment 1. Palpitations (Primary) -     ECG 12 lead -     HOLTER MONITOR UP TO 48 HRS RECORDING ONLY; Future -     HOLTER MONITOR UP TO 48 HRS INTERP ONLY; Future -     Echocardiogram Complete WO Enhancing Agent; Future 2. Dyspnea, unspecified type  -     Echocardiogram Complete WO Enhancing Agent;  Future 3. Hypertension associated with diabetes (*)    Plan 1. 48 hour Holter 2. Echocardiogram 3. No medication changes this visit.  Blood pressure goal less than 140/90 4. Return in 6 weeks 5.  Recommend neurology referral for further management of Alzheimer's disease 6. Lab work today    Patient's Medications       Accurate as of November 29, 2019  2:49 PM. Reflects encounter med changes as of last refresh        Continued Medications     Instructions  ALDACTONE 100 MG tablet Generic drug: spironolactone  25 mg, Oral, Daily   amLODIPine  besylate 2.5 mg tablet Commonly known as: NORVASC   2.5 mg, Oral, Daily   buPROPion  75 mg tablet Commonly known as: WELLBUTRIN   75 mg, Oral, 2 times a day   hydrALAZINE HCl 100 mg tablet Commonly known as: APRESOLINE  100 mg, Oral, 3 times daily   lisinopril  40 mg tablet Commonly known as: PRINIVIL ,ZESTRIL   40 mg, Oral, Daily   memantine  5 mg tablet Commonly known as: NAMENDA   5 mg, Oral, 2 times a day, Take 7 mg daily    metoprolol  tartrate 100 mg tablet Commonly known as: LOPRESSOR   Oral, 2 times a day   potassium chloride 8 mEq CR tablet Commonly known as: K+8,KLOR CON  8 mEq, Oral, Daily   sitaGLIPtin 100 mg tablet Commonly known as: JANUVIA  Oral         Risks, benefits, and alternatives of the medications and treatment plan prescribed today were discussed, and patient expressed understanding. Plan follow-up as discussed or as needed if any worsening symptoms or change in condition.

## 2019-12-09 NOTE — Progress Notes (Signed)
  [facility]) [address] [place] [address] [phone]    Date of Service: 12/09/2019  Patient Name: Theresa Mendez      MRN: [id number]      Date of Birth: [birth year] Primary Care Physician: No primary care provider on file. Referring Provider: Karlene Pastor, MD  Chief Complaint  Patient presents with  . Back Pain  . Extremity Weakness    ASSESSMENT AND PLAN: 74 y.o. female presenting for initial neurosurgical evaluation secondary to chronic low back pain with bilateral gluteal pain in a distribution most consistent with an L5 radiculopathy.  L-spine MRI dated 02/2018 is remarkable for grade 1 spondylolisthesis at L3/4, L4/5 secondary to degenerative disc changes and posterior hypertrophic changes and contributing to moderate-severe central canal stenosis at L3/4, L4/5 and moderate bilateral neuroforaminal stenosis at L4/5.  I have discussed the above with both patient and her sister, reviewing the L-spine MRI images and discussing the results in depth with them.  I recommend proceeding with an updated L-spine MRI to assess for neural impingement and also L-spine AP/lateral, flexion/extension x-rays given grade 1 spondylolisthesis and to assess for spinal instability, if any.  I have recommended continuing with physical therapy for core/lumbar and bilateral leg strengthening as PT is lifelong and indicated in the treatment of low back pain.  I have also advised against performing strenuous activities to include bending/twisting at waist and lifting >25lbs. they are both agreeable to the above, and I will contact the patient's sister with results of L-spine x-rays once obtained.  Our office will contact him for an appointment to review L-spine MRI images, also once obtained.  I have answered all of their questions to the best of my abilities, and he may contact the office for any additional questions/concerns.  I spent 45 minutes  face-to-face with the patient and greater than 50% of that time was spent on counseling, medical decision making including visualization and interpretation of imaging, and coordinating care.  HISTORY OF PRESENT ILLNESS: 74 y.o. female presenting for an initial neurosurgical evaluation, at the kind request of Karlene Pastor, MD secondary to chronic low back pain with radiating bilateral gluteal pain.  Onset of symptoms have been ongoing for approximately 20 years and without any inciting causes nor trauma she recalls.  She denies worsening in symptoms, but her pain has been ongoing and has been particularly bothersome for her.  She was evaluated by pain management specialist in 2019, Dr. Ambrosio, and an L-spine MRI was obtained which was remarkable for grade 1 spondylolisthesis and spinal stenosis.  She obtained an injection [unsure of what type] during that time, which she mentions was minimally beneficial.  A referral was placed to our office by Dr. Asenso for further neurosurgical evaluation.  The back pain is associated with bilateral gluteal region pain and without bladder incontinence, bowel incontinence, and with gait disturbance.  Her sister is present with her today at this visit, and helps contribute to the history.  The pain radiates from the low back and into the bilateral gluteal region, and denying radiating pain into the legs.  She describes a very active lifestyle in the past, participating in more than dance, but pain has prevented her from continuing with these activities.  She also reports a catch in her back sometimes and very unexpectedly, which stops her in her steps.  Sitting and standing for prolonged periods exacerbates the pain, and she is unable to find a comfortable position when sleeping.  Leaning forward seems to alleviate the pain.  She resides at [employer] living in [place], and is currently participating in physical therapy.  She denies any recent falls,  and has intermittently required the use of a single-point cane over the last 5 years.  History reviewed. No pertinent past medical history. History reviewed. No pertinent surgical history. Family History  Problem Relation Age of Onset  . Stroke Paternal Grandfather    Social History   Tobacco Use  Smoking Status Former Smoker  . Packs/day: 0.25  . Types: Cigarettes  . Quit date: 06/13/2006  . Years since quitting: 13.4  Smokeless Tobacco Never Used   Allergies  Allergen Reactions  . Simvastatin Other    Current Outpatient Medications  Medication Sig Dispense Refill  . amLODIPine  besylate (NORVASC ) 2.5 mg tablet Take 2.5 mg by mouth daily.    . buPROPion  (WELLBUTRIN ) 75 mg tablet Take 75 mg by mouth 2 (two) times daily.    . hydrALAZINE HCl (APRESOLINE) 100 mg tablet Take 100 mg by mouth 3 (three) times a day.    . lisinopril  (PRINIVIL ,ZESTRIL ) 40 mg tablet Take 40 mg by mouth daily.    . memantine  (NAMENDA ) 5 mg tablet Take 5 mg by mouth 2 (two) times daily. Take 7 mg daily    . metoprolol  tartrate (LOPRESSOR ) 100 mg tablet Take by mouth 2 (two) times daily.    . potassium chloride (K+8,KLOR CON) 8 mEq CR tablet Take 8 mEq by mouth daily.    . sitaGLIPtin (JANUVIA) 100 mg tablet Take by mouth.    . spironolactone (ALDACTONE) 100 MG tablet Take 25 mg by mouth daily.     No current facility-administered medications for this visit.    Review of Systems The following systems are noncontributory including constitutional, eyes, ears, nose, mouth, throat, cardiovascular, respiratory, gastrointestinal, genitourinary, integumentary, neurological, psychiatric, endocrine, hematologic/lymphatic, and allergic/immunologic. Musculoskeletal positive for presenting condition as described in HPI.  PHYSICAL EXAM: Blood pressure 138/65, pulse 70, temperature 97.8 F (36.6 C), temperature source Temporal, height 5' 3 (1.6 m).  Description:  African American female Body mass index is 30.29  kg/m.  Mental Status: Alert, oriented, thought content appropriate  Skin: Normal  HEENT Exam:   Head: Normocephalic, no lesions, without obvious abnormality. Eye: Normal external eye, conjunctiva, lids cornea, PERL. Nose: Normal external nose, mucus membranes and septum.  Musculoskeletal Exam:  Lumbar Spine: decreased ROM and palpable pain; Lumbar ROM: normal in flexion decreased secondary to pain in extension  Motor Exam:  Right hip flexor  4+/5 Left hip flexor  4+/5 Right quads  5/5 Left quads  5/5 Right anterior tibialis  5/5 Left anterior tibialis  5/5 Right peroneal  5/5 Left peroneal  5/5  Sensory Exam: intact to light touch BLEs  Gait: Slow; assisted with single-point cane  VISIT DIAGNOSES:  Lumbar radiculopathy [M54.16]  1. Lumbar radiculopathy  MRI Spine  Lumbar WO IV Contrast  2. Spinal stenosis, lumbar region without neurogenic claudication  MRI Spine  Lumbar WO IV Contrast  3. Spondylolisthesis, lumbar region  MRI Spine  Lumbar WO IV Contrast   XR Spine Lumbar Min 4 Views  4. Disc degeneration, lumbar  MRI Spine  Lumbar WO IV Contrast  5. Chronic bilateral low back pain with bilateral sciatica  MRI Spine  Lumbar WO IV Contrast   XR Spine Lumbar Min 4 Views    ORDERS FOR THIS VISIT: Requested Prescriptions    No prescriptions requested or ordered in this encounter  Orders Placed This Encounter  Procedures  . MRI Spine  Lumbar WO IV Contrast  . XR Spine Lumbar Min 4 Views    As always I do thank you for allowing me to be involved in your patient's care and will continue to keep you informed as to their status.  Omobolawa Olagoke, DNP, NP-C

## 2019-12-12 ENCOUNTER — Other Ambulatory Visit: Payer: Self-pay

## 2019-12-12 ENCOUNTER — Ambulatory Visit (INDEPENDENT_AMBULATORY_CARE_PROVIDER_SITE_OTHER): Payer: Medicare Other | Admitting: Otolaryngology

## 2019-12-12 ENCOUNTER — Encounter (INDEPENDENT_AMBULATORY_CARE_PROVIDER_SITE_OTHER): Payer: Self-pay | Admitting: Otolaryngology

## 2019-12-12 VITALS — Temp 97.3°F

## 2019-12-12 DIAGNOSIS — H6123 Impacted cerumen, bilateral: Secondary | ICD-10-CM | POA: Diagnosis not present

## 2019-12-12 NOTE — Progress Notes (Signed)
HPI: Theresa Mendez is a 74 y.o. female who presents for evaluation of ear wax.  She is brought here by her sister who we have previously clean wax from.  Coti does not complain of any problems..  Past Medical History:  Diagnosis Date  . Dementia (HCC)   . Diabetes mellitus without complication (HCC)   . Hypertension    Past Surgical History:  Procedure Laterality Date  . ABDOMINAL HYSTERECTOMY    . COLON SURGERY     Social History   Socioeconomic History  . Marital status: Married    Spouse name: Not on file  . Number of children: Not on file  . Years of education: Not on file  . Highest education level: Not on file  Occupational History  . Not on file  Tobacco Use  . Smoking status: Former Smoker    Packs/day: 0.50    Years: 8.00    Pack years: 4.00  . Smokeless tobacco: Never Used  Substance and Sexual Activity  . Alcohol use: No  . Drug use: No  . Sexual activity: Not on file  Other Topics Concern  . Not on file  Social History Narrative  . Not on file   Social Determinants of Health   Financial Resource Strain:   . Difficulty of Paying Living Expenses:   Food Insecurity:   . Worried About Programme researcher, broadcasting/film/video in the Last Year:   . Barista in the Last Year:   Transportation Needs:   . Freight forwarder (Medical):   Marland Kitchen Lack of Transportation (Non-Medical):   Physical Activity:   . Days of Exercise per Week:   . Minutes of Exercise per Session:   Stress:   . Feeling of Stress :   Social Connections:   . Frequency of Communication with Friends and Family:   . Frequency of Social Gatherings with Friends and Family:   . Attends Religious Services:   . Active Member of Clubs or Organizations:   . Attends Banker Meetings:   Marland Kitchen Marital Status:    No family history on file. No Known Allergies Prior to Admission medications   Medication Sig Start Date End Date Taking? Authorizing Provider  atorvastatin (LIPITOR) 10 MG tablet Take 10  mg by mouth daily.    [provider]  clopidogrel (PLAVIX) 300 MG TABS tablet Take 300 mg by mouth once.    [provider]  donepezil (ARICEPT) 10 MG tablet Take 10 mg by mouth at bedtime.    [provider]  indomethacin (INDOCIN) 25 MG capsule Take 1 capsule (25 mg total) by mouth 2 (two) times daily with a meal. As needed for pain 10/21/13   Presson, Jess Barters H, PA  lisinopril (PRINIVIL,ZESTRIL) 40 MG tablet Take 40 mg by mouth daily.    [provider]  metFORMIN (GLUCOPHAGE) 500 MG tablet Take by mouth 2 (two) times daily with a meal.    [provider]     Positive ROS: Otherwise negative  All other systems have been reviewed and were otherwise negative with the exception of those mentioned in the HPI and as above.  Physical Exam: Constitutional: Alert, well-appearing, no acute distress Ears: External ears without lesions or tenderness. Ear canals large amount of obstructing wax on the right side minimal wax on the left side.  This was cleaned with forceps and and curette.  TMs were clear bilaterally.. Nasal: External nose without lesions. Clear nasal passages Oral: Oropharynx clear.  Neck: No palpable adenopathy or masses Respiratory: Breathing comfortably  Skin: No facial/neck lesions or rash noted.  Cerumen impaction removal  Date/Time: 12/12/2019 2:43 PM Performed by: Drema Halon, MD Authorized by: Drema Halon, MD   Consent:    Consent obtained:  Verbal   Consent given by:  Patient   Risks discussed:  Pain and bleeding Procedure details:    Location:  L ear and R ear   Procedure type: curette and forceps   Post-procedure details:    Inspection:  TM intact and canal normal   Hearing quality:  Improved   Patient tolerance of procedure:  Tolerated well, no immediate complications Comments:     TMs are clear bilaterally.    Assessment: Cerumen buildup much worse on the right side.  Plan: Ear  canals were cleaned in the office today.  TMs were clear bilaterally  Narda Bonds, MD

## 2020-01-20 NOTE — Progress Notes (Signed)
 Subjective:    Patient ID: Theresa Mendez is a 74 y.o. (DOB 1945-11-25) female.     Patient presents with  . Heart Palpitations     Theresa Mendez presents for follow up of recent studies. No cardiac symptoms or events since last visit. B/P controlled this visit.    Echocardiogram 12/2019 .  Left Ventricle: Normal left ventricle. There is borderline hypertrophy. Systolic function is normal. EF: 60-65%. No regional wall motion abnormalities noted. Doppler parameters indicate normal diastolic function. .  Left Atrium: Left atrium is normal in size. .  Right Ventricle: Right ventricle appears normal. Normal tricuspid annular plane systolic excursion (TAPSE) >1.7 cm. .  Aortic Valve: The aortic valve is tricuspid. The leaflets are mildly thickened and exhibit normal excursion. There is no regurgitation or stenosis. .  Mitral Valve: Mitral valve structure is normal. There is no mitral regurgitation. .  Tricuspid Valve: Tricuspid valve structure is normal. There is mild regurgitation.  48 hour Holter 11/2019 Patient monitored for 48 hours. Analysis and rhythm strips revealed sinus rhythm at an average rate of 73 bpm. Minimum heart rate 55 bpm. Maximum heart rate 96 bpm. No atrial fibrillation noted. There were rare premature atrial contractions. There were no pauses greater than 3 seconds in duration.    11/2019- Theresa Mendez presents today for cardiology evaluation of palpitations.  History of hypertension and Alzheimer's disease.  History of stroke in the remote past.  [place] .  She was accompanied by her sister on this visit who assisted with the history.  Palpitations are described as an irregular rhythm.  No history of atrial fibrillation.  Hypertension uncontrolled on a 4 drug medical regimen.  Patient has home assistance with dosing her antihypertensive medication.  EKG today reveals sinus rhythm with no abnormality.  Patient denied chest pain or  shortness of breath.  Hypertension This is a chronic problem. The current episode started more than 1 month ago. The problem is controlled. Associated symptoms include anxiety. Pertinent negatives include no blurred vision, malaise/fatigue, orthopnea, palpitations, peripheral edema, PND, shortness of breath or sweats. There are no associated agents to hypertension. Risk factors for coronary artery disease include stress and post-menopausal state. Past treatments include diuretics, direct vasodilators, calcium channel blockers, beta blockers and ACE inhibitors. The current treatment provides moderate improvement. Hypertensive end-organ damage includes left ventricular hypertrophy. There is no history of angina, kidney disease, CAD/MI, CVA, heart failure, PVD or retinopathy. There is no history of chronic renal disease, a hypertension causing med, sleep apnea or a thyroid problem.    Reviewed and updated this visit by provider: Tobacco  Allergies  Meds  Problems  Med Hx  Surg Hx  Fam Hx       Review of Systems  Constitutional: Negative for activity change and malaise/fatigue.  HENT: Positive for nosebleeds.   Eyes: Negative for blurred vision and visual disturbance.  Respiratory: Negative for chest tightness and shortness of breath.   Cardiovascular: Negative for palpitations, orthopnea, leg swelling and PND.  Gastrointestinal: Negative.   Genitourinary: Negative.   Neurological: Negative for dizziness and speech difficulty.      Objective:   Vitals:   01/20/20 1047  BP: 136/70  Pulse: 56  Height: 5' 3 (1.6 m)  Weight: 177 lb 6.4 oz (80.5 kg)  SpO2: 98%  BMI (Calculated): 31.4  PainSc: 0-No pain   Physical Exam Constitutional:      Appearance: She is not ill-appearing or diaphoretic.  HENT:  Head: Atraumatic.     Nose: No congestion.  Eyes:     Extraocular Movements: Extraocular movements intact.     Pupils: Pupils are equal, round, and reactive to light.  Neck:      Vascular: No carotid bruit.  Cardiovascular:     Rate and Rhythm: Normal rate and regular rhythm.     Heart sounds: No murmur heard.  No gallop.   Musculoskeletal:        General: Normal range of motion.     Cervical back: Normal range of motion.  Pulmonary:     Effort: No respiratory distress.     Breath sounds: No wheezing or rales.  Abdominal:     General: Abdomen is flat. There is no distension.     Tenderness: There is abdominal tenderness.  Skin:    General: Skin is warm.  Neurological:     General: No focal deficit present.     Mental Status: She is alert and oriented to person, place, and time.     Cranial Nerves: No cranial nerve deficit.     Motor: No weakness.    ECG 12 lead  Result Date: 11/29/2019 Sinus  Rhythm -Left atrial enlargement.      Assessment / Plan:   Assessment 1. Palpitations (Primary) 2. Hypertension associated with diabetes (*)    Plan 1. No medication changes this visit. B/P goal < 130/80 2. No further cardiac studies at this time 3. Return in 12 months     Patient's Medications       Accurate as of January 20, 2020 11:07 AM. Reflects encounter med changes as of last refresh        Continued Medications     Instructions  ALDACTONE 100 MG tablet Generic drug: spironolactone  25 mg, Oral, Daily   amLODIPine  besylate 2.5 mg tablet Commonly known as: NORVASC   2.5 mg, Oral, Daily   buPROPion  75 mg tablet Commonly known as: WELLBUTRIN   75 mg, Oral, 2 times a day   hydrALAZINE HCl 100 mg tablet Commonly known as: APRESOLINE  100 mg, Oral, 3 times daily   lisinopril  40 mg tablet Commonly known as: PRINIVIL ,ZESTRIL   40 mg, Oral, Daily   memantine  5 mg tablet Commonly known as: NAMENDA   5 mg, Oral, 2 times a day, Take 7 mg daily    metoprolol  tartrate 100 mg tablet Commonly known as: LOPRESSOR   Oral, 2 times a day   potassium chloride 8 mEq CR tablet Commonly known as: K+8,KLOR CON  8 mEq, Oral, Daily    sitaGLIPtin 100 mg tablet Commonly known as: JANUVIA  Oral         Risks, benefits, and alternatives of the medications and treatment plan prescribed today were discussed, and patient expressed understanding. Plan follow-up as discussed or as needed if any worsening symptoms or change in condition.

## 2020-01-28 NOTE — Progress Notes (Signed)
 [facility] AND SPINE SURGERY ([place]) [address] [place] [address] [phone]   Date of Service: 01/28/2020  Patient Name: Theresa Mendez      MRN: [id number]      Date of Birth: [birth year] Primary Care Physician: Manus Gasmen, MD Referring Provider: Gasmen Manus, MD           Chief Complaint  Patient presents with  . Follow-up  . Back Pain  . Buttocks Pain    ASSESSMENT AND PLAN: 74 y.o. female returning for follow-up neurosurgical evaluation having undergone recent lumbar MRI which confirms spinal stenosis most prominent at L4/5 where there is associated grade I spondylolisthesis, facet arthropathy, and disc degeneration.  The patient has experienced low back pain for the last 20 years and currently resides in an independent living facility secondary to underlying Alzheimer's disease.  Over the last month she has been participating in a course of PT at the facility and has noted improvement in her back pain.  She was previously residing in a facility in [place] and recalls being on a medication for her back pain, verbalizing she recalls utilizing gabapentin years ago and more recently an analgesic cream.  Review of her medications from the current facility reveal that she is currently not on any medications for back pain, and I subsequently did provide her with a prescription for duloxetine to trial.   We briefly discussed the potential for also undergoing epidural steroid injections, but given she is somewhat better with the current PT and recalls not receiving significant relief from epidurals in the past we will hold on this intervention for now.  Her sister whom accompanies her will notify us  via MyChart with any medication updates.  For now she can continue to follow-up with us  prn as I do not believe she requires lumbar fusion at this time.  HISTORY OF PRESENT ILLNESS: 74 y.o. female initially seen in consultation at the request of  Gasmen Manus, MD, presenting for a follow-up neurosurgical evaluation secondary to a longstanding h/o LBP (dating back 20 years) with associated bilateral gluteal pain, denying any associated radiating leg pain, numbness, or tingling.  She says her pain is mostly localized in the tailbone region.  She moved to [facility] from [place] in April/May of this year and recently presented to our office to establish care due to the chronicity of the back pain.  Since last seeing us  she has undergone an updated lumbar MRI and returns today to discuss the results.  She has been participating in PT at [facility] for the last month which she says has helped with her back pain.  She is accompanied by her sister who indicates she has not noticed the patient complaining of the catch in her back like previously. Previously she has undergone lumbar epidural steroid injections approximately 4-5 years ago and more recently has undergone PRP (platelet rich plasma) injections by Dr. Ambrosio.  MEDICAL DECISION MAKING: L-spine MRI dated  History reviewed. No pertinent past medical history. History reviewed. No pertinent surgical history. Family History  Problem Relation Age of Onset  . Stroke Paternal Grandfather    Social History   Tobacco Use  Smoking Status Former Smoker  . Packs/day: 0.25  . Types: Cigarettes  . Quit date: 06/13/2006  . Years since quitting: 13.6  Smokeless Tobacco Never Used   Allergies  Allergen Reactions  . Simvastatin Other    Current Outpatient Medications  Medication Sig Dispense Refill  . amLODIPine  besylate (NORVASC )  2.5 mg tablet Take 2.5 mg by mouth daily.    . buPROPion  (WELLBUTRIN ) 75 mg tablet Take 75 mg by mouth 2 (two) times daily.    . hydrALAZINE HCl (APRESOLINE) 100 mg tablet Take 100 mg by mouth 3 (three) times a day.    . lisinopril  (PRINIVIL ,ZESTRIL ) 40 mg tablet Take 40 mg by mouth daily.    . memantine  (NAMENDA ) 5 mg tablet Take 5 mg by mouth 2 (two) times  daily. Take 7 mg daily    . metoprolol  tartrate (LOPRESSOR ) 100 mg tablet Take by mouth 2 (two) times daily.    . potassium chloride (K+8,KLOR CON) 8 mEq CR tablet Take 8 mEq by mouth daily.    . sitaGLIPtin (JANUVIA) 100 mg tablet Take by mouth.    . spironolactone (ALDACTONE) 100 MG tablet Take 25 mg by mouth daily.    . DULoxetine HCl (CYMBALTA) 30 mg capsule Take one capsule (30 mg dose) by mouth daily. 30 capsule 0   No current facility-administered medications for this visit.    Review of Systems The following systems are noncontributory including constitutional, eyes, ears, nose, mouth, throat, cardiovascular, respiratory, gastrointestinal, genitourinary, integumentary, neurological, psychiatric, endocrine, hematologic/lymphatic, and allergic/immunologic. Musculoskeletal positive for presenting condition as described in HPI.  Physical Exam  Blood pressure (!) 150/91, pulse 66, temperature 97 F (36.1 C), temperature source Temporal.  Description: African American female There is no height or weight on file to calculate BMI.  Mental Status: Alert, oriented, thought content appropriate  Skin: Normal  HEENT Exam:  Head: Normocephalic, no lesions, without obvious abnormality. Eye: Normal external eye, conjunctiva, lids cornea, PERL. Nose: Normal external nose, mucus membranes and septum.  Motor Exam:  Right hip flexor  5/5 Left hip flexor  5/5 Right quads  5/5 Left quads  5/5 Right anterior tibialis  5/5 Left anterior tibialis  5/5 Right peroneal  5/5 Left peroneal  5/5  Sensory Exam: intact to light touch BLEs with some tenderness to palpation just above the right ankle  Gait: Slow and Stiff  VISIT DIAGNOSES: Lumbar radiculopathy [M54.16]  1. Lumbar radiculopathy    2. Lumbar stenosis with neurogenic claudication    3. Disc degeneration, lumbar    4. Spondylolisthesis of lumbar region    5. Lumbar spondylosis     ORDERS FOR THIS VISIT: Requested  Prescriptions   Signed Prescriptions Disp Refills  . DULoxetine HCl (CYMBALTA) 30 mg capsule 30 capsule 0    Sig: Take one capsule (30 mg dose) by mouth daily.   No orders of the defined types were placed in this encounter.  As always I do thank you for allowing me to be involved in your patient's care and will continue to keep you informed as to their status. Lin Civatte, MD

## 2020-02-27 ENCOUNTER — Other Ambulatory Visit: Payer: Self-pay

## 2020-02-27 ENCOUNTER — Encounter: Payer: Self-pay | Admitting: Neurology

## 2020-02-27 ENCOUNTER — Ambulatory Visit (INDEPENDENT_AMBULATORY_CARE_PROVIDER_SITE_OTHER): Payer: Medicare Other | Admitting: Neurology

## 2020-02-27 VITALS — BP 130/80 | HR 70 | Ht 64.0 in | Wt 182.4 lb

## 2020-02-27 DIAGNOSIS — G301 Alzheimer's disease with late onset: Secondary | ICD-10-CM | POA: Diagnosis not present

## 2020-02-27 DIAGNOSIS — F0280 Dementia in other diseases classified elsewhere without behavioral disturbance: Secondary | ICD-10-CM | POA: Diagnosis not present

## 2020-02-27 MED ORDER — MEMANTINE HCL ER 14 MG PO CP24: 14.0000 mg | ORAL_CAPSULE | Freq: Every day | ORAL | 3 refills | Status: DC

## 2020-02-27 MED ORDER — DONEPEZIL HCL 10 MG PO TABS: 10.0000 mg | ORAL_TABLET | Freq: Every day | ORAL | 3 refills | Status: DC

## 2020-02-27 NOTE — Patient Instructions (Signed)
Good to meet you!  1. Refills for Donepezil 10mg  daily and Memantine ER 14mg  daily have been sent to your pharmacy  2. Continue 24/7 care  3. Follow-up in 6 months, call for any changes   FALL PRECAUTIONS: Be cautious when walking. Scan the area for obstacles that may increase the risk of trips and falls. When getting up in the mornings, sit up at the edge of the bed for a few minutes before getting out of bed. Consider elevating the bed at the head end to avoid drop of blood pressure when getting up. Walk always in a well-lit room (use night lights in the walls). Avoid area rugs or power cords from appliances in the middle of the walkways. Use a walker or a cane if necessary and consider physical therapy for balance exercise. Get your eyesight checked regularly.   RECOMMENDATIONS FOR ALL PATIENTS WITH MEMORY PROBLEMS: 1. Continue to exercise (Recommend 30 minutes of walking everyday, or 3 hours every week) 2. Increase social interactions - continue going to [place] and enjoy social gatherings with friends and family 3. Eat healthy, avoid fried foods and eat more fruits and vegetables 4. Maintain adequate blood pressure, blood sugar, and blood cholesterol level. Reducing the risk of stroke and cardiovascular disease also helps promoting better memory. 5. Avoid stressful situations. Live a simple life and avoid aggravations. Organize your time and prepare for the next day in anticipation. 6. Sleep well, avoid any interruptions of sleep and avoid any distractions in the bedroom that may interfere with adequate sleep quality 7. Avoid sugar, avoid sweets as there is a strong link between excessive sugar intake, diabetes, and cognitive impairment The Mediterranean diet has been shown to help patients reduce the risk of progressive memory disorders and reduces cardiovascular risk. This includes eating fish, eat fruits and green leafy vegetables, nuts like almonds and hazelnuts, walnuts, and also use  olive oil. Avoid fast foods and fried foods as much as possible. Avoid sweets and sugar as sugar use has been linked to worsening of memory function.  There is always a concern of gradual progression of memory problems. If this is the case, then we may need to adjust level of care according to patient needs. Support, both to the patient and caregiver, should then be put into place.

## 2020-02-27 NOTE — Progress Notes (Signed)
NEUROLOGY CONSULTATION NOTE  Theresa Mendez MRN: [id number] DOB: Oct 25, 1945  Referring provider: Dr. Annita Brod Primary care provider: Dr. Annita Brod  Reason for consult:  dementia  Dear Dr Darleene Cleaver:  Thank you for your kind referral of Theresa Mendez for consultation of the above symptoms. Although her history is well known to you, please allow me to reiterate it for the purpose of our medical record. The patient was accompanied to the clinic by her sister and POA [name] Som-Pimpong who also provides collateral information. Records and images were personally reviewed where available.   HISTORY OF PRESENT ILLNESS: This is a 74 year old left-handed woman with a history of hypertension, hyperlipidemia, diabetes, dementia, presenting to establish care. She was previously living in [place] and moved to [facility] to be closer to her sister and POA. She states her memory is not good. Records were reviewed. Records indicate that memory changes started in 2014, she tended to be repetitive.  Prior Neuropsychological testing in 2014 and 2016 indicated an amnestic profile suggestive of Alzheimer's disease. MRI brain in 2014 showed significant frontal, parietal, and temporal atrophy. No significant vascular changes. MOCA score 15/30 in 08/2017 on her last visit with her neurologist. She is on Donepezil 10mg  daily and Memantine ER 14mg  daily without side effects. She states her husband is still living in their house (he is deceased). 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 Medications are administered at [facility]. She asks "where is 11-26-1976?" Her older sister with dementia also resides there. Her POA manages finances. She does not drive. She states she is independent with dressing and bathing, [facility] reminds her that she is assisted with this. She goes to the dining hall for meals. She  states sleep is good. She denies any headaches, dizziness, diplopia, dysarthria/dysphagia, focal numbness/tingling/weakness, bowel/bladder dysfunction, anosmia, or tremors. She has chronic back pain and is doing physical therapy, although she does not recall this and her sister reminds her she has it several times a week. No personality changes, paranoia. She was previously on lorazepam but has not needed it. Sometimes she has mentioned seeing snakes, but not regularly. There is a strong family history of dementia in her mother, maternal grandmother, older sister. She does not drink alcohol.   PAST MEDICAL HISTORY: Past Medical History:  Diagnosis Date  . Dementia (HCC)   . Diabetes mellitus without complication (HCC)   . Hypertension     PAST SURGICAL HISTORY: Past Surgical History:  Procedure Laterality Date  . ABDOMINAL HYSTERECTOMY    . COLON SURGERY      MEDICATIONS: Current Outpatient Medications on File Prior to Visit  Medication Sig Dispense Refill  . acetaminophen (TYLENOL) 325 MG tablet Take 650 mg by mouth every 6 (six) hours as needed.    [employer] amLODipine (NORVASC) 2.5 MG tablet Take by mouth.    [name] buPROPion (WELLBUTRIN) 75 MG tablet Take 75 mg by mouth daily.    Marland Kitchen docusate sodium (COLACE) 100 MG capsule Take 100 mg by mouth daily.    Marland Kitchen donepezil (ARICEPT) 10 MG tablet Take 10 mg by mouth at bedtime.    . hydrALAZINE (APRESOLINE) 100 MG tablet Take 100 mg by mouth 3 (three) times daily.    Marland Kitchen lisinopril (PRINIVIL,ZESTRIL) 40 MG tablet Take 40 mg by mouth daily.    Marland Kitchen MEMANTINE HCL PO Take 14 mg by mouth.     Marland Kitchen  metoprolol tartrate (LOPRESSOR) 100 MG tablet Take 100 mg by mouth daily.    . potassium chloride (KLOR-CON) 8 MEQ tablet Take by mouth.    . sitaGLIPtin (JANUVIA) 100 MG tablet Take by mouth.    . spironolactone (ALDACTONE) 25 MG tablet Take 0.5 tablets by mouth daily.     No current facility-administered medications on file prior to visit.    ALLERGIES: No Known  Allergies  FAMILY HISTORY: Family History  Problem Relation Age of Onset  . Dementia Mother   . Dementia Sister   . Dementia Maternal Grandmother     SOCIAL HISTORY: Social History   Socioeconomic History  . Marital status: Married    Spouse name: Not on file  . Number of children: Not on file  . Years of education: Not on file  . Highest education level: Not on file  Occupational History  . Not on file  Tobacco Use  . Smoking status: Former Smoker    Packs/day: 0.50    Years: 8.00    Pack years: 4.00  . Smokeless tobacco: Never Used  Vaping Use  . Vaping Use: Never used  Substance and Sexual Activity  . Alcohol use: No  . Drug use: No  . Sexual activity: Not on file  Other Topics Concern  . Not on file  Social History Narrative   Right handed    Lives at [facility]   Has a husband    Social Determinants of Health   Financial Resource Strain:   . Difficulty of Paying Living Expenses: Not on file  Food Insecurity:   . Worried About Programme researcher, broadcasting/film/video in the Last Year: Not on file  . Ran Out of Food in the Last Year: Not on file  Transportation Needs:   . Lack of Transportation (Medical): Not on file  . Lack of Transportation (Non-Medical): Not on file  Physical Activity:   . Days of Exercise per Week: Not on file  . Minutes of Exercise per Session: Not on file  Stress:   . Feeling of Stress : Not on file  Social Connections:   . Frequency of Communication with Friends and Family: Not on file  . Frequency of Social Gatherings with Friends and Family: Not on file  . Attends Religious Services: Not on file  . Active Member of Clubs or Organizations: Not on file  . Attends Banker Meetings: Not on file  . Marital Status: Not on file  Intimate Partner Violence:   . Fear of Current or Ex-Partner: Not on file  . Emotionally Abused: Not on file  . Physically Abused: Not on file  . Sexually Abused: Not on file     PHYSICAL EXAM: Vitals:    02/27/20 1019  BP: 130/80  Pulse: 70  SpO2: 99%   General: No acute distress Head:  Normocephalic/atraumatic Skin/Extremities: No rash, no edema Neurological Exam: Mental status: alert and oriented to person, state. Year is 2016.No dysarthria or aphasia, fluent. Fund of knowledge is reduced.  Recent and remote memory are impaired.  Attention and concentration are normal.   SLUMS score 11/30.  [employer] Mental Exam 02/27/2020  Weekday Correct 0  Current year 0  What state are we in? 1  Amount spent 0  Amount left 0  # of Animals 1  5 objects recall 0  Number series 2  Hour markers 1  Time correct 0  Placed X in triangle correctly 1  Largest Figure 1  Name  of female 2  Date back to work 2  Type of work [employer] she lived in 0  Total score 11   Cranial nerves: CN I: not tested CN II: pupils equal, round and reactive to light, visual fields intact CN III, IV, VI:  full range of motion, no nystagmus, no ptosis CN V: facial sensation intact CN VII: upper and lower face symmetric CN VIII: hearing intact to conversation CN IX, X: gag intact, uvula midline CN XI: sternocleidomastoid and trapezius muscles intact CN XII: tongue midline Bulk & Tone: normal, no fasciculations. Motor: 5/5 throughout with no pronator drift. Sensation: intact to light touch, cold, pin, vibration sense.  No extinction to double simultaneous stimulation.  Romberg test negative Deep Tendon Reflexes: +2 throughout Cerebellar: no incoordination on finger to nose testing Gait: narrow-based and steady, no ataxia Tremor: none   IMPRESSION: This is a 74 year old left-handed woman with a history of hypertension, hyperlipidemia, diabetes, Alzheimer's disease, presenting to establish care. Neurological exam is non-focal, SLUMS score today 11/30. Records indicate Neuropsychological testing in 2014 and 2016 indicating amnestic profile, consistent with AD. MRI brain in 2014 showed significant frontal,  parietal, and temporal atrophy. She is on Donepezil 10mg  daily and Memantine ER 14mg  daily. There do not seem to be any significant behavioral/sleep changes at this time, on occasion she has reported visual hallucinations. She is in a supervised environment, continue 24/7 care. She was encouraged to participate in group activities. Follow-up in 6 months, they know to call for any changes.    Thank you for allowing me to participate in the care of this patient. Please do not hesitate to call for any questions or concerns.   , M.D.  CC: Dr. 

## 2020-04-08 NOTE — Progress Notes (Signed)
 Subjective  Patient ID: Theresa Mendez is a 74 y.o. female.  Patient here today for assessment of elevated BG reading at home without any complaints. Daughter notes that her BG an hour ago at home was first read as high and then on recheck was in the 400s, prompting them to come for eval. She is currently asymptomatic.    Blood Sugar Problem Associated symptoms: no abdominal pain, no cough, no fatigue, no fever, no headaches, no nausea, no shortness of breath and no vomiting     Review of Systems  Constitutional: Negative for activity change, appetite change, chills, fatigue and fever.  Respiratory: Negative for cough and shortness of breath.   Gastrointestinal: Negative for abdominal pain, nausea and vomiting.  Endocrine: Negative for polydipsia, polyphagia and polyuria.  Genitourinary: Negative for decreased urine volume, difficulty urinating, dysuria, frequency, pelvic pain and urgency.  Neurological: Negative for dizziness and headaches.    Patient History   Allergies: Allergies  Allergen Reactions  . Simvastatin Other    There is no problem list on file for this patient.  Past Medical History:  Diagnosis Date  . Diabetes mellitus (CMS/HCC)   . Hypertension   . Spine pain    History reviewed. No pertinent surgical history. Social History   Socioeconomic History  . Marital status: Not on file    Spouse name: Not on file  . Number of children: Not on file  . Years of education: Not on file  . Highest education level: Not on file  Occupational History  . Not on file  Tobacco Use  . Smoking status: Never Smoker  . Smokeless tobacco: Never Used  Substance and Sexual Activity  . Alcohol use: Not on file  . Drug use: Not on file  . Sexual activity: Not on file  Other Topics Concern  . Not on file  Social History Narrative  . Not on file   No family history on file. Current Outpatient Medications on File Prior to Visit  Medication Sig Dispense Refill  .  hydrALAZINE (Apresoline) 100 MG tablet Take 100 mg by mouth 3 times a day.    . lisinopril  40 MG tablet Take 40 mg by mouth.    . metFORMIN (Glucophage) 500 MG tablet Take 500 mg by mouth.    . metoprolol  tartrate (Lopressor ) 100 MG tablet Take by mouth.    . potassium chloride CR (Klor-Con) 8 MEQ ER tablet Take 8 mEq by mouth.    [name] SITagliptin (Januvia) 100 MG tablet Take by mouth.    . spironolactone (Aldactone) 100 MG tablet Take 25 mg by mouth.     No current facility-administered medications on file prior to visit.    Objective   Vitals:   04/08/20 1735  BP: 139/79  BP Location: Right arm  Pulse: 62  Resp: 18  Temp: 36.9 C (98.4 F)  TempSrc: Oral  SpO2: 98%  Weight: 81.6 kg  Height: 5' 3   Physical Exam Vitals and nursing note reviewed.  Constitutional:      Appearance: Normal appearance. She is not ill-appearing, toxic-appearing or diaphoretic.  HENT:     Head: Normocephalic.  Eyes:     General: Vision grossly intact.  Neck:     Vascular: No JVD.  Cardiovascular:     Rate and Rhythm: Normal rate and regular rhythm.  Pulmonary:     Effort: Pulmonary effort is normal. No respiratory distress.     Breath sounds: Normal breath sounds.  Musculoskeletal:  Cervical back: Full passive range of motion without pain.  Neurological:     General: No focal deficit present.     Mental Status: She is alert.     GCS: GCS eye subscore is 4. GCS verbal subscore is 5. GCS motor subscore is 6.  Psychiatric:        Behavior: Behavior is cooperative.      Results for orders placed or performed in visit on 04/08/20  POCT glucose manually entry  Component Result   Glucose Blood, POC 155 (A)    Procedures MDM:     1 Acute, uncomplicated illness or injury     Explanation of Medical Decision Making and variances from expected care:  Normal exam, asymptomatic. BG today 155, daughter also rechecked on her machine in clinic, BG 131. Stable for discharge home, with education  given for ER follow up as needed and otherwise PCP follow up this week for recheck.   Risk:: Low     Assessment/Plan  Diagnoses and all orders for this visit: Elevated blood sugar level Other orders -     POCT glucose manually entry      Progress note signed by Ozell Staff, PA on 04/08/20 at  6:12 PM

## 2020-04-10 ENCOUNTER — Telehealth: Payer: Self-pay | Admitting: Neurology

## 2020-04-10 NOTE — Telephone Encounter (Signed)
Patient's sister called in. The patient is starting to walk differently. She said it's like she is stumbling. Is that a part of her alzheimer's? Theresa Mendez also wanted to know Dr. Rosalyn Gess opinion on if they should tell the patient that her husband passed away. They were not able to tell her while she was at a facility. They don't want that knowledge to make her decline more.

## 2020-04-17 DIAGNOSIS — IMO0002 Reserved for concepts with insufficient information to code with codable children: Secondary | ICD-10-CM | POA: Insufficient documentation

## 2020-04-17 DIAGNOSIS — E1165 Type 2 diabetes mellitus with hyperglycemia: Secondary | ICD-10-CM | POA: Insufficient documentation

## 2020-04-28 NOTE — Telephone Encounter (Signed)
Spoke to her sister during her own appointment in our office.

## 2020-05-05 ENCOUNTER — Ambulatory Visit (HOSPITAL_COMMUNITY)
Admission: EM | Admit: 2020-05-05 | Discharge: 2020-05-05 | Disposition: A | Discharge status: Home / Self Care | Payer: Medicare Other

## 2020-05-05 ENCOUNTER — Other Ambulatory Visit: Payer: Self-pay

## 2020-05-05 ENCOUNTER — Emergency Department (HOSPITAL_COMMUNITY): Payer: Medicare Other

## 2020-05-05 ENCOUNTER — Encounter (HOSPITAL_COMMUNITY): Payer: Self-pay | Admitting: Emergency Medicine

## 2020-05-05 ENCOUNTER — Observation Stay (HOSPITAL_COMMUNITY)
Admission: EM | Admit: 2020-05-05 | Discharge: 2020-05-06 | Disposition: A | Discharge status: Home / Self Care | Payer: Medicare Other | Attending: Family Medicine | Admitting: Family Medicine

## 2020-05-05 DIAGNOSIS — F0390 Unspecified dementia without behavioral disturbance: Secondary | ICD-10-CM | POA: Diagnosis not present

## 2020-05-05 DIAGNOSIS — Z7984 Long term (current) use of oral hypoglycemic drugs: Secondary | ICD-10-CM | POA: Diagnosis not present

## 2020-05-05 DIAGNOSIS — I152 Hypertension secondary to endocrine disorders: Secondary | ICD-10-CM | POA: Diagnosis present

## 2020-05-05 DIAGNOSIS — J129 Viral pneumonia, unspecified: Secondary | ICD-10-CM | POA: Diagnosis present

## 2020-05-05 DIAGNOSIS — E119 Type 2 diabetes mellitus without complications: Secondary | ICD-10-CM | POA: Diagnosis not present

## 2020-05-05 DIAGNOSIS — R2681 Unsteadiness on feet: Secondary | ICD-10-CM | POA: Insufficient documentation

## 2020-05-05 DIAGNOSIS — R531 Weakness: Secondary | ICD-10-CM | POA: Insufficient documentation

## 2020-05-05 DIAGNOSIS — G309 Alzheimer's disease, unspecified: Secondary | ICD-10-CM | POA: Diagnosis not present

## 2020-05-05 DIAGNOSIS — J189 Pneumonia, unspecified organism: Principal | ICD-10-CM | POA: Insufficient documentation

## 2020-05-05 DIAGNOSIS — E1159 Type 2 diabetes mellitus with other circulatory complications: Secondary | ICD-10-CM | POA: Diagnosis present

## 2020-05-05 DIAGNOSIS — D72829 Elevated white blood cell count, unspecified: Secondary | ICD-10-CM

## 2020-05-05 DIAGNOSIS — Z87891 Personal history of nicotine dependence: Secondary | ICD-10-CM | POA: Diagnosis not present

## 2020-05-05 DIAGNOSIS — E1165 Type 2 diabetes mellitus with hyperglycemia: Secondary | ICD-10-CM

## 2020-05-05 DIAGNOSIS — F32A Depression, unspecified: Secondary | ICD-10-CM | POA: Diagnosis present

## 2020-05-05 DIAGNOSIS — R2689 Other abnormalities of gait and mobility: Secondary | ICD-10-CM | POA: Diagnosis not present

## 2020-05-05 DIAGNOSIS — F419 Anxiety disorder, unspecified: Secondary | ICD-10-CM | POA: Diagnosis present

## 2020-05-05 DIAGNOSIS — I10 Essential (primary) hypertension: Secondary | ICD-10-CM | POA: Insufficient documentation

## 2020-05-05 DIAGNOSIS — M6281 Muscle weakness (generalized): Secondary | ICD-10-CM | POA: Diagnosis not present

## 2020-05-05 DIAGNOSIS — Z20822 Contact with and (suspected) exposure to covid-19: Secondary | ICD-10-CM | POA: Diagnosis not present

## 2020-05-05 DIAGNOSIS — F0280 Dementia in other diseases classified elsewhere without behavioral disturbance: Secondary | ICD-10-CM | POA: Diagnosis not present

## 2020-05-05 DIAGNOSIS — Z79899 Other long term (current) drug therapy: Secondary | ICD-10-CM | POA: Insufficient documentation

## 2020-05-05 HISTORY — DX: Alzheimer's disease, unspecified: G30.9

## 2020-05-05 HISTORY — DX: Dementia in other diseases classified elsewhere, unspecified severity, without behavioral disturbance, psychotic disturbance, mood disturbance, and anxiety: F02.80

## 2020-05-05 LAB — COMPREHENSIVE METABOLIC PANEL
ALT: 15 U/L (ref 0–44)
AST: 15 U/L (ref 15–41)
Albumin: 4.1 g/dL (ref 3.5–5.0)
Alkaline Phosphatase: 76 U/L (ref 38–126)
Anion gap: 11 (ref 5–15)
BUN: 15 mg/dL (ref 8–23)
CO2: 26 mmol/L (ref 22–32)
Calcium: 9.7 mg/dL (ref 8.9–10.3)
Chloride: 101 mmol/L (ref 98–111)
Creatinine, Ser: 0.98 mg/dL (ref 0.44–1.00)
GFR, Estimated: >60 mL/min (ref >60)
Glucose, Bld: 238 mg/dL — ABNORMAL HIGH (ref 70–99)
Potassium: 3.9 mmol/L (ref 3.5–5.1)
Sodium: 138 mmol/L (ref 135–145)
Total Bilirubin: 0.2 mg/dL — ABNORMAL LOW (ref 0.3–1.2)
Total Protein: 7.9 g/dL (ref 6.5–8.1)

## 2020-05-05 LAB — CBC WITH DIFFERENTIAL/PLATELET
Abs Immature Granulocytes: 0.03 10*3/uL (ref 0.00–0.07)
Basophils Absolute: 0 10*3/uL (ref 0.0–0.1)
Basophils Relative: 0 %
Eosinophils Absolute: 0.1 10*3/uL (ref 0.0–0.5)
Eosinophils Relative: 1 %
HCT: 43.2 % (ref 36.0–46.0)
Hemoglobin: 13.6 g/dL (ref 12.0–15.0)
Immature Granulocytes: 0 %
Lymphocytes Relative: 14 %
Lymphs Abs: 1.5 10*3/uL (ref 0.7–4.0)
MCH: 30.6 pg (ref 26.0–34.0)
MCHC: 31.5 g/dL (ref 30.0–36.0)
MCV: 97.3 fL (ref 80.0–100.0)
Monocytes Absolute: 0.8 10*3/uL (ref 0.1–1.0)
Monocytes Relative: 7 %
Neutro Abs: 8.4 10*3/uL — ABNORMAL HIGH (ref 1.7–7.7)
Neutrophils Relative %: 78 %
Platelets: 284 10*3/uL (ref 150–400)
RBC: 4.44 MIL/uL (ref 3.87–5.11)
RDW: 11.9 % (ref 11.5–15.5)
WBC: 10.8 10*3/uL — ABNORMAL HIGH (ref 4.0–10.5)
nRBC: 0 % (ref 0.0–0.2)

## 2020-05-05 MED ORDER — CEFTRIAXONE SODIUM 1 G IJ SOLR
1.0000 g | Freq: Once | INTRAMUSCULAR | Status: AC
  Administered 2020-05-06: 1 g via INTRAVENOUS
  Filled 2020-05-05: qty 10

## 2020-05-05 MED ORDER — SODIUM CHLORIDE 0.9 % IV SOLN
500.0000 mg | Freq: Once | INTRAVENOUS | Status: AC
  Administered 2020-05-06: 500 mg via INTRAVENOUS
  Filled 2020-05-05: qty 500

## 2020-05-05 NOTE — ED Notes (Signed)
Report given to ED first nurse Byrd Hesselbach.

## 2020-05-05 NOTE — ED Triage Notes (Signed)
Pt presents with cough and chest congestion x 5 days. Pt states she feels pressure on her chest. Pt sister/care giver states that she has been out of it since mid day and the weakness just started today. Pt care giver states that there is a Dementia diagnosis. Pt denies nausea, emesis, fever and SOB.

## 2020-05-05 NOTE — ED Notes (Signed)
This RN attempted several times to draw blood from this pt with no success. Phlebotomy will be consulted and then IV antibiotics are to be administered.

## 2020-05-05 NOTE — ED Notes (Addendum)
Attempted to take patient to the car with sister to go to ED. Patient was able to stand up from wheelchair and started to collapse. Patient stabilized with the help of staff and placed back in wheelchair.

## 2020-05-05 NOTE — ED Triage Notes (Signed)
Patient arrived with EMS from urgent care reports increasing  generalized weakness , CBG= 302 by EMS with occasional cough and fatigue , Alzheimer's dementia - lives at an assisted living .

## 2020-05-05 NOTE — ED Notes (Signed)
EMS called

## 2020-05-05 NOTE — ED Notes (Signed)
Patient is being discharged from the Urgent Care Center and sent to the Emergency Department via wheelchair by staff. Per Wendee Beavers, NP, patient is stable but in need of higher level of care due to weakness, cough, congestion. Patient is aware and verbalizes understanding of plan of care.  Vitals:   05/05/20 1802  BP: 137/72  Pulse: 92  Resp: (!) 24  Temp: (!) 100.6 F (38.1 C)  SpO2: 96%

## 2020-05-06 DIAGNOSIS — E1159 Type 2 diabetes mellitus with other circulatory complications: Secondary | ICD-10-CM

## 2020-05-06 DIAGNOSIS — F0280 Dementia in other diseases classified elsewhere without behavioral disturbance: Secondary | ICD-10-CM

## 2020-05-06 DIAGNOSIS — E1165 Type 2 diabetes mellitus with hyperglycemia: Secondary | ICD-10-CM | POA: Diagnosis not present

## 2020-05-06 DIAGNOSIS — J189 Pneumonia, unspecified organism: Secondary | ICD-10-CM | POA: Diagnosis not present

## 2020-05-06 DIAGNOSIS — F419 Anxiety disorder, unspecified: Secondary | ICD-10-CM | POA: Diagnosis not present

## 2020-05-06 DIAGNOSIS — I152 Hypertension secondary to endocrine disorders: Secondary | ICD-10-CM

## 2020-05-06 DIAGNOSIS — F32A Depression, unspecified: Secondary | ICD-10-CM

## 2020-05-06 DIAGNOSIS — J129 Viral pneumonia, unspecified: Secondary | ICD-10-CM | POA: Diagnosis present

## 2020-05-06 DIAGNOSIS — G309 Alzheimer's disease, unspecified: Secondary | ICD-10-CM | POA: Diagnosis not present

## 2020-05-06 DIAGNOSIS — D72829 Elevated white blood cell count, unspecified: Secondary | ICD-10-CM

## 2020-05-06 LAB — GLUCOSE, CAPILLARY
Glucose-Capillary: 183 mg/dL — ABNORMAL HIGH (ref 70–99)
Glucose-Capillary: 270 mg/dL — ABNORMAL HIGH (ref 70–99)

## 2020-05-06 LAB — PHOSPHORUS: Phosphorus: 3.6 mg/dL (ref 2.5–4.6)

## 2020-05-06 LAB — RESPIRATORY PANEL BY PCR

## 2020-05-06 LAB — URINALYSIS, ROUTINE W REFLEX MICROSCOPIC
Bacteria, UA: NONE SEEN
Bilirubin Urine: NEGATIVE
Glucose, UA: NEGATIVE mg/dL
Hgb urine dipstick: NEGATIVE
Ketones, ur: NEGATIVE mg/dL
Leukocytes,Ua: NEGATIVE
Nitrite: NEGATIVE
Protein, ur: 30 mg/dL — AB
Specific Gravity, Urine: 1.024 (ref 1.005–1.030)
pH: 5 (ref 5.0–8.0)

## 2020-05-06 LAB — PROTIME-INR
INR: 1.1 (ref 0.8–1.2)
Prothrombin Time: 13.8 seconds (ref 11.4–15.2)

## 2020-05-06 LAB — LACTIC ACID, PLASMA: Lactic Acid, Venous: 0.8 mmol/L (ref 0.5–1.9)

## 2020-05-06 LAB — CBC
HCT: 41.4 % (ref 36.0–46.0)
Hemoglobin: 13.2 g/dL (ref 12.0–15.0)
MCH: 31.1 pg (ref 26.0–34.0)
MCHC: 31.9 g/dL (ref 30.0–36.0)
MCV: 97.6 fL (ref 80.0–100.0)
Platelets: 237 10*3/uL (ref 150–400)
RBC: 4.24 MIL/uL (ref 3.87–5.11)
RDW: 11.9 % (ref 11.5–15.5)
WBC: 8.4 10*3/uL (ref 4.0–10.5)
nRBC: 0 % (ref 0.0–0.2)

## 2020-05-06 LAB — STREP PNEUMONIAE URINARY ANTIGEN: Strep Pneumo Urinary Antigen: NEGATIVE

## 2020-05-06 LAB — HEMOGLOBIN A1C
Hgb A1c MFr Bld: 6.2 % — ABNORMAL HIGH (ref 4.8–5.6)
Mean Plasma Glucose: 131.24 mg/dL

## 2020-05-06 LAB — PROCALCITONIN: Procalcitonin: <0.1 ng/mL

## 2020-05-06 LAB — RESPIRATORY PANEL BY RT PCR (FLU A&B, COVID)
Influenza A by PCR: NEGATIVE
Influenza B by PCR: NEGATIVE
SARS Coronavirus 2 by RT PCR: NEGATIVE

## 2020-05-06 LAB — MAGNESIUM: Magnesium: 1.8 mg/dL (ref 1.7–2.4)

## 2020-05-06 LAB — CBG MONITORING, ED: Glucose-Capillary: 174 mg/dL — ABNORMAL HIGH (ref 70–99)

## 2020-05-06 LAB — CREATININE, SERUM
Creatinine, Ser: 0.8 mg/dL (ref 0.44–1.00)
GFR, Estimated: >60 mL/min (ref >60)

## 2020-05-06 LAB — APTT: aPTT: 34 seconds (ref 24–36)

## 2020-05-06 MED ORDER — SODIUM CHLORIDE 0.9 % IV SOLN
500.0000 mg | INTRAVENOUS | Status: DC
  Filled 2020-05-06: qty 500

## 2020-05-06 MED ORDER — INSULIN ASPART 100 UNIT/ML ~~LOC~~ SOLN: 0.0000 [IU] | Freq: Every day | SUBCUTANEOUS | Status: DC

## 2020-05-06 MED ORDER — METOPROLOL TARTRATE 50 MG PO TABS
100.0000 mg | ORAL_TABLET | Freq: Every day | ORAL | Status: DC
  Administered 2020-05-06: 100 mg via ORAL
  Filled 2020-05-06: qty 2

## 2020-05-06 MED ORDER — BUPROPION HCL 75 MG PO TABS
75.0000 mg | ORAL_TABLET | Freq: Every day | ORAL | Status: DC
  Administered 2020-05-06: 75 mg via ORAL
  Filled 2020-05-06: qty 1

## 2020-05-06 MED ORDER — AMLODIPINE BESYLATE 2.5 MG PO TABS
2.5000 mg | ORAL_TABLET | Freq: Every day | ORAL | Status: DC
  Administered 2020-05-06: 2.5 mg via ORAL
  Filled 2020-05-06: qty 1

## 2020-05-06 MED ORDER — MEMANTINE HCL ER 14 MG PO CP24
14.0000 mg | ORAL_CAPSULE | Freq: Every day | ORAL | Status: DC
  Administered 2020-05-06: 14 mg via ORAL
  Filled 2020-05-06: qty 1

## 2020-05-06 MED ORDER — ACETAMINOPHEN 325 MG PO TABS
650.0000 mg | ORAL_TABLET | Freq: Four times a day (QID) | ORAL | Status: DC | PRN
  Administered 2020-05-06: 650 mg via ORAL
  Filled 2020-05-06: qty 2

## 2020-05-06 MED ORDER — LISINOPRIL 40 MG PO TABS
40.0000 mg | ORAL_TABLET | Freq: Every day | ORAL | Status: DC
  Administered 2020-05-06: 40 mg via ORAL
  Filled 2020-05-06: qty 1

## 2020-05-06 MED ORDER — ACETAMINOPHEN 325 MG PO TABS
650.0000 mg | ORAL_TABLET | Freq: Once | ORAL | Status: AC
  Administered 2020-05-06: 650 mg via ORAL
  Filled 2020-05-06: qty 2

## 2020-05-06 MED ORDER — DOXYCYCLINE MONOHYDRATE 100 MG PO TABS: 100.0000 mg | ORAL_TABLET | Freq: Two times a day (BID) | ORAL | 0 refills | Status: AC

## 2020-05-06 MED ORDER — DONEPEZIL HCL 10 MG PO TABS: 10.0000 mg | ORAL_TABLET | Freq: Every day | ORAL | Status: DC

## 2020-05-06 MED ORDER — INSULIN ASPART 100 UNIT/ML ~~LOC~~ SOLN
0.0000 [IU] | Freq: Three times a day (TID) | SUBCUTANEOUS | Status: DC
  Administered 2020-05-06: 8 [IU] via SUBCUTANEOUS
  Administered 2020-05-06: 3 [IU] via SUBCUTANEOUS

## 2020-05-06 MED ORDER — SODIUM CHLORIDE 0.9 % IV SOLN
1.0000 g | INTRAVENOUS | Status: DC
  Filled 2020-05-06: qty 10

## 2020-05-06 MED ORDER — DM-GUAIFENESIN ER 30-600 MG PO TB12
1.0000 | ORAL_TABLET | Freq: Two times a day (BID) | ORAL | Status: DC
  Administered 2020-05-06: 1 via ORAL
  Filled 2020-05-06: qty 1

## 2020-05-06 MED ORDER — ENOXAPARIN SODIUM 40 MG/0.4ML ~~LOC~~ SOLN: 40.0000 mg | SUBCUTANEOUS | Status: DC

## 2020-05-06 NOTE — Evaluation (Signed)
Occupational Therapy Evaluation Patient Details Name: Theresa Mendez MRN: [id number] DOB: 12-27-1945 Today's Date: 05/06/2020    History of Present Illness Pt is a 74 y/o female with PMH of alzheimers dementia, DM, HTN presenting for generalized weakness, cough and fever. Admitted for presumed CAP.    Clinical Impression   PTA patient reports independent with mobility and ADLs, admitted for above and limited by problem list below, including: generalized weakness, impaired balance, decreased activity tolerance and impaired cognition.  Noted hx of alzheimer's dementia at baseline, oriented to self and hospital only, follows simple 1 step commands with difficulty problem solving and decreased awareness. She requires min guard for bed mobility, min assist for transfers, and up to min assist for ADLs. She reports having support at home from spouse, but if she does not have 24/7 support believe she will benefit from continued OT services after dc at SNF level.  Will follow acutely.     Follow Up Recommendations  SNF;Home health OT;Supervision/Assistance - 24 hour (HHOT if able to have 24/7 support)    Equipment Recommendations  3 in 1 bedside commode;Other (comment) (RW )    Recommendations for Other Services       Precautions / Restrictions Precautions Precautions: Fall Restrictions Weight Bearing Restrictions: No      Mobility Bed Mobility Overal bed mobility: Needs Assistance Bed Mobility: Supine to Sit     Supine to sit: Min guard Sit to supine: Min guard   General bed mobility comments: incraesed time and effort, HOB elevated but no physical assist required    Transfers Overall transfer level: Needs assistance Equipment used: 1 person hand held assist Transfers: Sit to/from [employer] Sit to Stand: Min assist Stand pivot transfers: Min assist       General transfer comment: min assist to power up and steady into standing, pivoting to recliner (reaching  for BUE support to recliner arm rest)     Balance Overall balance assessment: Needs assistance Sitting-balance support: No upper extremity supported;Feet supported Sitting balance-Leahy Scale: Fair Sitting balance - Comments: able to don socks with supervision dynamically   Standing balance support: Bilateral upper extremity supported;During functional activity;Single extremity supported Standing balance-Leahy Scale: Poor Standing balance comment: relies on external and UE support                           ADL either performed or assessed with clinical judgement   ADL Overall ADL's : Needs assistance/impaired     Grooming: Set up;Sitting   Upper Body Bathing: Set up;Sitting   Lower Body Bathing: Minimal assistance;Sit to/from stand   Upper Body Dressing : Sitting;Minimal assistance   Lower Body Dressing: Minimal assistance;Sit to/from stand Lower Body Dressing Details (indicate cue type and reason): able to don socks with increased time, min assist sit to stand  Toilet Transfer: Minimal assistance;Stand-pivot Toilet Transfer Details (indicate cue type and reason): simulated to recliner          Functional mobility during ADLs: Minimal assistance;Cueing for safety;Cueing for sequencing General ADL Comments: pt limited by cognition, weakness, impaired balance and decreased activity tolerance     Vision   Vision Assessment?: No apparent visual deficits     Perception     Praxis      Pertinent Vitals/Pain Pain Assessment: Faces Faces Pain Scale: Hurts a little bit Pain Location: back Pain Descriptors / Indicators: Discomfort Pain Intervention(s): Limited activity within patient's tolerance;Monitored during session;Repositioned     Hand  Dominance Right   Extremity/Trunk Assessment Upper Extremity Assessment Upper Extremity Assessment: Overall WFL for tasks assessed   Lower Extremity Assessment Lower Extremity Assessment: Defer to PT evaluation        Communication Communication Communication: No difficulties   Cognition Arousal/Alertness: Awake/alert Behavior During Therapy: WFL for tasks assessed/performed Overall Cognitive Status: History of cognitive impairments - at baseline Area of Impairment: Orientation;Memory;Following commands;Safety/judgement;Awareness;Problem solving                 Orientation Level: Disoriented to;Time;Situation   Memory: Decreased short-term memory Following Commands: Follows one step commands consistently;Follows one step commands with increased time;Follows multi-step commands inconsistently Safety/Judgement: Decreased awareness of safety;Decreased awareness of deficits Awareness: Emergent Problem Solving: Slow processing;Decreased initiation;Difficulty sequencing;Requires verbal cues General Comments: pt oriented to self and hopstial, follows simple commands with increased time and cueing but poor awareness of situtation    General Comments  pt on 2L [place], Hr 80-90    Exercises     Shoulder Instructions      Home Living Family/patient expects to be discharged to:: Private residence Living Arrangements: Spouse/significant other Available Help at Discharge: Family Type of Home: House Home Access: Level entry     Home Layout: One level     Bathroom Shower/Tub: Producer, television/film/video: Standard     Home Equipment: None   Additional Comments: questionable historian, hx of alzheimers dementia      Prior Functioning/Environment Level of Independence: Independent        Comments: reports independnet with ADls, IADLs (but reports spouse assist, and has some assist with IADLs from paid caregiver)        OT Problem List: Decreased strength;Decreased activity tolerance;Impaired balance (sitting and/or standing);Decreased safety awareness;Decreased knowledge of use of DME or AE;Decreased knowledge of precautions;Cardiopulmonary status limiting activity      OT  Treatment/Interventions: Self-care/ADL training;Therapeutic exercise;DME and/or AE instruction;Therapeutic activities;Patient/family education;Balance training;Energy conservation    OT Goals(Current goals can be found in the care plan section) Acute Rehab OT Goals Patient Stated Goal: to feel better OT Goal Formulation: With patient Time For Goal Achievement: 05/20/20 Potential to Achieve Goals: Good  OT Frequency: Min 2X/week   Barriers to D/C:            Co-evaluation              AM-PAC OT "6 Clicks" Daily Activity     Outcome Measure Help from another person eating meals?: A Little Help from another person taking care of personal grooming?: A Little Help from another person toileting, which includes using toliet, bedpan, or urinal?: A Lot Help from another person bathing (including washing, rinsing, drying)?: A Little Help from another person to put on and taking off regular upper body clothing?: A Little Help from another person to put on and taking off regular lower body clothing?: A Little 6 Click Score: 17   End of Session Equipment Utilized During Treatment: Oxygen Nurse Communication: Mobility status  Activity Tolerance: Patient tolerated treatment well Patient left: in chair;with call bell/phone within reach;with chair alarm set  OT Visit Diagnosis: Muscle weakness (generalized) (M62.81);Unsteadiness on feet (R26.81);Pain Pain - part of body:  (back)                Time: 5188-4166 OT Time Calculation (min): 22 min Charges:  OT General Charges $OT Visit: 1 Visit OT Evaluation $OT Eval Moderate Complexity: 1 Mod  Barry Brunner, OT Acute Rehabilitation Services Pager [phone] Office [phone]   Lorene Dy  S Jamika Sadek 05/06/2020, 10:44 AM

## 2020-05-06 NOTE — Evaluation (Signed)
Physical Therapy Evaluation Patient Details Name: Theresa Mendez MRN: [id number] DOB: Aug 06, 1945 Today's Date: 05/06/2020   History of Present Illness  74yo female with history of alzheimer's dementia, DM, hypertension, presents with concern for generalized weakness, cough and fever  Clinical Impression  Pt admitted with above diagnosis. Pt with baseline cognitive deficits and unable to fully orient even with cueing. Pt states she lives with her husband in a one story house with no AD and is I, will need to confirm. Pt requiring increased assist to coordinate bed mobility and transfers. Pt with increased pain in R hip during mobility. Pt demonstrating deficits in balance, strength, coordination, endurance, gait and safety and will benefit from skilled PT to address deficits to maximize independence with functional mobility prior to discharge. Recommend d/c to SNF for additional therapy and safety    Follow Up Recommendations SNF;Supervision/Assistance - 24 hour    Equipment Recommendations  Rolling walker with 5" wheels;3in1 (PT)    Recommendations for Other Services       Precautions / Restrictions Precautions Precautions: Fall      Mobility  Bed Mobility Overal bed mobility: Needs Assistance Bed Mobility: Supine to Sit;Sit to Supine     Supine to sit: Mod assist;HOB elevated Sit to supine: Min guard   General bed mobility comments: pt requiring mod A to coordinate supine>sit    Transfers Overall transfer level: Needs assistance Equipment used: 1 person hand held assist Transfers: Sit to/from [employer] Sit to Stand: Mod assist Stand pivot transfers: Mod assist       General transfer comment: mod A needed for sit<>stand as well as to side step to [facility]  Ambulation/Gait                Stairs            Wheelchair Mobility    Modified Rankin (Stroke Patients Only)       Balance Overall balance assessment: Needs  assistance Sitting-balance support: Feet supported;Single extremity supported Sitting balance-Leahy Scale: Fair     Standing balance support: Bilateral upper extremity supported Standing balance-Leahy Scale: Poor                               Pertinent Vitals/Pain Pain Assessment: Faces Faces Pain Scale: Hurts whole lot Pain Location: R HIP    Home Living Family/patient expects to be discharged to:: Private residence Living Arrangements: Spouse/significant other Available Help at Discharge: Family Type of Home: House Home Access: Level entry     Home Layout: One level Home Equipment: None Additional Comments: pt not oriented and has baseline cognitive issues. Will need to confirm home environment and PLOF    Prior Function Level of Independence: Independent               Hand Dominance        Extremity/Trunk Assessment   Upper Extremity Assessment Upper Extremity Assessment: Overall WFL for tasks assessed    Lower Extremity Assessment Lower Extremity Assessment: Generalized weakness       Communication   Communication: Expressive difficulties  Cognition Arousal/Alertness: Lethargic Behavior During Therapy: WFL for tasks assessed/performed Overall Cognitive Status: History of cognitive impairments - at baseline                                 General Comments: pt is not oriented to situation or time. Pt  is orient to location and herself. will need to follow up to confirm PLOF as well as living environment      General Comments      Exercises     Assessment/Plan    PT Assessment Patient needs continued PT services  PT Problem List Decreased strength;Decreased mobility;Decreased safety awareness;Decreased coordination;Decreased activity tolerance;Decreased cognition;Decreased balance;Decreased knowledge of use of DME       PT Treatment Interventions Therapeutic exercise;Gait training;Balance training;Functional mobility  training;Therapeutic activities;Patient/family education    PT Goals (Current goals can be found in the Care Plan section)  Acute Rehab PT Goals Patient Stated Goal: to stop the pain PT Goal Formulation: With patient Time For Goal Achievement: 05/20/20 Potential to Achieve Goals: Fair    Frequency Min 2X/week   Barriers to discharge        Co-evaluation               AM-PAC PT "6 Clicks" Mobility  Outcome Measure Help needed turning from your back to your side while in a flat bed without using bedrails?: A Little Help needed moving from lying on your back to sitting on the side of a flat bed without using bedrails?: A Lot Help needed moving to and from a bed to a chair (including a wheelchair)?: A Lot Help needed standing up from a chair using your arms (e.g., wheelchair or bedside chair)?: A Lot Help needed to walk in hospital room?: A Lot Help needed climbing 3-5 steps with a railing? : Total 6 Click Score: 12    End of Session Equipment Utilized During Treatment: Gait belt Activity Tolerance: Patient tolerated treatment well;Patient limited by pain Patient left: in bed;with bed alarm set;with call bell/phone within reach Nurse Communication: Mobility status PT Visit Diagnosis: Unsteadiness on feet (R26.81);Muscle weakness (generalized) (M62.81);Other abnormalities of gait and mobility (R26.89)    Time: 6063-0160 PT Time Calculation (min) (ACUTE ONLY): 13 min   Charges:   PT Evaluation $PT Eval Low Complexity: 1 Low          Ginette Otto, DPT Acute Rehabilitation Services [phone]  Lucretia Field 05/06/2020, 10:33 AM

## 2020-05-06 NOTE — Care Management Obs Status (Signed)
MEDICARE OBSERVATION STATUS NOTIFICATION   Patient Details  Name: Theresa Mendez MRN: [id number] Date of Birth: 08-28-1945   Medicare Observation Status Notification Given:  Yes    Kingsley Plan, RN 05/06/2020, 2:34 PM

## 2020-05-06 NOTE — Discharge Instructions (Signed)
Community-Acquired Pneumonia, Adult Pneumonia is an infection of the lungs. It causes swelling in the airways of the lungs. Mucus and fluid may also build up inside the airways. One type of pneumonia can happen while a person is in a hospital. A different type can happen when a person is not in a hospital (community-acquired pneumonia).  What are the causes?  This condition is caused by germs (viruses, bacteria, or fungi). Some types of germs can be passed from one person to another. This can happen when you breathe in droplets from the cough or sneeze of an infected person. What increases the risk? You are more likely to develop this condition if you:  Have a long-term (chronic) disease, such as: ? Chronic obstructive pulmonary disease (COPD). ? Asthma. ? Cystic fibrosis. ? Congestive heart failure. ? Diabetes. ? Kidney disease.  Have HIV.  Have sickle cell disease.  Have had your spleen removed.  Do not take good care of your teeth and mouth (poor dental hygiene).  Have a medical condition that increases the risk of breathing in droplets from your own mouth and nose.  Have a weakened body defense system (immune system).  Are a smoker.  Travel to areas where the germs that cause this illness are common.  Are around certain animals or the places they live. What are the signs or symptoms?  A dry cough.  A wet (productive) cough.  Fever.  Sweating.  Chest pain. This often happens when breathing deeply or coughing.  Fast breathing or trouble breathing.  Shortness of breath.  Shaking chills.  Feeling tired (fatigue).  Muscle aches. How is this treated? Treatment for this condition depends on many things. Most adults can be treated at home. In some cases, treatment must happen in a hospital. Treatment may include:  Medicines given by mouth or through an IV tube.  Being given extra oxygen.  Respiratory therapy. In rare cases, treatment for very bad pneumonia  may include:  Using a machine to help you breathe.  Having a procedure to remove fluid from around your lungs. Follow these instructions at home: Medicines  Take over-the-counter and prescription medicines only as told by your doctor. ? Only take cough medicine if you are losing sleep.  If you were prescribed an antibiotic medicine, take it as told by your doctor. Do not stop taking the antibiotic even if you start to feel better. General instructions   Sleep with your head and neck raised (elevated). You can do this by sleeping in a recliner or by putting a few pillows under your head.  Rest as needed. Get at least 8 hours of sleep each night.  Drink enough water to keep your pee (urine) pale yellow.  Eat a healthy diet that includes plenty of vegetables, fruits, whole grains, low-fat dairy products, and lean protein.  Do not use any products that contain nicotine or tobacco. These include cigarettes, e-cigarettes, and chewing tobacco. If you need help quitting, ask your doctor.  Keep all follow-up visits as told by your doctor. This is important. How is this prevented? A shot (vaccine) can help prevent pneumonia. Shots are often suggested for:  People older than 74 years of age.  People older than 74 years of age who: ? Are having cancer treatment. ? Have long-term (chronic) lung disease. ? Have problems with their body's defense system. You may also prevent pneumonia if you take these actions:  Get the flu (influenza) shot every year.  Go to the dentist as   often as told.  Wash your hands often. If you cannot use soap and water, use hand sanitizer. Contact a doctor if:  You have a fever.  You lose sleep because your cough medicine does not help. Get help right away if:  You are short of breath and it gets worse.  You have more chest pain.  Your sickness gets worse. This is very serious if: ? You are an older adult. ? Your body's defense system is weak.  You  cough up blood. Summary  Pneumonia is an infection of the lungs.  Most adults can be treated at home. Some will need treatment in a hospital.  Drink enough water to keep your pee pale yellow.  Get at least 8 hours of sleep each night. This information is not intended to replace advice given to you by your health care provider. Make sure you discuss any questions you have with your health care provider. Document Revised: 09/19/2018 Document Reviewed: 01/25/2018 Elsevier Patient Education  2020 [employer]  

## 2020-05-06 NOTE — Progress Notes (Signed)
Pt discharged to home. Pt sister at bedside. Walker and 3 in 1 given to pt/pt sister at discharge

## 2020-05-06 NOTE — Progress Notes (Addendum)
Inpatient Diabetes Program Recommendations  AACE/ADA: New Consensus Statement on Inpatient Glycemic Control (2015)  Target Ranges:  Prepandial:   less than 140 mg/dL      Peak postprandial:   less than 180 mg/dL (1-2 hours)      Critically ill patients:  140 - 180 mg/dL   Results for Theresa Mendez, Theresa Mendez (MRN [id number]) as of 05/06/2020 11:43  Ref. Range 05/06/2020 00:44 05/06/2020 06:24 05/06/2020 11:03  Glucose-Capillary Latest Ref Range: 70 - 99 mg/dL 952 (H) 841 (H)  3 units NOVOLOG @9 :10am  270 (H)  8 units NOVOLOG     Admit with: Pneumonia  History: DM, Alzhemier's  Home DM Meds: Januvia 100 mg Daily  Current Orders: Novolog Moderate Correction Scale/ SSI (0-15 units) TID AC + HS     MD- Please consider:  Start low dose basal insulin: Levemir 4 units Daily (0.05 units/kg)     --Will follow patient during hospitalization--  RN, MSN, CDE Diabetes Coordinator Inpatient Glycemic Control Team Team Pager: [phone] (8a-5p)

## 2020-05-06 NOTE — ED Notes (Signed)
This RN requested the EDP to order Acetaminophen for Fever. Awaiting further orders.

## 2020-05-06 NOTE — Discharge Summary (Signed)
Physician Discharge Summary  Leomia Blake OZH:[id number] DOB: 1946-02-02 DOA: 05/05/2020  PCP: Patient, No Pcp Per  Admit date: 05/05/2020 Discharge date: 05/06/2020  Admitted From: Independent living Disposition: Independent living  Recommendations for Outpatient Follow-up:  1. Follow up with PCP in 1-2 weeks 2. Please obtain BMP/CBC in one week 3. Please follow up with your PCP on the following pending results: Unresulted Labs (From admission, onward)          Start     Ordered   05/06/20 0533  Strep pneumoniae urinary antigen  Once,   STAT        05/06/20 0536   05/06/20 0532  Expectorated sputum assessment w rflx to resp cult  Once,   R        05/06/20 0536   05/06/20 0532  Legionella Pneumophila Serogp 1 Ur Ag  Once,   STAT        05/06/20 0536   05/05/20 2224  Blood culture (routine x 2)  BLOOD CULTURE X 2,   STAT      05/05/20 2225           Home Health: Yes Equipment/Devices: Rolling walker and commode  Discharge Condition: Stable CODE STATUS: Full code Diet recommendation: Cardiac  Subjective: Seen and examined.  Sister at the bedside.  Patient feels well without any symptoms other than some dry cough.  HPI: Lonnetta Kniskern is a 74 y.o. female with medical history significant for hypertension, diabetes mellitus, Alzheimer's disease who presents to the emergency department via EMS with cough and fever which started today.  Patient was unable to provide history at bedside due to being somnolent (though easily arousable).  History was obtained from ED physician and ED medical record.  Per report, patient presents with generalized weakness today, she typically walks with a cane, but today she collapsed just with standing.  Chest pain, shortness of breath, chest congestion, sore throat, loss of taste or smell or nausea or diarrhea was denied.  Patient was reported to have had Covid vaccine and booster.  ED Course:  In the department, was noted to be febrile with a  temperature of 101.76F, BP on physical/74, but other vital signs were within normal range, O2 sat ranged within 93-100% on room air.  Work-up in the ED showed mild leukocytosis and hyperglycemia.  Lactic acid was negative, urinalysis was unimpressive for UTI.  Respiratory panel for influenza A, B and SARS coronavirus 2 was negative.  Chest x-ray showed chronic appearing increased interstitial lung markings with mild right basilar linear atelectasis.  EKG showed normal sinus rhythm at rate of 93 bpm.  She was started on IV ceftriaxone and Azithromycin for Presumed consequent pneumonia, Tylenol was given due to fever.  Hospitalist was asked to admit patient for further evaluation and management.  Brief/Interim Summary: Patient was admitted under hospital service with presumed bacterial community-acquired pneumonia.  She was tested negative for influenza and COVID-19.  She was started on Rocephin and Zithromax.  When seen this morning, she was feeling better and the only symptom she had was cough which was nonproductive.  No fever.  Respiratory viral panel was checked and she was tested positive for rhinovirus.  Chest x-ray done yesterday did not show clear-cut infiltrates.  Procalcitonin also unremarkable based on this, I think she likely has a viral upper respiratory tract infection.  Patient is feeling good.  Not hypoxic.  She is a stable for discharge.  Although looks like viral URI but to be on the  safe side, I am discharging her on 7 days of oral doxycycline.  Plan of care/discharge discussed with the patient's sister who happens to be her healthcare power of attorney.  Discharge Diagnoses:  Principal Problem:   CAP (community acquired pneumonia) Active Problems:   Anxiety and depression   Alzheimer's disease (HCC)   Hypertension associated with diabetes (HCC)   Hyperglycemia due to diabetes mellitus (HCC)   Leukocytosis   Viral pneumonia    Discharge Instructions   Allergies as of 05/06/2020    No Known Allergies     Medication List    TAKE these medications   acetaminophen 325 MG tablet Commonly known as: TYLENOL Take 650 mg by mouth every 6 (six) hours as needed for moderate pain.   amLODipine 2.5 MG tablet Commonly known as: NORVASC Take 2.5 mg by mouth daily.   buPROPion 75 MG tablet Commonly known as: WELLBUTRIN Take 75 mg by mouth daily.   docusate sodium 100 MG capsule Commonly known as: COLACE Take 100 mg by mouth daily.   donepezil 10 MG tablet Commonly known as: ARICEPT Take 1 tablet (10 mg total) by mouth at bedtime.   doxycycline 100 MG tablet Commonly known as: ADOXA Take 1 tablet (100 mg total) by mouth 2 (two) times daily for 7 days.   hydrALAZINE 100 MG tablet Commonly known as: APRESOLINE Take 100 mg by mouth 3 (three) times daily.   lisinopril 40 MG tablet Commonly known as: ZESTRIL Take 40 mg by mouth daily.   memantine 14 MG Cp24 24 hr capsule Commonly known as: NAMENDA XR Take 1 capsule (14 mg total) by mouth daily.   metoprolol tartrate 100 MG tablet Commonly known as: LOPRESSOR Take 100 mg by mouth daily.   potassium chloride 8 MEQ tablet Commonly known as: KLOR-CON Take by mouth.   sitaGLIPtin 100 MG tablet Commonly known as: JANUVIA Take by mouth.   spironolactone 25 MG tablet Commonly known as: ALDACTONE Take 0.5 tablets by mouth daily.            Durable Medical Equipment  (From admission, onward)         Start     Ordered   05/06/20 1153  For home use only DME 3 n 1  Once        05/06/20 1152   05/06/20 1152  For home use only DME Walker rolling  Once       Question Answer Comment  Walker: With 5 Inch Wheels   Patient needs a walker to treat with the following condition Weakness      05/06/20 1152          No Known Allergies  Consultations: None   Procedures/Studies: DG Chest 2 View  Result Date: 05/05/2020 CLINICAL DATA:  Cough. EXAM: CHEST - 2 VIEW COMPARISON:  None. FINDINGS: Mild,  diffuse, chronic appearing increased interstitial lung markings are seen. Mild linear atelectasis is noted within the right lung base. There is no evidence of a pleural effusion or pneumothorax. The cardiac silhouette is mildly enlarged. There is mild calcification of the aortic arch. The visualized skeletal structures are unremarkable. IMPRESSION: Chronic appearing increased interstitial lung markings with mild right basilar linear atelectasis. Electronically Signed   By: Aram Candela M.D.   On: 05/05/2020 20:07      Discharge Exam: Vitals:   05/06/20 1029 05/06/20 1212  BP:  125/65  Pulse: 84 74  Resp:  16  Temp:  98.4 F (36.9 C)  SpO2: 99% 97%  Vitals:   05/06/20 0530 05/06/20 0601 05/06/20 1029 05/06/20 1212  BP: 134/66 (!) 149/69  125/65  Pulse: 78 71 84 74  Resp: 17 18  16   Temp:  98.6 F (37 C)  98.4 F (36.9 C)  TempSrc:  Oral  Oral  SpO2: 98% 94% 99% 97%  Weight:  83.1 kg      General: Pt is alert, awake, not in acute distress Cardiovascular: RRR, S1/S2 +, no rubs, no gallops Respiratory: CTA bilaterally, no wheezing, no rhonchi Abdominal: Soft, NT, ND, bowel sounds + Extremities: no edema, no cyanosis    The results of significant diagnostics from this hospitalization (including imaging, microbiology, ancillary and laboratory) are listed below for reference.     Microbiology: Recent Results (from the past 240 hour(s))  Respiratory Panel by RT PCR (Flu A&B, Covid) - Nasopharyngeal Swab     Status: None   Collection Time: 05/05/20 11:11 PM   Specimen: Nasopharyngeal Swab; Nasopharyngeal(NP) swabs in vial transport medium  Result Value Ref Range Status   SARS Coronavirus 2 by RT PCR NEGATIVE NEGATIVE Final    Comment: (NOTE) SARS-CoV-2 target nucleic acids are NOT DETECTED.  The SARS-CoV-2 RNA is generally detectable in upper respiratoy specimens during the acute phase of infection. The lowest concentration of SARS-CoV-2 viral copies this assay can  detect is 131 copies/mL. A negative result does not preclude SARS-Cov-2 infection and should not be used as the sole basis for treatment or other patient management decisions. A negative result may occur with  improper specimen collection/handling, submission of specimen other than nasopharyngeal swab, presence of viral mutation(s) within the areas targeted by this assay, and inadequate number of viral copies (<131 copies/mL). A negative result must be combined with clinical observations, patient history, and epidemiological information. The expected result is Negative.  Fact Sheet for Patients:  05/07/20  Fact Sheet for Healthcare Providers:  https://www.moore.com/  This test is no t yet approved or cleared by the https://www.young.biz/ FDA and  has been authorized for detection and/or diagnosis of SARS-CoV-2 by FDA under an Emergency Use Authorization (EUA). This EUA will remain  in effect (meaning this test can be used) for the duration of the COVID-19 declaration under Section 564(b)(1) of the Act, 21 U.S.C. section 360bbb-3(b)(1), unless the authorization is terminated or revoked sooner.     Influenza A by PCR NEGATIVE NEGATIVE Final   Influenza B by PCR NEGATIVE NEGATIVE Final    Comment: (NOTE) The Xpert Xpress SARS-CoV-2/FLU/RSV assay is intended as an aid in  the diagnosis of influenza from Nasopharyngeal swab specimens and  should not be used as a sole basis for treatment. Nasal washings and  aspirates are unacceptable for Xpert Xpress SARS-CoV-2/FLU/RSV  testing.  Fact Sheet for Patients: [place]  Fact Sheet for Healthcare Providers: https://www.moore.com/  This test is not yet approved or cleared by the https://www.young.biz/ FDA and  has been authorized for detection and/or diagnosis of SARS-CoV-2 by  FDA under an Emergency Use Authorization (EUA). This EUA will remain  in  effect (meaning this test can be used) for the duration of the  Covid-19 declaration under Section 564(b)(1) of the Act, 21  U.S.C. section 360bbb-3(b)(1), unless the authorization is  terminated or revoked. Performed at [facility], [address], [place], [place]   Respiratory Panel by PCR     Status: Abnormal   Collection Time: 05/06/20 11:10 AM   Specimen: Nasopharyngeal Swab; Respiratory  Result Value Ref Range Status  Adenovirus NOT DETECTED NOT DETECTED Final   Coronavirus 229E NOT DETECTED NOT DETECTED Final    Comment: (NOTE) The Coronavirus on the Respiratory Panel, DOES NOT test for the novel  Coronavirus (2019 nCoV)    Coronavirus HKU1 NOT DETECTED NOT DETECTED Final   Coronavirus NL63 NOT DETECTED NOT DETECTED Final   Coronavirus OC43 NOT DETECTED NOT DETECTED Final   Metapneumovirus NOT DETECTED NOT DETECTED Final   Rhinovirus / Enterovirus DETECTED (A) NOT DETECTED Final   Influenza A NOT DETECTED NOT DETECTED Final   Influenza B NOT DETECTED NOT DETECTED Final   Parainfluenza Virus 1 NOT DETECTED NOT DETECTED Final   Parainfluenza Virus 2 NOT DETECTED NOT DETECTED Final   Parainfluenza Virus 3 NOT DETECTED NOT DETECTED Final   Parainfluenza Virus 4 NOT DETECTED NOT DETECTED Final   Respiratory Syncytial Virus NOT DETECTED NOT DETECTED Final   Bordetella pertussis NOT DETECTED NOT DETECTED Final   Chlamydophila pneumoniae NOT DETECTED NOT DETECTED Final   Mycoplasma pneumoniae NOT DETECTED NOT DETECTED Final    Comment: Performed at [facility], [address] [place], [place] [address]     Labs: BNP (last 3 results) No results for input(s): BNP in the last 8760 hours. Basic Metabolic Panel: Recent Labs  Lab 05/05/20 1928 05/06/20 0635  NA 138  --   K 3.9  --   CL 101  --   CO2 26  --   GLUCOSE 238*  --   BUN 15  --   CREATININE 0.98 0.80  CALCIUM 9.7  --   MG  --  1.8  PHOS  --  3.6   Liver Function Tests: Recent Labs   Lab 05/05/20 1928  AST 15  ALT 15  ALKPHOS 76  BILITOT 0.2*  PROT 7.9  ALBUMIN 4.1   No results for input(s): LIPASE, AMYLASE in the last 168 hours. No results for input(s): AMMONIA in the last 168 hours. CBC: Recent Labs  Lab 05/05/20 1928 05/06/20 0635  WBC 10.8* 8.4  NEUTROABS 8.4*  --   HGB 13.6 13.2  HCT 43.2 41.4  MCV 97.3 97.6  PLT 284 237   Cardiac Enzymes: No results for input(s): CKTOTAL, CKMB, CKMBINDEX, TROPONINI in the last 168 hours. BNP: Invalid input(s): POCBNP CBG: Recent Labs  Lab 05/06/20 0044 05/06/20 0624 05/06/20 1103  GLUCAP 174* 183* 270*   D-Dimer No results for input(s): DDIMER in the last 72 hours. Hgb A1c Recent Labs    05/06/20 0635  HGBA1C 6.2*   Lipid Profile No results for input(s): CHOL, HDL, LDLCALC, TRIG, CHOLHDL, LDLDIRECT in the last 72 hours. Thyroid function studies No results for input(s): TSH, T4TOTAL, T3FREE, THYROIDAB in the last 72 hours.  Invalid input(s): FREET3 Anemia work up No results for input(s): VITAMINB12, FOLATE, FERRITIN, TIBC, IRON, RETICCTPCT in the last 72 hours. Urinalysis    Component Value Date/Time   COLORURINE YELLOW 05/06/2020 0138   APPEARANCEUR CLEAR 05/06/2020 0138   LABSPEC 1.024 05/06/2020 0138   PHURINE 5.0 05/06/2020 0138   GLUCOSEU NEGATIVE 05/06/2020 0138   HGBUR NEGATIVE 05/06/2020 0138   BILIRUBINUR NEGATIVE 05/06/2020 0138   KETONESUR NEGATIVE 05/06/2020 0138   PROTEINUR 30 (A) 05/06/2020 0138   NITRITE NEGATIVE 05/06/2020 0138   LEUKOCYTESUR NEGATIVE 05/06/2020 0138   Sepsis Labs Invalid input(s): PROCALCITONIN,  WBC,  LACTICIDVEN Microbiology Recent Results (from the past 240 hour(s))  Respiratory Panel by RT PCR (Flu A&B, Covid) - Nasopharyngeal Swab     Status: None  Collection Time: 05/05/20 11:11 PM   Specimen: Nasopharyngeal Swab; Nasopharyngeal(NP) swabs in vial transport medium  Result Value Ref Range Status   SARS Coronavirus 2 by RT PCR NEGATIVE NEGATIVE  Final    Comment: (NOTE) SARS-CoV-2 target nucleic acids are NOT DETECTED.  The SARS-CoV-2 RNA is generally detectable in upper respiratoy specimens during the acute phase of infection. The lowest concentration of SARS-CoV-2 viral copies this assay can detect is 131 copies/mL. A negative result does not preclude SARS-Cov-2 infection and should not be used as the sole basis for treatment or other patient management decisions. A negative result may occur with  improper specimen collection/handling, submission of specimen other than nasopharyngeal swab, presence of viral mutation(s) within the areas targeted by this assay, and inadequate number of viral copies (<131 copies/mL). A negative result must be combined with clinical observations, patient history, and epidemiological information. The expected result is Negative.  Fact Sheet for Patients:  https://www.moore.com/  Fact Sheet for Healthcare Providers:  https://www.young.biz/  This test is no t yet approved or cleared by the [place] FDA and  has been authorized for detection and/or diagnosis of SARS-CoV-2 by FDA under an Emergency Use Authorization (EUA). This EUA will remain  in effect (meaning this test can be used) for the duration of the COVID-19 declaration under Section 564(b)(1) of the Act, 21 U.S.C. section 360bbb-3(b)(1), unless the authorization is terminated or revoked sooner.     Influenza A by PCR NEGATIVE NEGATIVE Final   Influenza B by PCR NEGATIVE NEGATIVE Final    Comment: (NOTE) The Xpert Xpress SARS-CoV-2/FLU/RSV assay is intended as an aid in  the diagnosis of influenza from Nasopharyngeal swab specimens and  should not be used as a sole basis for treatment. Nasal washings and  aspirates are unacceptable for Xpert Xpress SARS-CoV-2/FLU/RSV  testing.  Fact Sheet for Patients: https://www.moore.com/  Fact Sheet for Healthcare  Providers: https://www.young.biz/  This test is not yet approved or cleared by the [place] FDA and  has been authorized for detection and/or diagnosis of SARS-CoV-2 by  FDA under an Emergency Use Authorization (EUA). This EUA will remain  in effect (meaning this test can be used) for the duration of the  Covid-19 declaration under Section 564(b)(1) of the Act, 21  U.S.C. section 360bbb-3(b)(1), unless the authorization is  terminated or revoked. Performed at [facility], [address] [place], [place] [address]   Respiratory Panel by PCR     Status: Abnormal   Collection Time: 05/06/20 11:10 AM   Specimen: Nasopharyngeal Swab; Respiratory  Result Value Ref Range Status   Adenovirus NOT DETECTED NOT DETECTED Final   Coronavirus 229E NOT DETECTED NOT DETECTED Final    Comment: (NOTE) The Coronavirus on the Respiratory Panel, DOES NOT test for the novel  Coronavirus (2019 nCoV)    Coronavirus HKU1 NOT DETECTED NOT DETECTED Final   Coronavirus NL63 NOT DETECTED NOT DETECTED Final   Coronavirus OC43 NOT DETECTED NOT DETECTED Final   Metapneumovirus NOT DETECTED NOT DETECTED Final   Rhinovirus / Enterovirus DETECTED (A) NOT DETECTED Final   Influenza A NOT DETECTED NOT DETECTED Final   Influenza B NOT DETECTED NOT DETECTED Final   Parainfluenza Virus 1 NOT DETECTED NOT DETECTED Final   Parainfluenza Virus 2 NOT DETECTED NOT DETECTED Final   Parainfluenza Virus 3 NOT DETECTED NOT DETECTED Final   Parainfluenza Virus 4 NOT DETECTED NOT DETECTED Final   Respiratory Syncytial Virus NOT DETECTED NOT DETECTED Final   Bordetella  pertussis NOT DETECTED NOT DETECTED Final   Chlamydophila pneumoniae NOT DETECTED NOT DETECTED Final   Mycoplasma pneumoniae NOT DETECTED NOT DETECTED Final    Comment: Performed at [facility], [address] [place], [place] [address]     Time coordinating discharge: Over 30 minutes  SIGNED:   Hughie Closs,  MD  [facility] 05/06/2020, 1:55 PM  If 7PM-7AM, please contact night-coverage [email or web address]

## 2020-05-06 NOTE — Care Management CC44 (Signed)
Condition Code 44 Documentation Completed  Patient Details  Name: Theresa Mendez MRN: [id number] Date of Birth: [birth year]   Condition Code 44 given:  Yes Patient signature on Condition Code 44 notice:  Yes Documentation of 2 MD's agreement:  Yes Code 44 added to claim:  Yes    Kingsley Plan, RN 05/06/2020, 2:35 PM

## 2020-05-06 NOTE — TOC Initial Note (Addendum)
Transition of Care [facility]) - Initial/Assessment Note    Patient Details  Name: Theresa Mendez MRN: [id number] Date of Birth: [birth year]  Transition of Care [facility]) CM/SW Contact:    Kingsley Plan, RN Phone Number: 05/06/2020, 10:48 AM  Clinical Narrative:                 Spoke to patient and sister Dora at bedside.   Patient is from [facility] independent living.  PT/OT recommending SNF.   Discussed with sister [name] at bedside. [name] had planned to move her two sisters to a house on Nov29, 2021 and get caregivers for them. [name] asking if she can get caregivers through Medicare. NCM explained difference been personal care givers and home health.  Patient does not not have Medicaid , personnel care givers would be out of pocket. Dora asked NCM to call [facility] ( [phone])nto confirm. NCM called [facility] and confirmed with patient's insurance personnel care givers would be out of pocket.  Explained to [name] who voiced understanding.   [name] interested in [facility] for HHRN,PT,OT and aide. NCM explained aide would not be in the home frequently or for long periods of time. NCM made referral to [facility] with [facility] for HHRN,PT,OT and aide . Pearson Grippe will call Dora directly to discuss.  Will need home health orders   Patient will need walker and 3 in 1 for home. Entered orders , will call [facility] closer to discharge. NCM asked Dora if NCM could begin SNF work up just so she could see her options. [name] declined she prefers to take Ashley home.    PCP is with [facility] [phone] , NCM called and confirmed, patient is active with Dr Jake Samples with [facility] has accepted referral. Will need MD signed orders.   Ordered walker and 3 in 1 with [facility]    Code 44 given to sister [name].  Expected Discharge Plan: Home/Self Care     Patient Goals and CMS Choice Patient states their goals for this hospitalization and ongoing recovery are::  to return to home CMS Medicare.gov Compare Post Acute Care list provided to:: Patient    Expected Discharge Plan and Services Expected Discharge Plan: Home/Self Care       Living arrangements for the past 2 months: Apartment                 DME Arranged: N/A         HH Arranged: NA          Prior Living Arrangements/Services Living arrangements for the past 2 months: Apartment Lives with:: Self Patient language and need for interpreter reviewed:: Yes Do you feel safe going back to the place where you live?: Yes            Criminal Activity/Legal Involvement Pertinent to Current Situation/Hospitalization: No - Comment as needed  Activities of Daily Living      Permission Sought/Granted   Permission granted to share information with : Yes, Verbal Permission Granted  Share Information with NAME: sister [name]           Emotional Assessment   Attitude/Demeanor/Rapport: Engaged Affect (typically observed): Accepting        Admission diagnosis:  CAP (community acquired pneumonia) [J18.9] Community acquired pneumonia, unspecified laterality [J18.9] Patient Active Problem List   Diagnosis Date Noted  . CAP (community acquired pneumonia) 05/06/2020  . Hyperglycemia due to diabetes mellitus (  HCC) 05/06/2020  . Leukocytosis 05/06/2020  . Uncontrolled type 2 diabetes mellitus with complication, without long-term current use of insulin (HCC) 11/26/2015  . Hyperlipidemia associated with type 2 diabetes mellitus (HCC) 04/24/2014  . Hypertension associated with diabetes (HCC) 04/24/2014  . Anxiety and depression 03/21/2013  . Alzheimer's disease (HCC) 09/18/2012  . S/P colonoscopy with polypectomy 10/06/2011  . Spinal stenosis of lumbar region 07/29/2010  . LVH (left ventricular hypertrophy) 08/05/2009  . Vitamin D deficiency 08/05/2009  . DJD (degenerative joint disease), cervical 05/20/2009  . Stroke (HCC) 05/20/2009  . Breast calcification seen on mammogram  01/26/2009   PCP:  Patient, No Pcp Per Pharmacy:   [facility] [phone] [place], [place] - [address] [place] [address] Phone: [phone] Fax: [phone]     Social Determinants of Health (SDOH) Interventions    Readmission Risk Interventions No flowsheet data found.

## 2020-05-06 NOTE — H&P (Signed)
History and Physical  Gearldine Mendez [facility]MC:[id number] DOB: 08/03/1945 DOA: 05/05/2020  Referring physician: Leonette Monarch, MD PCP: Patient, No Pcp Per  Patient coming from: ALF  Chief Complaint: Cough, fever, elevated blood glucose level  HPI: Theresa Mendez is a 74 y.o. female with medical history significant for hypertension, diabetes mellitus, Alzheimer's disease who presents to the emergency department via EMS with cough and fever which started today.  Patient was unable to provide history at bedside due to being somnolent (though easily arousable).  History was obtained from ED physician and ED medical record.  Per report, patient presents with generalized weakness today, she typically walks with a cane, but today she collapsed just with standing.  Chest pain, shortness of breath, chest congestion, sore throat, loss of taste or smell or nausea or diarrhea was denied.  Patient was reported to have had Covid vaccine and booster.  ED Course:  In the department, was noted to be febrile with a temperature of 101.87F, BP on physical/74, but other vital signs were within normal range, O2 sat ranged within 93-100% on room air.  Work-up in the ED showed mild leukocytosis and hyperglycemia.  Lactic acid was negative, urinalysis was unimpressive for UTI.  Respiratory panel for influenza A, B and SARS coronavirus 2 was negative.  Chest x-ray showed chronic appearing increased interstitial lung markings with mild right basilar linear atelectasis.  EKG showed normal sinus rhythm at rate of 93 bpm.  She was started on IV ceftriaxone and Azithromycin for Presumed consequent pneumonia, Tylenol was given due to fever.  Hospitalist was asked to admit patient for further evaluation and management.  Review of Systems:  This cannot be obtained at this time due to patient being somnolent, though easily aroused  Past Medical History:  Diagnosis Date  . Alzheimer disease (HCC)   . Dementia (HCC)   . Diabetes mellitus  without complication (HCC)   . Hypertension    Past Surgical History:  Procedure Laterality Date  . ABDOMINAL HYSTERECTOMY    . COLON SURGERY      Social History:  reports that she has quit smoking. She has a 4.00 pack-year smoking history. She has never used smokeless tobacco. She reports that she does not drink alcohol and does not use drugs.   No Known Allergies  Family History  Problem Relation Age of Onset  . Dementia Mother   . Dementia Sister   . Dementia Maternal Grandmother      Prior to Admission medications   Medication Sig Start Date End Date Taking? Authorizing Provider  acetaminophen (TYLENOL) 325 MG tablet Take 650 mg by mouth every 6 (six) hours as needed for moderate pain.     [provider]  amLODipine (NORVASC) 2.5 MG tablet Take 2.5 mg by mouth daily.     [provider]  buPROPion (WELLBUTRIN) 75 MG tablet Take 75 mg by mouth daily. 10/22/19   [provider]  docusate sodium (COLACE) 100 MG capsule Take 100 mg by mouth daily.    [provider]  donepezil (ARICEPT) 10 MG tablet Take 1 tablet (10 mg total) by mouth at bedtime. 02/27/20   Van Clines, MD  hydrALAZINE (APRESOLINE) 100 MG tablet Take 100 mg by mouth 3 (three) times daily. 10/22/19   [provider]  lisinopril (PRINIVIL,ZESTRIL) 40 MG tablet Take 40 mg by mouth daily.    [provider]  memantine (NAMENDA XR) 14 MG CP24 24 hr capsule Take 1 capsule (14 mg total) by mouth  daily. 02/27/20   Van Clines, MD  metoprolol tartrate (LOPRESSOR) 100 MG tablet Take 100 mg by mouth daily. 10/22/19   [provider]  potassium chloride (KLOR-CON) 8 MEQ tablet Take by mouth.    [provider]  sitaGLIPtin (JANUVIA) 100 MG tablet Take by mouth. 06/07/18   [provider]  spironolactone (ALDACTONE) 25 MG tablet Take 0.5 tablets by mouth daily. 07/12/18   [provider]    Physical Exam: BP 120/70   Pulse 86    Temp 98.9 F (37.2 C) (Oral)   Resp 17   SpO2 98%   . General: 74 y.o. year-old female well developed well nourished in no acute distress.  Alert and oriented x3. Marland Kitchen HEENT: NCAT . Neck: Supple, trachea media . Cardiovascular: Regular rate and rhythm with no rubs or gallops.  No thyromegaly or JVD noted.  No lower extremity edema. 2/4 pulses in all 4 extremities. Marland Kitchen Respiratory: Clear to auscultation with no wheezes or rales. Good inspiratory effort. . Abdomen: Soft nontender nondistended with normal bowel sounds x4 quadrants. . Muskuloskeletal: No cyanosis, clubbing or edema noted bilaterally . Neuro: Sensation intact, easily arousable to voice commands.  Limited neurological exam due to patient being sleepy.   Marland Kitchen Psychiatry: Mood is appropriate for condition and setting          Labs on Admission:  Basic Metabolic Panel: Recent Labs  Lab 05/05/20 1928  NA 138  K 3.9  CL 101  CO2 26  GLUCOSE 238*  BUN 15  CREATININE 0.98  CALCIUM 9.7   Liver Function Tests: Recent Labs  Lab 05/05/20 1928  AST 15  ALT 15  ALKPHOS 76  BILITOT 0.2*  PROT 7.9  ALBUMIN 4.1   No results for input(s): LIPASE, AMYLASE in the last 168 hours. No results for input(s): AMMONIA in the last 168 hours. CBC: Recent Labs  Lab 05/05/20 1928  WBC 10.8*  NEUTROABS 8.4*  HGB 13.6  HCT 43.2  MCV 97.3  PLT 284   Cardiac Enzymes: No results for input(s): CKTOTAL, CKMB, CKMBINDEX, TROPONINI in the last 168 hours.  BNP (last 3 results) No results for input(s): BNP in the last 8760 hours.  ProBNP (last 3 results) No results for input(s): PROBNP in the last 8760 hours.  CBG: Recent Labs  Lab 05/06/20 0044  GLUCAP 174*    Radiological Exams on Admission: DG Chest 2 View  Result Date: 05/05/2020 CLINICAL DATA:  Cough. EXAM: CHEST - 2 VIEW COMPARISON:  None. FINDINGS: Mild, diffuse, chronic appearing increased interstitial lung markings are seen. Mild linear atelectasis is noted within the  right lung base. There is no evidence of a pleural effusion or pneumothorax. The cardiac silhouette is mildly enlarged. There is mild calcification of the aortic arch. The visualized skeletal structures are unremarkable. IMPRESSION: Chronic appearing increased interstitial lung markings with mild right basilar linear atelectasis. Electronically Signed   By: Aram Candela M.D.   On: 05/05/2020 20:07    EKG: I independently viewed the EKG done and my findings are as followed: Normal sinus rhythm at a rate of 93 bpm  Assessment/Plan Present on Admission: . CAP (community acquired pneumonia) . Hypertension associated with diabetes (HCC) . Anxiety and depression  Principal Problem:   CAP (community acquired pneumonia) Active Problems:   Anxiety and depression   Alzheimer's disease (HCC)   Hypertension associated with diabetes (HCC)   Hyperglycemia due to diabetes mellitus (HCC)   Leukocytosis  Presumed CAP POA Patient presented  with fever, cough, mild elevation of WBC.  Chest x-ray showed chronic appearing increased interstitial lung markings with mild right basilar linear atelectasis She was started on IV ceftriaxone and azithromycin, we shall continue with same at this time with plan to de-escalate based on procalcitonin, blood culture, sputum culture, urine Legionella and strep pneumo PORT/PSI of 84 points  indicating 0.9-2.8% mortality Continue Mucinex, incentive spirometry, flutter valve Blood culture and sputum culture pending  Leukocytosis possibly reactive vs infectious Continue treatment as per above  Questionable collapse Patient was reported to collapse while walking today, unfortunately, patient was sleepy and uncooperative with interview to clarify if she actually syncopized or had a fall from being weak Patient will be placed on telemetry at this time, pending clarification when more awake with plan to do further syncope work-up based on clarification Continue fall  precaution and neurochecks Continue PT/OT eval and treat  Essential hypertension Continue amlodipine, lisinopril, Lopressor  Hyperglycemia secondary to diabetes mellitus Continue insulin sliding scale and hypoglycemic protocol  Alzheimer's disease Continue Aricept and Namenda  Depression Continue Wellbutrin   DVT prophylaxis: Lovenox  Code Status: Full code  Family Communication: None at bedside  Disposition Plan:  Patient is from:                        ALF Anticipated DC to:                   ALF Anticipated DC date:               2-3 days Anticipated DC barriers:         Patient is unstable to be discharged at this time due to presumed CAP POA requiring inpatient management   Consults called: None  Admission status: Inpatient    Frankey Shown MD [facility]  05/06/2020, 5:28 AM

## 2020-05-06 NOTE — ED Provider Notes (Signed)
[facility] EMERGENCY DEPARTMENT Provider Note   CSN: [id number] Arrival date & time: 05/05/20  1914     History Chief Complaint  Patient presents with  . Hyperglycemia/Fatigue/ Cough    Theresa Mendez is a 74 y.o. female.  HPI      74yo female with history of alzheimer's dementia, DM, hypertension, presents with concern for generalized weakness, cough and fever.  Lives in independent living with help Today developed cough, fever, cough Denies chest pain/dyspnea No congestion/sore throat/loss of taste or smell/nausea/vomiting/diarrhea/abdominal pain Has had covid vaccine and booster Denies sick contacts Generalized weakness--typically walks with cane, today started to collapse just with standing.   Past Medical History:  Diagnosis Date  . Alzheimer disease (HCC)   . Dementia (HCC)   . Diabetes mellitus without complication (HCC)   . Hypertension     Patient Active Problem List   Diagnosis Date Noted  . CAP (community acquired pneumonia) 05/06/2020  . Uncontrolled type 2 diabetes mellitus with complication, without long-term current use of insulin (HCC) 11/26/2015  . Hyperlipidemia associated with type 2 diabetes mellitus (HCC) 04/24/2014  . Hypertension associated with diabetes (HCC) 04/24/2014  . Anxiety and depression 03/21/2013  . Dementia (HCC) 09/18/2012  . S/P colonoscopy with polypectomy 10/06/2011  . Spinal stenosis of lumbar region 07/29/2010  . LVH (left ventricular hypertrophy) 08/05/2009  . Vitamin D deficiency 08/05/2009  . DJD (degenerative joint disease), cervical 05/20/2009  . Stroke (HCC) 05/20/2009  . Breast calcification seen on mammogram 01/26/2009    Past Surgical History:  Procedure Laterality Date  . ABDOMINAL HYSTERECTOMY    . COLON SURGERY       OB History   No obstetric history on file.     Family History  Problem Relation Age of Onset  . Dementia Mother   . Dementia Sister   . Dementia Maternal  Grandmother     Social History   Tobacco Use  . Smoking status: Former Smoker    Packs/day: 0.50    Years: 8.00    Pack years: 4.00  . Smokeless tobacco: Never Used  Vaping Use  . Vaping Use: Never used  Substance Use Topics  . Alcohol use: No  . Drug use: No    Home Medications Prior to Admission medications   Medication Sig Start Date End Date Taking? Authorizing Provider  acetaminophen (TYLENOL) 325 MG tablet Take 650 mg by mouth every 6 (six) hours as needed for moderate pain.     [provider]  amLODipine (NORVASC) 2.5 MG tablet Take 2.5 mg by mouth daily.     [provider]  buPROPion (WELLBUTRIN) 75 MG tablet Take 75 mg by mouth daily. 10/22/19   [provider]  docusate sodium (COLACE) 100 MG capsule Take 100 mg by mouth daily.    [provider]  donepezil (ARICEPT) 10 MG tablet Take 1 tablet (10 mg total) by mouth at bedtime. 02/27/20   Van Clines, MD  hydrALAZINE (APRESOLINE) 100 MG tablet Take 100 mg by mouth 3 (three) times daily. 10/22/19   [provider]  lisinopril (PRINIVIL,ZESTRIL) 40 MG tablet Take 40 mg by mouth daily.    [provider]  memantine (NAMENDA XR) 14 MG CP24 24 hr capsule Take 1 capsule (14 mg total) by mouth daily. 02/27/20   Van Clines, MD  metoprolol tartrate (LOPRESSOR) 100 MG tablet Take 100 mg by mouth daily. 10/22/19   [provider]  potassium chloride (KLOR-CON) 8 MEQ tablet  Take by mouth.    [provider]  sitaGLIPtin (JANUVIA) 100 MG tablet Take by mouth. 06/07/18   [provider]  spironolactone (ALDACTONE) 25 MG tablet Take 0.5 tablets by mouth daily. 07/12/18   [provider]    Allergies    Patient has no known allergies.  Review of Systems   Review of Systems  Constitutional: Positive for activity change, appetite change, fatigue and fever.  HENT: Negative for sore throat.   Eyes: Negative for visual disturbance.    Respiratory: Positive for cough. Negative for shortness of breath.   Cardiovascular: Negative for chest pain.  Gastrointestinal: Negative for abdominal pain, diarrhea, nausea and vomiting.  Genitourinary: Negative for difficulty urinating.  Musculoskeletal: Positive for arthralgias. Negative for back pain and neck pain.  Skin: Negative for rash.  Neurological: Negative for syncope and headaches.    Physical Exam Updated Vital Signs BP 120/70   Pulse 86   Temp 98.8 F (37.1 C)   Resp 17   SpO2 98%   Physical Exam Vitals and nursing note reviewed.  Constitutional:      General: She is not in acute distress.    Appearance: She is well-developed. She is not diaphoretic.  HENT:     Head: Normocephalic and atraumatic.  Eyes:     Conjunctiva/sclera: Conjunctivae normal.  Cardiovascular:     Rate and Rhythm: Normal rate and regular rhythm.     Heart sounds: Normal heart sounds. No murmur heard.  No friction rub. No gallop.   Pulmonary:     Effort: Pulmonary effort is normal. No respiratory distress.     Breath sounds: Normal breath sounds. No wheezing or rales.  Abdominal:     General: There is no distension.     Palpations: Abdomen is soft.     Tenderness: There is no abdominal tenderness. There is no guarding.  Musculoskeletal:        General: No tenderness.     Cervical back: Normal range of motion.  Skin:    General: Skin is warm and dry.     Findings: No erythema or rash.  Neurological:     Mental Status: She is alert and oriented to person, place, and time.     ED Results / Procedures / Treatments   Labs (all labs ordered are listed, but only abnormal results are displayed) Labs Reviewed  CBC WITH DIFFERENTIAL/PLATELET - Abnormal; Notable for the following components:      Result Value   WBC 10.8 (*)    Neutro Abs 8.4 (*)    All other components within normal limits  COMPREHENSIVE METABOLIC PANEL - Abnormal; Notable for the following components:   Glucose,  Bld 238 (*)    Total Bilirubin 0.2 (*)    All other components within normal limits  URINALYSIS, ROUTINE W REFLEX MICROSCOPIC - Abnormal; Notable for the following components:   Protein, ur 30 (*)    All other components within normal limits  CBG MONITORING, ED - Abnormal; Notable for the following components:   Glucose-Capillary 174 (*)    All other components within normal limits  RESPIRATORY PANEL BY RT PCR (FLU A&B, COVID)  CULTURE, BLOOD (ROUTINE X 2)  CULTURE, BLOOD (ROUTINE X 2)  LACTIC ACID, PLASMA    EKG EKG Interpretation  Date/Time:  Tuesday May 05 2020 23:12:18 EST Ventricular Rate:  93 PR Interval:    QRS Duration: 88 QT Interval:  351 QTC Calculation: 437 R Axis:   43 Text Interpretation: Sinus  rhythm Ventricular premature complex Aberrant complex Left atrial enlargement Low voltage, precordial leads Borderline repolarization abnormality Confirmed by Ross Marcus ([id number]) on 05/06/2020 2:24:51 AM   Radiology DG Chest 2 View  Result Date: 05/05/2020 CLINICAL DATA:  Cough. EXAM: CHEST - 2 VIEW COMPARISON:  None. FINDINGS: Mild, diffuse, chronic appearing increased interstitial lung markings are seen. Mild linear atelectasis is noted within the right lung base. There is no evidence of a pleural effusion or pneumothorax. The cardiac silhouette is mildly enlarged. There is mild calcification of the aortic arch. The visualized skeletal structures are unremarkable. IMPRESSION: Chronic appearing increased interstitial lung markings with mild right basilar linear atelectasis. Electronically Signed   By: Aram Candela M.D.   On: 05/05/2020 20:07    Procedures Procedures (including critical care time)  Medications Ordered in ED Medications  cefTRIAXone (ROCEPHIN) 1 g in sodium chloride 0.9 % 100 mL IVPB (0 g Intravenous Stopped 05/06/20 0107)  azithromycin (ZITHROMAX) 500 mg in sodium chloride 0.9 % 250 mL IVPB (0 mg Intravenous Stopped 05/06/20 0222)    acetaminophen (TYLENOL) tablet 650 mg (650 mg Oral Given 05/06/20 0222)    ED Course  I have reviewed the triage vital signs and the nursing notes.  Pertinent labs & imaging results that were available during my care of the patient were reviewed by me and considered in my medical decision making (see chart for details).    MDM Rules/Calculators/A&P                           74yo female with history of alzheimer's dementia, DM, hypertension, presents with concern for generalized weakness, cough and fever.  Presents with fever, leukocytosis. Lactic acid WNL. Hyperglycemia without other acute abnormalities.  CXR with right basilar atelectasiss. Given combination of symptoms of cough, fever, concern for pneumonia clinically .  Given rocephin/azithromycin for CAP.  Given severe generalized weakness, will admit for further care. COVID testing negative.   Final Clinical Impression(s) / ED Diagnoses Final diagnoses:  Community acquired pneumonia, unspecified laterality    Rx / DC Orders ED Discharge Orders    None       Alvira Monday, MD 05/06/20 [phone]

## 2020-05-08 LAB — LEGIONELLA PNEUMOPHILA SEROGP 1 UR AG: L. pneumophila Serogp 1 Ur Ag: NEGATIVE

## 2020-05-11 LAB — CULTURE, BLOOD (ROUTINE X 2)
Culture: NO GROWTH
Culture: NO GROWTH
Special Requests: ADEQUATE

## 2020-05-19 ENCOUNTER — Ambulatory Visit (INDEPENDENT_AMBULATORY_CARE_PROVIDER_SITE_OTHER): Payer: Medicare Other | Admitting: Podiatry

## 2020-05-19 ENCOUNTER — Encounter: Payer: Self-pay | Admitting: Podiatry

## 2020-05-19 ENCOUNTER — Other Ambulatory Visit: Payer: Self-pay

## 2020-05-19 DIAGNOSIS — M2042 Other hammer toe(s) (acquired), left foot: Secondary | ICD-10-CM

## 2020-05-19 DIAGNOSIS — M2041 Other hammer toe(s) (acquired), right foot: Secondary | ICD-10-CM

## 2020-05-19 DIAGNOSIS — B351 Tinea unguium: Secondary | ICD-10-CM | POA: Diagnosis not present

## 2020-05-19 DIAGNOSIS — M79675 Pain in left toe(s): Secondary | ICD-10-CM | POA: Diagnosis not present

## 2020-05-19 DIAGNOSIS — M2011 Hallux valgus (acquired), right foot: Secondary | ICD-10-CM

## 2020-05-19 DIAGNOSIS — M79674 Pain in right toe(s): Secondary | ICD-10-CM

## 2020-05-19 DIAGNOSIS — E119 Type 2 diabetes mellitus without complications: Secondary | ICD-10-CM | POA: Diagnosis not present

## 2020-05-19 DIAGNOSIS — M2012 Hallux valgus (acquired), left foot: Secondary | ICD-10-CM

## 2020-05-19 NOTE — Patient Instructions (Addendum)
Recommend New Balance Sneakers 600 series or higher. ([employer] or [employer]).  Recommend Skechers with stretchable uppers and memory foam insoles. ([employer]rick's, M[employer]'s, Belk, [employer]).  Moisturizers: For normal skin: Moisturize feet once daily; do not apply between toes A.  CeraVe Daily Moisturizing Lotion B.  Vaseline Intensive Care Lotion C.  Lubriderm Lotion D.  Gold Bond Diabetic Foot Lotion E.  Eucerin Intensive Repair Moisturizing Lotion  For extremely dry, cracked feet: moisturize feet once daily; do not apply between toes A. CeraVe Healing Ointment B. Aquaphor Healing Ointment C. Vaseline Petroleum Healing Jelly   If you have problems reaching your feet: apply to feet once daily; do not apply between toes A.  Aquaphor Advanced Therapy Ointment Body Spray B.  Vaseline Intensive Care Spray Lotion Advanced Repair       Diabetes Mellitus and Foot Care Foot care is an important part of your health, especially when you have diabetes. Diabetes may cause you to have problems because of poor blood flow (circulation) to your feet and legs, which can cause your skin to:  Become thinner and drier.  Break more easily.  Heal more slowly.  Peel and crack. You may also have nerve damage (neuropathy) in your legs and feet, causing decreased feeling in them. This means that you may not notice minor injuries to your feet that could lead to more serious problems. Noticing and addressing any potential problems early is the best way to prevent future foot problems. How to care for your feet Foot hygiene  Wash your feet daily with warm water and mild soap. Do not use hot water. Then, pat your feet and the areas between your toes until they are completely dry. Do not soak your feet as this can dry your skin.  Trim your toenails straight across. Do not dig under them or around the cuticle. File the edges of your nails with an emery board or nail file.  Apply a moisturizing  lotion or petroleum jelly to the skin on your feet and to dry, brittle toenails. Use lotion that does not contain alcohol and is unscented. Do not apply lotion between your toes. Shoes and socks  Wear clean socks or stockings every day. Make sure they are not too tight. Do not wear knee-high stockings since they may decrease blood flow to your legs.  Wear shoes that fit properly and have enough cushioning. Always look in your shoes before you put them on to be sure there are no objects inside.  To break in new shoes, wear them for just a few hours a day. This prevents injuries on your feet. Wounds, scrapes, corns, and calluses  Check your feet daily for blisters, cuts, bruises, sores, and redness. If you cannot see the bottom of your feet, use a mirror or ask someone for help.  Do not cut corns or calluses or try to remove them with medicine.  If you find a minor scrape, cut, or break in the skin on your feet, keep it and the skin around it clean and dry. You may clean these areas with mild soap and water. Do not clean the area with peroxide, alcohol, or iodine.  If you have a wound, scrape, corn, or callus on your foot, look at it several times a day to make sure it is healing and not infected. Check for: ? Redness, swelling, or pain. ? Fluid or blood. ? Warmth. ? Pus or a bad smell. General instructions  Do not cross your legs.  This may decrease blood flow to your feet.  Do not use heating pads or hot water bottles on your feet. They may burn your skin. If you have lost feeling in your feet or legs, you may not know this is happening until it is too late.  Protect your feet from hot and cold by wearing shoes, such as at the beach or on hot pavement.  Schedule a complete foot exam at least once a year (annually) or more often if you have foot problems. If you have foot problems, report any cuts, sores, or bruises to your health care provider immediately. Contact a health care provider  if:  You have a medical condition that increases your risk of infection and you have any cuts, sores, or bruises on your feet.  You have an injury that is not healing.  You have redness on your legs or feet.  You feel burning or tingling in your legs or feet.  You have pain or cramps in your legs and feet.  Your legs or feet are numb.  Your feet always feel cold.  You have pain around a toenail. Get help right away if:  You have a wound, scrape, corn, or callus on your foot and: ? You have pain, swelling, or redness that gets worse. ? You have fluid or blood coming from the wound, scrape, corn, or callus. ? Your wound, scrape, corn, or callus feels warm to the touch. ? You have pus or a bad smell coming from the wound, scrape, corn, or callus. ? You have a fever. ? You have a red line going up your leg. Summary  Check your feet every day for cuts, sores, red spots, swelling, and blisters.  Moisturize feet and legs daily.  Wear shoes that fit properly and have enough cushioning.  If you have foot problems, report any cuts, sores, or bruises to your health care provider immediately.  Schedule a complete foot exam at least once a year (annually) or more often if you have foot problems. This information is not intended to replace advice given to you by your health care provider. Make sure you discuss any questions you have with your health care provider. Document Revised: 02/20/2019 Document Reviewed: 07/01/2016 Elsevier Patient Education  2020 Elsevier Inc.   Bunion  A bunion is a bump on the base of the big toe that forms when the bones of the big toe joint move out of position. Bunions may be small at first, but they often get larger over time. They can make walking painful. What are the causes? A bunion may be caused by:  Wearing narrow or pointed shoes that force the big toe to press against the other toes.  Abnormal foot development that causes the foot to roll  inward (pronate).  Changes in the foot that are caused by certain diseases, such as rheumatoid arthritis or polio.  A foot injury. What increases the risk? The following factors may make you more likely to develop this condition:  Wearing shoes that squeeze the toes together.  Having certain diseases, such as: ? Rheumatoid arthritis. ? Polio. ? Cerebral palsy.  Having family members who have bunions.  Being born with a foot deformity, such as flat feet or low arches.  Doing activities that put a lot of pressure on the feet, such as ballet dancing. What are the signs or symptoms? The main symptom of a bunion is a noticeable bump on the big toe. Other symptoms may include:  Pain.  Swelling around the big toe.  Redness and inflammation.  Thick or hardened skin on the big toe or between the toes.  Stiffness or loss of motion in the big toe.  Trouble with walking. How is this diagnosed? A bunion may be diagnosed based on your symptoms, medical history, and activities. You may have tests, such as:  X-rays. These allow your health care provider to check the position of the bones in your foot and look for damage to your joint. They also help your health care provider determine the severity of your bunion and the best way to treat it.  Joint aspiration. In this test, a sample of fluid is removed from the toe joint. This test may be done if you are in a lot of pain. It helps rule out diseases that cause painful swelling of the joints, such as arthritis. How is this treated? Treatment depends on the severity of your symptoms. The goal of treatment is to relieve symptoms and prevent the bunion from getting worse. Your health care provider may recommend:  Wearing shoes that have a wide toe box.  Using bunion pads to cushion the affected area.  Taping your toes together to keep them in a normal position.  Placing a device inside your shoe (orthotics) to help reduce pressure on your  toe joint.  Taking medicine to ease pain, inflammation, and swelling.  Applying heat or ice to the affected area.  Doing stretching exercises.  Surgery to remove scar tissue and move the toes back into their normal position. This treatment is rare. Follow these instructions at home: Managing pain, stiffness, and swelling   If directed, put ice on the painful area: ? Put ice in a plastic bag. ? Place a towel between your skin and the bag. ? Leave the ice on for 20 minutes, 2-3 times a day. Activity   If directed, apply heat to the affected area before you exercise. Use the heat source that your health care provider recommends, such as a moist heat pack or a heating pad. ? Place a towel between your skin and the heat source. ? Leave the heat on for 20-30 minutes. ? Remove the heat if your skin turns bright red. This is especially important if you are unable to feel pain, heat, or cold. You may have a greater risk of getting burned.  Do exercises as told by your health care provider. General instructions  Support your toe joint with proper footwear, shoe padding, or taping as told by your health care provider.  Take over-the-counter and prescription medicines only as told by your health care provider.  Keep all follow-up visits as told by your health care provider. This is important. Contact a health care provider if your symptoms:  Get worse.  Do not improve in 2 weeks. Get help right away if you have:  Severe pain and trouble with walking. Summary  A bunion is a bump on the base of the big toe that forms when the bones of the big toe joint move out of position.  Bunions can make walking painful.  Treatment depends on the severity of your symptoms.  Support your toe joint with proper footwear, shoe padding, or taping as told by your health care provider. This information is not intended to replace advice given to you by your health care provider. Make sure you discuss  any questions you have with your health care provider. Document Revised: 12/04/2017 Document Reviewed: 10/10/2017 Elsevier Patient Education  2020 Elsevier Inc.   Hammer Toe  Hammer toe is a change in the shape (a deformity) of your toe. The deformity causes the middle joint of your toe to stay bent. This causes pain, especially when you are wearing shoes. Hammer toe starts gradually. At first, the toe can be straightened. Gradually over time, the deformity becomes stiff and permanent. Early treatments to keep the toe straight may relieve pain. As the deformity becomes stiff and permanent, surgery may be needed to straighten the toe. What are the causes? Hammer toe is caused by abnormal bending of the toe joint that is closest to your foot. It happens gradually over time. This pulls on the muscles and connections (tendons) of the toe joint, making them weak and stiff. It is often related to wearing shoes that are too short or narrow and do not let your toes straighten. What increases the risk? You may be at greater risk for hammer toe if you:  Are female.  Are older.  Wear shoes that are too small.  Wear high-heeled shoes that pinch your toes.  Are a Advertising account planner.  Have a second toe that is longer than your big toe (first toe).  Injure your foot or toe.  Have arthritis.  Have a family history of hammer toe.  Have a nerve or muscle disorder. What are the signs or symptoms? The main symptoms of this condition are pain and deformity of the toe. The pain is worse when wearing shoes, walking, or running. Other symptoms may include:  Corns or calluses over the bent part of the toe or between the toes.  Redness and a burning feeling on the toe.  An open sore that forms on the top of the toe.  Not being able to straighten the toe. How is this diagnosed? This condition is diagnosed based on your symptoms and a physical exam. During the exam, your health care provider will try to  straighten your toe to see how stiff the deformity is. You may also have tests, such as:  A blood test to check for rheumatoid arthritis.  An X-ray to show how severe the deformity is. How is this treated? Treatment for this condition will depend on how stiff the deformity is. Surgery is often needed. However, sometimes a hammer toe can be straightened without surgery. Treatments that do not involve surgery include:  Taping the toe into a straightened position.  Using pads and cushions to protect the toe (orthotics).  Wearing shoes that provide enough room for the toes.  Doing toe-stretching exercises at home.  Taking an NSAID to reduce pain and swelling. If these treatments do not help or the toe cannot be straightened, surgery is the next option. The most common surgeries used to straighten a hammer toe include:  Arthroplasty. In this procedure, part of the joint is removed, and that allows the toe to straighten.  Fusion. In this procedure, cartilage between the two bones of the joint is taken out and the bones are fused together into one longer bone.  Implantation. In this procedure, part of the bone is removed and replaced with an implant to let the toe move again.  Flexor tendon transfer. In this procedure, the tendons that curl the toes down (flexor tendons) are repositioned. Follow these instructions at home:  Take over-the-counter and prescription medicines only as told by your health care provider.  Do toe straightening and stretching exercises as told by your health care provider.  Keep all follow-up visits as  told by your health care provider. This is important. How is this prevented?  Wear shoes that give your toes enough room and do not cause pain.  Do not wear high-heeled shoes. Contact a health care provider if:  Your pain gets worse.  Your toe becomes red or swollen.  You develop an open sore on your toe. This information is not intended to replace advice  given to you by your health care provider. Make sure you discuss any questions you have with your health care provider. Document Revised: 05/12/2017 Document Reviewed: 09/23/2015 Elsevier Patient Education  2020 [employer].

## 2020-05-24 NOTE — Progress Notes (Signed)
ANNUAL DIABETIC FOOT EXAM  Subjective: Theresa Mendez presents today for for diabetic foot evaluation, for annual diabetic foot examination and painful thick toenails that are difficult to trim. Pain interferes with ambulation. Aggravating factors include wearing enclosed shoe gear. Pain is relieved with periodic professional debridement..  Patient relates >20 year h/o diabetes.  Patient denies h/o foot wounds.  Patient denies symptoms of foot numbness.  Patient has occasional symptoms of foot tingling.  Patient denies symptoms of burning in feet.  Patient did not check blood glucose this morning. She has run out of testing strips, but sister says sugars run in the 120's.  Theresa Aldo, NP is patient's PCP.   Past Medical History:  Diagnosis Date  . Alzheimer disease (HCC)   . Dementia (HCC)   . Diabetes mellitus without complication (HCC)   . Hypertension    Patient Active Problem List   Diagnosis Date Noted  . CAP (community acquired pneumonia) 05/06/2020  . Hyperglycemia due to diabetes mellitus (HCC) 05/06/2020  . Leukocytosis 05/06/2020  . Viral pneumonia 05/06/2020  . Uncontrolled type 2 diabetes mellitus with complication, without long-term current use of insulin (HCC) 11/26/2015  . Hyperlipidemia associated with type 2 diabetes mellitus (HCC) 04/24/2014  . Hypertension associated with diabetes (HCC) 04/24/2014  . Anxiety and depression 03/21/2013  . Alzheimer's disease (HCC) 09/18/2012  . S/P colonoscopy with polypectomy 10/06/2011  . Spinal stenosis of lumbar region 07/29/2010  . LVH (left ventricular hypertrophy) 08/05/2009  . Vitamin D deficiency 08/05/2009  . DJD (degenerative joint disease), cervical 05/20/2009  . Stroke (HCC) 05/20/2009  . Breast calcification seen on mammogram 01/26/2009   Past Surgical History:  Procedure Laterality Date  . ABDOMINAL HYSTERECTOMY    . COLON SURGERY     Current Outpatient Medications on File Prior to Visit  Medication  Sig Dispense Refill  . acetaminophen (TYLENOL) 325 MG tablet Take 650 mg by mouth every 6 (six) hours as needed for moderate pain.     Marland Kitchen amLODipine (NORVASC) 2.5 MG tablet Take 2.5 mg by mouth daily.     Marland Kitchen buPROPion (WELLBUTRIN) 75 MG tablet Take 75 mg by mouth daily.    . chlorhexidine (PERIDEX) 0.12 % solution SMARTSIG:By Mouth    . DERMA-SMOOTHE/FS SCALP 0.01 % OIL SMARTSIG:1 Sparingly Topical Once a Week PRN    . docusate sodium (COLACE) 100 MG capsule Take 100 mg by mouth daily.    Marland Kitchen donepezil (ARICEPT) 10 MG tablet Take 1 tablet (10 mg total) by mouth at bedtime. 90 tablet 3  . DULoxetine (CYMBALTA) 30 MG capsule Take by mouth.    . hydrALAZINE (APRESOLINE) 100 MG tablet Take 100 mg by mouth 3 (three) times daily.    . hydrOXYzine (ATARAX/VISTARIL) 10 MG tablet Take 10 mg by mouth at bedtime.    Marland Kitchen lisinopril (PRINIVIL,ZESTRIL) 40 MG tablet Take 40 mg by mouth daily.    . memantine (NAMENDA XR) 14 MG CP24 24 hr capsule Take 1 capsule (14 mg total) by mouth daily. 90 capsule 3  . metFORMIN (GLUCOPHAGE) 500 MG tablet Take 500 mg by mouth 2 (two) times daily.    . metoprolol tartrate (LOPRESSOR) 100 MG tablet Take 100 mg by mouth daily.    . metoprolol tartrate (LOPRESSOR) 100 MG tablet Take by mouth.    . naproxen sodium (ANAPROX) 550 MG tablet Take 550 mg by mouth 2 (two) times daily.    . potassium chloride (KLOR-CON) 8 MEQ tablet Take by mouth.    . sitaGLIPtin (JANUVIA)  100 MG tablet Take by mouth.    . spironolactone (ALDACTONE) 25 MG tablet Take 0.5 tablets by mouth daily.    Marland Kitchen triamcinolone (KENALOG) 0.1 % SMARTSIG:1 Application Topical 2-3 Times Daily     No current facility-administered medications on file prior to visit.    No Known Allergies Social History   Occupational History  . Not on file  Tobacco Use  . Smoking status: Former Smoker    Packs/day: 0.50    Years: 8.00    Pack years: 4.00  . Smokeless tobacco: Never Used  Vaping Use  . Vaping Use: Never used   Substance and Sexual Activity  . Alcohol use: No  . Drug use: No  . Sexual activity: Not on file   Family History  Problem Relation Age of Onset  . Dementia Mother   . Dementia Sister   . Dementia Maternal Grandmother     There is no immunization history on file for this patient.   Review of Systems: Negative except as noted in the HPI.   Objective: There were no vitals filed for this visit.  Julyanna Mendez is a pleasant 74 y.o. female in NAD. AAO X 3.  Vascular Examination: Capillary refill time to digits immediate b/l. Palpable DP pulse(s) b/l lower extremities Faintly palpable PT pulse(s) b/l lower extremities. Pedal hair present. Lower extremity skin temperature gradient within normal limits.  Dermatological Examination: Pedal skin with normal turgor, texture and tone bilaterally. No open wounds bilaterally. No interdigital macerations bilaterally. Toenails 1-5 b/l elongated, discolored, dystrophic, thickened, crumbly with subungual debris and tenderness to dorsal palpation.  Musculoskeletal Examination: Normal muscle strength 5/5 to all lower extremity muscle groups bilaterally. No pain crepitus or joint limitation noted with ROM b/l. 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 Hammertoes noted to the 2-5 bilaterally.  Footwear Assessment: Does the patient wear appropriate shoes? Yes.. Does the patient need inserts/orthotics? No.  Neurological Examination: Protective sensation intact 5/5 intact bilaterally with 10g monofilament b/l. Vibratory sensation intact b/l. Clonus negative b/l.  Hemoglobin A1C Latest Ref Rng & Units 05/06/2020  HGBA1C 4.8 - 5.6 % 6.2(H)  Some recent data might be hidden   Assessment: 1. Pain due to onychomycosis of toenails of both feet   2. Hallux valgus, acquired, bilateral   3. Acquired hammertoes of both feet   4. Controlled type 2 diabetes mellitus without complication, without long-term current use of insulin (HCC)    5. Encounter for diabetic foot exam (HCC)     ADA Risk Categorization: Low Risk :  Patient has all of the following: Intact protective sensation No prior foot ulcer  No severe deformity Pedal pulses present  Plan: -Examined patient. -Diabetic foot examination performed on today's visit. -Continue diabetic foot care principles. -Patient to continue soft, supportive shoe gear daily. Shoe recommendations given on today's visit. -List of moisturizers given on today's visit. -Toenails 1-5 b/l were debrided in length and girth with sterile nail nippers and dremel without iatrogenic bleeding.  -Patient to report any pedal injuries to medical professional immediately. -Patient/POA to call should there be question/concern in the interim.  Return in about 3 months (around 08/17/2020).  Freddie Breech, DPM

## 2020-06-22 ENCOUNTER — Emergency Department (HOSPITAL_COMMUNITY)
Admission: EM | Admit: 2020-06-22 | Discharge: 2020-06-23 | Disposition: A | Discharge status: Home / Self Care | Payer: Medicare Other | Attending: Emergency Medicine | Admitting: Emergency Medicine

## 2020-06-22 ENCOUNTER — Other Ambulatory Visit: Payer: Self-pay

## 2020-06-22 ENCOUNTER — Encounter (HOSPITAL_COMMUNITY): Payer: Self-pay | Admitting: Emergency Medicine

## 2020-06-22 DIAGNOSIS — Z87891 Personal history of nicotine dependence: Secondary | ICD-10-CM | POA: Insufficient documentation

## 2020-06-22 DIAGNOSIS — I10 Essential (primary) hypertension: Secondary | ICD-10-CM | POA: Diagnosis not present

## 2020-06-22 DIAGNOSIS — E1165 Type 2 diabetes mellitus with hyperglycemia: Secondary | ICD-10-CM | POA: Insufficient documentation

## 2020-06-22 DIAGNOSIS — Z79899 Other long term (current) drug therapy: Secondary | ICD-10-CM | POA: Insufficient documentation

## 2020-06-22 DIAGNOSIS — Z7984 Long term (current) use of oral hypoglycemic drugs: Secondary | ICD-10-CM | POA: Insufficient documentation

## 2020-06-22 DIAGNOSIS — M545 Low back pain, unspecified: Secondary | ICD-10-CM | POA: Insufficient documentation

## 2020-06-22 LAB — COMPREHENSIVE METABOLIC PANEL
ALT: 15 U/L (ref 0–44)
AST: 20 U/L (ref 15–41)
Albumin: 4 g/dL (ref 3.5–5.0)
Alkaline Phosphatase: 59 U/L (ref 38–126)
Anion gap: 13 (ref 5–15)
BUN: 17 mg/dL (ref 8–23)
CO2: 20 mmol/L — ABNORMAL LOW (ref 22–32)
Calcium: 9.1 mg/dL (ref 8.9–10.3)
Chloride: 106 mmol/L (ref 98–111)
Creatinine, Ser: 0.93 mg/dL (ref 0.44–1.00)
GFR, Estimated: >60 mL/min (ref >60)
Glucose, Bld: 136 mg/dL — ABNORMAL HIGH (ref 70–99)
Potassium: 3.7 mmol/L (ref 3.5–5.1)
Sodium: 139 mmol/L (ref 135–145)
Total Bilirubin: <0.1 mg/dL — ABNORMAL LOW (ref 0.3–1.2)
Total Protein: 7.4 g/dL (ref 6.5–8.1)

## 2020-06-22 LAB — CBC WITH DIFFERENTIAL/PLATELET
Abs Immature Granulocytes: 0.02 10*3/uL (ref 0.00–0.07)
Basophils Absolute: 0 10*3/uL (ref 0.0–0.1)
Basophils Relative: 0 %
Eosinophils Absolute: 0 10*3/uL (ref 0.0–0.5)
Eosinophils Relative: 0 %
HCT: 43 % (ref 36.0–46.0)
Hemoglobin: 13.6 g/dL (ref 12.0–15.0)
Immature Granulocytes: 0 %
Lymphocytes Relative: 20 %
Lymphs Abs: 1.1 10*3/uL (ref 0.7–4.0)
MCH: 30.4 pg (ref 26.0–34.0)
MCHC: 31.6 g/dL (ref 30.0–36.0)
MCV: 96.2 fL (ref 80.0–100.0)
Monocytes Absolute: 0.3 10*3/uL (ref 0.1–1.0)
Monocytes Relative: 6 %
Neutro Abs: 3.9 10*3/uL (ref 1.7–7.7)
Neutrophils Relative %: 74 %
Platelets: 204 10*3/uL (ref 150–400)
RBC: 4.47 MIL/uL (ref 3.87–5.11)
RDW: 11.8 % (ref 11.5–15.5)
WBC: 5.4 10*3/uL (ref 4.0–10.5)
nRBC: 0 % (ref 0.0–0.2)

## 2020-06-22 NOTE — ED Notes (Signed)
Please call Clinton Wahlberg (son) [phone] with patient status.

## 2020-06-22 NOTE — ED Notes (Signed)
Theresa Mendez - Sister [phone] Davis Gourd [phone]

## 2020-06-22 NOTE — ED Triage Notes (Signed)
Pt transported from home by [facility] for generalized weakness, worse last 1-2 hours. A & O at baseline. VSS.

## 2020-06-22 NOTE — ED Notes (Signed)
Please call daughter, Mar Daring, [phone], with patient status for both mother and father who were brought in together.

## 2020-06-23 NOTE — ED Notes (Signed)
Dora(Sister#[phone]) called for an update on patient's status and would like to come visit.  Thank you

## 2020-06-23 NOTE — ED Notes (Signed)
ED Provider at bedside. 

## 2020-06-23 NOTE — Discharge Instructions (Addendum)
Vital signs today are reassuring.  Your blood work is also reassuring with no signs of infection, normal kidney function and liver function. Recommend follow-up with your primary care provider.  Return to the ED for worsening or concerning symptoms.

## 2020-06-23 NOTE — ED Provider Notes (Signed)
[facility] EMERGENCY DEPARTMENT Provider Note   CSN: [id number] Arrival date & time: 06/22/20  1954     History Chief Complaint  Patient presents with  . Weakness  . Gen. Weakness/Fatigue    Theresa Mendez is a 75 y.o. female.  75 year old female with past history of Alzheimer's disease and dementia as well as diabetes and hypertension brought in by EMS from home.  Patient sister states that she felt like the patient was lethargic and sweaty, checked her blood pressure and her blood pressure was low so she called EMS to bring patient to the ER for evaluation.  Patient reports having back pain, her sister states that this does chronic and ongoing for her.  No other complaints or concerns today.        Past Medical History:  Diagnosis Date  . Alzheimer disease (HCC)   . Dementia (HCC)   . Diabetes mellitus without complication (HCC)   . Hypertension     Patient Active Problem List   Diagnosis Date Noted  . CAP (community acquired pneumonia) 05/06/2020  . Hyperglycemia due to diabetes mellitus (HCC) 05/06/2020  . Leukocytosis 05/06/2020  . Viral pneumonia 05/06/2020  . Uncontrolled type 2 diabetes mellitus with complication, without long-term current use of insulin (HCC) 11/26/2015  . Hyperlipidemia associated with type 2 diabetes mellitus (HCC) 04/24/2014  . Hypertension associated with diabetes (HCC) 04/24/2014  . Anxiety and depression 03/21/2013  . Alzheimer's disease (HCC) 09/18/2012  . S/P colonoscopy with polypectomy 10/06/2011  . Spinal stenosis of lumbar region 07/29/2010  . LVH (left ventricular hypertrophy) 08/05/2009  . Vitamin D deficiency 08/05/2009  . DJD (degenerative joint disease), cervical 05/20/2009  . Stroke (HCC) 05/20/2009  . Breast calcification seen on mammogram 01/26/2009    Past Surgical History:  Procedure Laterality Date  . ABDOMINAL HYSTERECTOMY    . COLON SURGERY       OB History   No obstetric history on file.      Family History  Problem Relation Age of Onset  . Dementia Mother   . Dementia Sister   . Dementia Maternal Grandmother     Social History   Tobacco Use  . Smoking status: Former Smoker    Packs/day: 0.50    Years: 8.00    Pack years: 4.00  . Smokeless tobacco: Never Used  Vaping Use  . Vaping Use: Never used  Substance Use Topics  . Alcohol use: No  . Drug use: No    Home Medications Prior to Admission medications   Medication Sig Start Date End Date Taking? Authorizing Provider  acetaminophen (TYLENOL) 325 MG tablet Take 650 mg by mouth every 6 (six) hours as needed for moderate pain.     [provider]  amLODipine (NORVASC) 2.5 MG tablet Take 2.5 mg by mouth daily.     [provider]  buPROPion (WELLBUTRIN) 75 MG tablet Take 75 mg by mouth daily. 10/22/19   [provider]  chlorhexidine (PERIDEX) 0.12 % solution SMARTSIG:By Mouth 03/20/20   [provider]  DERMA-SMOOTHE/FS SCALP 0.01 % OIL SMARTSIG:1 Sparingly Topical Once a Week PRN 03/17/20   [provider]  docusate sodium (COLACE) 100 MG capsule Take 100 mg by mouth daily.    [provider]  donepezil (ARICEPT) 10 MG tablet Take 1 tablet (10 mg total) by mouth at bedtime. 02/27/20   Van Clines, MD  DULoxetine (CYMBALTA) 30 MG capsule Take by mouth. 04/13/20   [provider]  hydrALAZINE (  APRESOLINE) 100 MG tablet Take 100 mg by mouth 3 (three) times daily. 10/22/19   [provider]  hydrOXYzine (ATARAX/VISTARIL) 10 MG tablet Take 10 mg by mouth at bedtime. 03/17/20   [provider]  lisinopril (PRINIVIL,ZESTRIL) 40 MG tablet Take 40 mg by mouth daily.    [provider]  memantine (NAMENDA XR) 14 MG CP24 24 hr capsule Take 1 capsule (14 mg total) by mouth daily. 02/27/20   Van Clines, MD  metFORMIN (GLUCOPHAGE) 500 MG tablet Take 500 mg by mouth 2 (two) times daily. 03/17/20   [provider]  metoprolol  tartrate (LOPRESSOR) 100 MG tablet Take 100 mg by mouth daily. 10/22/19   [provider]  metoprolol tartrate (LOPRESSOR) 100 MG tablet Take by mouth. 04/29/20   [provider]  naproxen sodium (ANAPROX) 550 MG tablet Take 550 mg by mouth 2 (two) times daily. 03/20/20   [provider]  potassium chloride (KLOR-CON) 8 MEQ tablet Take by mouth.    [provider]  sitaGLIPtin (JANUVIA) 100 MG tablet Take by mouth. 06/07/18   [provider]  spironolactone (ALDACTONE) 25 MG tablet Take 0.5 tablets by mouth daily. 07/12/18   [provider]  triamcinolone (KENALOG) 0.1 % SMARTSIG:1 Application Topical 2-3 Times Daily 03/17/20   [provider]    Allergies    Patient has no known allergies.  Review of Systems   Review of Systems  Unable to perform ROS: Dementia    Physical Exam Updated Vital Signs BP 117/62 (BP Location: Right Arm)   Pulse 62   Temp 97.9 F (36.6 C) (Oral)   Resp 16   SpO2 98%   Physical Exam Vitals and nursing note reviewed.  Constitutional:      General: She is not in acute distress.    Appearance: She is well-developed and well-nourished. She is not diaphoretic.  HENT:     Head: Normocephalic and atraumatic.     Mouth/Throat:     Mouth: Mucous membranes are moist.  Eyes:     Conjunctiva/sclera: Conjunctivae normal.  Cardiovascular:     Rate and Rhythm: Normal rate and regular rhythm.     Pulses: Normal pulses.     Heart sounds: Normal heart sounds.  Pulmonary:     Effort: Pulmonary effort is normal.     Breath sounds: Normal breath sounds.  Abdominal:     Palpations: Abdomen is soft.     Tenderness: There is no abdominal tenderness.  Musculoskeletal:        General: No swelling, tenderness or deformity.     Cervical back: Neck supple.  Skin:    General: Skin is warm and dry.     Findings: No erythema or rash.  Neurological:     Mental Status: She is alert. Mental status is at baseline.   Psychiatric:        Mood and Affect: Mood and affect normal.        Behavior: Behavior normal.     ED Results / Procedures / Treatments   Labs (all labs ordered are listed, but only abnormal results are displayed) Labs Reviewed  COMPREHENSIVE METABOLIC PANEL - Abnormal; Notable for the following components:      Result Value   CO2 20 (*)    Glucose, Bld 136 (*)    Total Bilirubin <0.1 (*)    All other components within normal limits  CBC WITH DIFFERENTIAL/PLATELET    EKG None  Radiology No results found.  Procedures Procedures (including critical care time)  Medications Ordered in ED Medications - No data to display  ED Course  I have reviewed the triage vital signs and the nursing notes.  Pertinent labs & imaging results that were available during my care of the patient were reviewed by me and considered in my medical decision making (see chart for details).  Clinical Course as of 06/23/20 1455  Tue Jun 23, 2020  [id number] 75 year old female with above complaint.  On exam patient is alert and pleasant reporting back pain without report of falls or injury.  Exam is unremarkable, there is no tenderness on palpation of her back.  Patient had extensive wait in the lobby today, her vitals have been reassuring throughout.  Her CBC and CMP are without significant findings.  Discussed results with patient's sister, patient seems to be at baseline and is ready for discharge home.  Advised follow-up with PCP as needed return to ED for worsening or concerning symptoms. [LM]    Clinical Course User Index [LM] Alden Hipp   MDM Rules/Calculators/A&P                         Final Clinical Impression(s) / ED Diagnoses Final diagnoses:  Low back pain without sciatica, unspecified back pain laterality, unspecified chronicity    Rx / DC Orders ED Discharge Orders    None       Jeannie Fend, PA-C 06/23/20 1455    Milagros Loll, MD 06/24/20 [phone]

## 2020-06-24 ENCOUNTER — Other Ambulatory Visit: Payer: Self-pay

## 2020-06-24 ENCOUNTER — Emergency Department (HOSPITAL_COMMUNITY)
Admission: EM | Admit: 2020-06-24 | Discharge: 2020-06-24 | Disposition: A | Discharge status: Home / Self Care | Payer: Medicare Other | Attending: Emergency Medicine | Admitting: Emergency Medicine

## 2020-06-24 DIAGNOSIS — Z87891 Personal history of nicotine dependence: Secondary | ICD-10-CM | POA: Diagnosis not present

## 2020-06-24 DIAGNOSIS — R531 Weakness: Secondary | ICD-10-CM

## 2020-06-24 DIAGNOSIS — E1165 Type 2 diabetes mellitus with hyperglycemia: Secondary | ICD-10-CM | POA: Insufficient documentation

## 2020-06-24 DIAGNOSIS — F0280 Dementia in other diseases classified elsewhere without behavioral disturbance: Secondary | ICD-10-CM | POA: Insufficient documentation

## 2020-06-24 DIAGNOSIS — Z79899 Other long term (current) drug therapy: Secondary | ICD-10-CM | POA: Diagnosis not present

## 2020-06-24 DIAGNOSIS — G309 Alzheimer's disease, unspecified: Secondary | ICD-10-CM | POA: Diagnosis not present

## 2020-06-24 DIAGNOSIS — U071 COVID-19: Secondary | ICD-10-CM | POA: Insufficient documentation

## 2020-06-24 DIAGNOSIS — I10 Essential (primary) hypertension: Secondary | ICD-10-CM | POA: Insufficient documentation

## 2020-06-24 DIAGNOSIS — Z8673 Personal history of transient ischemic attack (TIA), and cerebral infarction without residual deficits: Secondary | ICD-10-CM | POA: Insufficient documentation

## 2020-06-24 DIAGNOSIS — Z7984 Long term (current) use of oral hypoglycemic drugs: Secondary | ICD-10-CM | POA: Diagnosis not present

## 2020-06-24 LAB — CBC
HCT: 42.8 % (ref 36.0–46.0)
Hemoglobin: 13.9 g/dL (ref 12.0–15.0)
MCH: 31.4 pg (ref 26.0–34.0)
MCHC: 32.5 g/dL (ref 30.0–36.0)
MCV: 96.8 fL (ref 80.0–100.0)
Platelets: 184 10*3/uL (ref 150–400)
RBC: 4.42 MIL/uL (ref 3.87–5.11)
RDW: 11.7 % (ref 11.5–15.5)
WBC: 7.4 10*3/uL (ref 4.0–10.5)
nRBC: 0 % (ref 0.0–0.2)

## 2020-06-24 LAB — BASIC METABOLIC PANEL
Anion gap: 13 (ref 5–15)
BUN: 16 mg/dL (ref 8–23)
CO2: 21 mmol/L — ABNORMAL LOW (ref 22–32)
Calcium: 9.1 mg/dL (ref 8.9–10.3)
Chloride: 105 mmol/L (ref 98–111)
Creatinine, Ser: 0.91 mg/dL (ref 0.44–1.00)
GFR, Estimated: >60 mL/min (ref >60)
Glucose, Bld: 183 mg/dL — ABNORMAL HIGH (ref 70–99)
Potassium: 3.8 mmol/L (ref 3.5–5.1)
Sodium: 139 mmol/L (ref 135–145)

## 2020-06-24 LAB — CBG MONITORING, ED
Glucose-Capillary: 135 mg/dL — ABNORMAL HIGH (ref 70–99)
Glucose-Capillary: 74 mg/dL (ref 70–99)
Glucose-Capillary: 73 mg/dL (ref 70–99)

## 2020-06-24 LAB — SARS CORONAVIRUS 2 (TAT 6-24 HRS): SARS Coronavirus 2: POSITIVE — AB

## 2020-06-24 MED ORDER — SODIUM CHLORIDE 0.9 % IV BOLUS
1000.0000 mL | Freq: Once | INTRAVENOUS | Status: AC
  Administered 2020-06-24: 1000 mL via INTRAVENOUS

## 2020-06-24 NOTE — ED Triage Notes (Signed)
Pt via EMS with generalized weakness and chronic back pain. Seen for same yesterday and d/c however family would like her to be admitted and wants her to have IV fluids. Hx dementia, at her baseline now.

## 2020-06-24 NOTE — ED Notes (Signed)
Pt given food to get sugar up.

## 2020-06-24 NOTE — Discharge Instructions (Addendum)
As discussed, your evaluation today has been largely reassuring.  But, it is important that you monitor your condition carefully, and do not hesitate to return to the ED if you develop new, or concerning changes in your condition.  Otherwise, please follow-up with your physician for appropriate ongoing care.  Please be sure to discuss your recent emergency department evaluations.  Specifically, discussed your blood pressure medication regimen as well as your other medications.

## 2020-06-24 NOTE — ED Provider Notes (Signed)
[facility] EMERGENCY DEPARTMENT Provider Note   CSN: [id number] Arrival date & time: 06/24/20  2423     History Chief Complaint  Patient presents with  . Weakness    Theresa Mendez is a 75 y.o. female.  HPI Patient presents with her sister who provides the history. Patient has a history of dementia, level 5 caveat.  Patient answers questions about her current situation in terms of pain, weakness, nausea, seemingly appropriately, but cannot provide any long-term details. Patient notes that she has ongoing back pain, sore, worse with motion.  Sister notes that this is a chronic issue, largely unchanged.  Patient takes oral medication for relief, and is scheduled for an attempt at analgesia with dry needling in 2 days. No report of new incontinence, fever, abdominal pain, fall, cough, nausea, vomiting, diarrhea. Patient has received 2 vaccine doses, 1 booster for COVID.  She was here yesterday, seen, evaluated, discharged.  With concern for weakness beyond normal earlier today the sister brings the patient here for evaluation.    Past Medical History:  Diagnosis Date  . Alzheimer disease (HCC)   . Dementia (HCC)   . Diabetes mellitus without complication (HCC)   . Hypertension     Patient Active Problem List   Diagnosis Date Noted  . CAP (community acquired pneumonia) 05/06/2020  . Hyperglycemia due to diabetes mellitus (HCC) 05/06/2020  . Leukocytosis 05/06/2020  . Viral pneumonia 05/06/2020  . Uncontrolled type 2 diabetes mellitus with complication, without long-term current use of insulin (HCC) 11/26/2015  . Hyperlipidemia associated with type 2 diabetes mellitus (HCC) 04/24/2014  . Hypertension associated with diabetes (HCC) 04/24/2014  . Anxiety and depression 03/21/2013  . Alzheimer's disease (HCC) 09/18/2012  . S/P colonoscopy with polypectomy 10/06/2011  . Spinal stenosis of lumbar region 07/29/2010  . LVH (left ventricular hypertrophy) 08/05/2009   . Vitamin D deficiency 08/05/2009  . DJD (degenerative joint disease), cervical 05/20/2009  . Stroke (HCC) 05/20/2009  . Breast calcification seen on mammogram 01/26/2009    Past Surgical History:  Procedure Laterality Date  . ABDOMINAL HYSTERECTOMY    . COLON SURGERY       OB History   No obstetric history on file.     Family History  Problem Relation Age of Onset  . Dementia Mother   . Dementia Sister   . Dementia Maternal Grandmother     Social History   Tobacco Use  . Smoking status: Former Smoker    Packs/day: 0.50    Years: 8.00    Pack years: 4.00  . Smokeless tobacco: Never Used  Vaping Use  . Vaping Use: Never used  Substance Use Topics  . Alcohol use: No  . Drug use: No    Home Medications Prior to Admission medications   Medication Sig Start Date End Date Taking? Authorizing Provider  acetaminophen (TYLENOL) 325 MG tablet Take 650 mg by mouth every 6 (six) hours as needed for moderate pain.     [provider]  amLODipine (NORVASC) 2.5 MG tablet Take 2.5 mg by mouth daily.     [provider]  buPROPion (WELLBUTRIN) 75 MG tablet Take 75 mg by mouth daily. 10/22/19   [provider]  chlorhexidine (PERIDEX) 0.12 % solution SMARTSIG:By Mouth 03/20/20   [provider]  DERMA-SMOOTHE/FS SCALP 0.01 % OIL SMARTSIG:1 Sparingly Topical Once a Week PRN 03/17/20   [provider]  docusate sodium (COLACE) 100 MG capsule Take 100 mg by mouth daily.  [provider]  donepezil (ARICEPT) 10 MG tablet Take 1 tablet (10 mg total) by mouth at bedtime. 02/27/20   Van Clines, MD  DULoxetine (CYMBALTA) 30 MG capsule Take by mouth. 04/13/20   [provider]  hydrALAZINE (APRESOLINE) 100 MG tablet Take 100 mg by mouth 3 (three) times daily. 10/22/19   [provider]  hydrOXYzine (ATARAX/VISTARIL) 10 MG tablet Take 10 mg by mouth at bedtime. 03/17/20   [provider]  lisinopril  (PRINIVIL,ZESTRIL) 40 MG tablet Take 40 mg by mouth daily.    [provider]  memantine (NAMENDA XR) 14 MG CP24 24 hr capsule Take 1 capsule (14 mg total) by mouth daily. 02/27/20   Van Clines, MD  metFORMIN (GLUCOPHAGE) 500 MG tablet Take 500 mg by mouth 2 (two) times daily. 03/17/20   [provider]  metoprolol tartrate (LOPRESSOR) 100 MG tablet Take 100 mg by mouth daily. 10/22/19   [provider]  metoprolol tartrate (LOPRESSOR) 100 MG tablet Take by mouth. 04/29/20   [provider]  naproxen sodium (ANAPROX) 550 MG tablet Take 550 mg by mouth 2 (two) times daily. 03/20/20   [provider]  potassium chloride (KLOR-CON) 8 MEQ tablet Take by mouth.    [provider]  sitaGLIPtin (JANUVIA) 100 MG tablet Take by mouth. 06/07/18   [provider]  spironolactone (ALDACTONE) 25 MG tablet Take 0.5 tablets by mouth daily. 07/12/18   [provider]  triamcinolone (KENALOG) 0.1 % SMARTSIG:1 Application Topical 2-3 Times Daily 03/17/20   [provider]    Allergies    Patient has no known allergies.  Review of Systems   Review of Systems  Unable to perform ROS: Dementia    Physical Exam Updated Vital Signs BP 115/69 (BP Location: Left Arm)   Pulse 66   Temp 98.5 F (36.9 C) (Oral)   Resp 14   SpO2 100%   Physical Exam Vitals and nursing note reviewed.  Constitutional:      General: She is not in acute distress.    Appearance: She is well-developed and well-nourished.  HENT:     Head: Normocephalic and atraumatic.  Eyes:     Extraocular Movements: EOM normal.     Conjunctiva/sclera: Conjunctivae normal.  Cardiovascular:     Rate and Rhythm: Normal rate and regular rhythm.  Pulmonary:     Effort: Pulmonary effort is normal. No respiratory distress.     Breath sounds: Normal breath sounds. No stridor.  Abdominal:     General: There is no distension.     Tenderness: There is no abdominal  tenderness. There is no guarding.  Musculoskeletal:        General: No edema.  Skin:    General: Skin is warm and dry.     Findings: No bruising.  Neurological:     Mental Status: She is alert.     Cranial Nerves: No cranial nerve deficit.     Motor: Atrophy present. No tremor or abnormal muscle tone.     Comments: No asymmetry of strength, no inability to follow commands that are direct, no appreciable weakness in either lower extremity.  Face is symmetric, speech is clear, brief.  Psychiatric:        Mood and Affect: Mood and affect normal.        Cognition and Memory: Cognition is impaired. Memory is impaired.     Comments: Slowed, withdrawn, but when spoken to directly, pleasantly interactive, smiling.  ED Results / Procedures / Treatments   Labs (all labs ordered are listed, but only abnormal results are displayed) Labs Reviewed  BASIC METABOLIC PANEL - Abnormal; Notable for the following components:      Result Value   CO2 21 (*)    Glucose, Bld 183 (*)    All other components within normal limits  CBG MONITORING, ED - Abnormal; Notable for the following components:   Glucose-Capillary 135 (*)    All other components within normal limits  CBC  URINALYSIS, ROUTINE W REFLEX MICROSCOPIC    EKG EKG Interpretation  Date/Time:  Wednesday June 24 2020 10:10:15 EST Ventricular Rate:  63 PR Interval:  140 QRS Duration: 74 QT Interval:  412 QTC Calculation: 421 R Axis:   61 Text Interpretation: Normal sinus rhythm Possible Left atrial enlargement No significant change since last tracing T wave abnormality Abnormal ECG Confirmed by Gerhard Munch [phone]) on 06/24/2020 2:58:49 PM   Radiology No results found.  Procedures Procedures (including critical care time)  Medications Ordered in ED Medications  sodium chloride 0.9 % bolus 1,000 mL (has no administration in time range)    ED Course  I have reviewed the triage vital signs and the nursing  notes.  Pertinent labs & imaging results that were available during my care of the patient were reviewed by me and considered in my medical decision making (see chart for details).   Reviewed from yesterday's visit notable for reassuring results, no evidence of distress, reassuring physical exam. With consideration of reported weakness patient was placed on monitoring, had sinus rhythm, rates 60s, unremarkable, pulse oximetry 100% room air normal Blood pressure during my initial evaluation 130/77. Consideration of weakness secondary to progression of disease versus medications versus occult illness versus dehydration patient had labs, was started on fluid resuscitation.  10:36 PM Patient in no distress, smiling, sitting on the edge of her bed. She has had some delay in obtaining IV access, but has received initial fluid resuscitation, has no hemodynamic instability. She has no complaints personally. With her sister we again discussed all findings, reassuring, no evidence for decompensated dehydration, no substantial electrolyte abnormalities.  Patient has no pain with urinating, no fever, no leukocytosis, low suspicion for occult UTI, and given her reassuring findings, absence of changes over hours of monitoring both today and yesterday, patient is appropriate for continued follow-up as an outpatient, to which the sister is amenable.  Final Clinical Impression(s) / ED Diagnoses Final diagnoses:  Weakness     Gerhard Munch, MD 06/24/20 2237

## 2020-08-14 ENCOUNTER — Telehealth: Payer: Self-pay | Admitting: Podiatry

## 2020-08-14 NOTE — Telephone Encounter (Signed)
Called to swap patient's appointment with another patient. R/s form 3/8 to 4/1

## 2020-08-14 NOTE — Telephone Encounter (Signed)
Called to swap patients appin

## 2020-08-18 ENCOUNTER — Ambulatory Visit: Payer: Medicare Other | Admitting: Podiatry

## 2020-09-03 DIAGNOSIS — R059 Cough, unspecified: Secondary | ICD-10-CM | POA: Insufficient documentation

## 2020-09-11 ENCOUNTER — Ambulatory Visit (INDEPENDENT_AMBULATORY_CARE_PROVIDER_SITE_OTHER): Payer: Medicare Other | Admitting: Podiatry

## 2020-09-11 ENCOUNTER — Other Ambulatory Visit: Payer: Self-pay

## 2020-09-11 DIAGNOSIS — M79674 Pain in right toe(s): Secondary | ICD-10-CM | POA: Diagnosis not present

## 2020-09-11 DIAGNOSIS — M2012 Hallux valgus (acquired), left foot: Secondary | ICD-10-CM

## 2020-09-11 DIAGNOSIS — E119 Type 2 diabetes mellitus without complications: Secondary | ICD-10-CM

## 2020-09-11 DIAGNOSIS — M2011 Hallux valgus (acquired), right foot: Secondary | ICD-10-CM

## 2020-09-11 DIAGNOSIS — M2042 Other hammer toe(s) (acquired), left foot: Secondary | ICD-10-CM

## 2020-09-11 DIAGNOSIS — M2041 Other hammer toe(s) (acquired), right foot: Secondary | ICD-10-CM

## 2020-09-11 DIAGNOSIS — B351 Tinea unguium: Secondary | ICD-10-CM

## 2020-09-11 DIAGNOSIS — M79675 Pain in left toe(s): Secondary | ICD-10-CM | POA: Diagnosis not present

## 2020-09-19 ENCOUNTER — Encounter: Payer: Self-pay | Admitting: Podiatry

## 2020-09-19 NOTE — Progress Notes (Signed)
Subjective: Theresa Mendez presents today for preventative diabetic foot care and painful thick toenails that are difficult to trim. Pain interferes with ambulation. Aggravating factors include wearing enclosed shoe gear. Pain is relieved with periodic professional debridement.  Patient does not check blood glucose. Caregiver does it and patient is unsure what it was today.  [name], NP is patient's PCP. Last visit was 1-2 months ago.  No Known Allergies   Review of Systems: Negative except as noted in the HPI.   Objective: There were no vitals filed for this visit.  Theresa Mendez is a pleasant 75 y.o. female in NAD. AAO X 3.  Vascular Examination: Capillary refill time to digits immediate b/l. Palpable DP pulse(s) b/l lower extremities Faintly palpable PT pulse(s) b/l lower extremities. Pedal hair present. Lower extremity skin temperature gradient within normal limits.  Dermatological Examination: Pedal skin with normal turgor, texture and tone bilaterally. No open wounds bilaterally. No interdigital macerations bilaterally. Toenails 1-5 b/l elongated, discolored, dystrophic, thickened, crumbly with subungual debris and tenderness to dorsal palpation.  Musculoskeletal Examination: Normal muscle strength 5/5 to all lower extremity muscle groups bilaterally. No pain crepitus or joint limitation noted with ROM b/l. 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 Hammertoes noted to the 2-5 bilaterally.  Neurological Examination: Protective sensation intact 5/5 intact bilaterally with 10g monofilament b/l. Vibratory sensation intact b/l. Clonus negative b/l.  Hemoglobin A1C Latest Ref Rng & Units 05/06/2020  HGBA1C 4.8 - 5.6 % 6.2(H)  Some recent data might be hidden   Assessment: 1. Pain due to onychomycosis of toenails of both feet   2. Hallux valgus, acquired, bilateral   3. Acquired hammertoes of both feet   4. Controlled type 2 diabetes mellitus without  complication, without long-term current use of insulin (HCC)     Plan: -Examined patient -Continue diabetic foot care principles. -Patient to continue soft, supportive shoe gear daily. Shoe recommendations given on today's visit. -Toenails 1-5 b/l were debrided in length and girth with sterile nail nippers and dremel without iatrogenic bleeding.  -Patient to report any pedal injuries to medical professional immediately. -Patient/POA to call should there be question/concern in the interim.  Return in about 3 months (around 12/11/2020).  Freddie Breech, DPM

## 2020-10-02 ENCOUNTER — Telehealth: Payer: Self-pay | Admitting: Neurology

## 2020-10-02 NOTE — Telephone Encounter (Signed)
Patient's sister [name] called needing some guidance. She said the patient is still not aware her husband has passed away from Covid-19 because she was in a facility at the time. Patient is asking for him more and more and talking about leaving to go and find him.   Patient also seems to be having some sundowners issues.

## 2020-10-05 NOTE — Telephone Encounter (Signed)
Spoke with pt sister informed her that Unfortunately, Alzheimer's dementia is progressive. She is expected to have worsening memory. She may remind her sister of her husband's passing at that time and she should reassure and comfort her sister if she becomes more upset. Pt sister is asking if it will be helpful for her to see psychiatry? Pt sister was also mailed out a green folder

## 2020-10-05 NOTE — Telephone Encounter (Signed)
Pt called no answer left a voice mail to call the office back  °

## 2020-10-06 NOTE — Telephone Encounter (Signed)
A psychiatrist may be able to help with anxiety related to the dementia by prescribing medications, but otherwise not sure how much more, she would not remember any psychotherapy done with her.

## 2020-10-07 NOTE — Telephone Encounter (Signed)
Spoke with pt sister [name] informed her that Per Dr Karel Jarvis A psychiatrist may be able to help with anxiety related to the dementia by prescribing medications, but otherwise not sure how much more, she would not remember any psychotherapy done with her.

## 2020-10-09 ENCOUNTER — Ambulatory Visit: Payer: Medicare Other | Admitting: Neurology

## 2020-10-12 ENCOUNTER — Ambulatory Visit (INDEPENDENT_AMBULATORY_CARE_PROVIDER_SITE_OTHER): Payer: Medicare Other | Admitting: Neurology

## 2020-10-12 ENCOUNTER — Other Ambulatory Visit: Payer: Self-pay

## 2020-10-12 ENCOUNTER — Encounter: Payer: Self-pay | Admitting: Neurology

## 2020-10-12 VITALS — BP 119/76 | HR 70 | Ht 63.5 in | Wt 174.4 lb

## 2020-10-12 DIAGNOSIS — F0280 Dementia in other diseases classified elsewhere without behavioral disturbance: Secondary | ICD-10-CM | POA: Diagnosis not present

## 2020-10-12 DIAGNOSIS — G301 Alzheimer's disease with late onset: Secondary | ICD-10-CM

## 2020-10-12 NOTE — Patient Instructions (Signed)
1. Check on medications, how is she taking Memantine and Donepezil  2. Continue with nonpharmacologic/behavioral techniques (redirection/distraction) and see if this helps with anxiety/agitation  3. We can increase Bupropion if needed  4. Follow-up in 6 months, call for any changes    FALL PRECAUTIONS: Be cautious when walking. Scan the area for obstacles that may increase the risk of trips and falls. When getting up in the mornings, sit up at the edge of the bed for a few minutes before getting out of bed. Consider elevating the bed at the head end to avoid drop of blood pressure when getting up. Walk always in a well-lit room (use night lights in the walls). Avoid area rugs or power cords from appliances in the middle of the walkways. Use a walker or a cane if necessary and consider physical therapy for balance exercise. Get your eyesight checked regularly.   HOME SAFETY: Consider the safety of the kitchen when operating appliances like stoves, microwave oven, and blender. Consider having supervision and share cooking responsibilities until no longer able to participate in those. Accidents with firearms and other hazards in the house should be identified and addressed as well.   ABILITY TO BE LEFT ALONE: If patient is unable to contact 911 operator, consider using LifeLine, or when the need is there, arrange for someone to stay with patients. Smoking is a fire hazard, consider supervision or cessation. Risk of wandering should be assessed by caregiver and if detected at any point, supervision and safe proof recommendations should be instituted.  RECOMMENDATIONS FOR ALL PATIENTS WITH MEMORY PROBLEMS: 1. Continue to exercise (Recommend 30 minutes of walking everyday, or 3 hours every week) 2. Increase social interactions - continue going to [place] and enjoy social gatherings with friends and family 3. Eat healthy, avoid fried foods and eat more fruits and vegetables 4. Maintain adequate blood  pressure, blood sugar, and blood cholesterol level. Reducing the risk of stroke and cardiovascular disease also helps promoting better memory. 5. Avoid stressful situations. Live a simple life and avoid aggravations. Organize your time and prepare for the next day in anticipation. 6. Sleep well, avoid any interruptions of sleep and avoid any distractions in the bedroom that may interfere with adequate sleep quality 7. Avoid sugar, avoid sweets as there is a strong link between excessive sugar intake, diabetes, and cognitive impairment The Mediterranean diet has been shown to help patients reduce the risk of progressive memory disorders and reduces cardiovascular risk. This includes eating fish, eat fruits and green leafy vegetables, nuts like almonds and hazelnuts, walnuts, and also use olive oil. Avoid fast foods and fried foods as much as possible. Avoid sweets and sugar as sugar use has been linked to worsening of memory function.  There is always a concern of gradual progression of memory problems. If this is the case, then we may need to adjust level of care according to patient needs. Support, both to the patient and caregiver, should then be put into place.

## 2020-10-12 NOTE — Progress Notes (Signed)
NEUROLOGY FOLLOW UP OFFICE NOTE  Theresa Mendez [id number] [birth year]  HISTORY OF PRESENT ILLNESS: I had the pleasure of seeing Theresa Mendez in follow-up in the neurology clinic on 75/07/2020.  The patient was last seen 8 months ago for dementia. She is again accompanied by her sister [place] Mendez who helps supplement the history today.  Since her last visit, [place] Mendez reports that she has moved back home with her other sister with dementia, they have a live-in caregiver who assists with medications, meals, dressing and bathing. She feels she is doing fine, she denies any headaches, dizziness, no falls. Appetite is good. She reports sleeping well. She mostly laughs when asked memory questions. [place] Mendez reports there are times she talks about a man or someone taking her things, locking her bedroom door. There was one time she stepped outside the house talking about going home to [place]. Theresa Mendez notes a change in her walking 6 months ago, she is doing physical therapy now. Theresa Mendez is not sure about dizziness, there are times she reports feeling dizzy when walking, no falls. Theresa Mendez notes the aide has reported an increase in confusion, anxiety, and agitation. [place] Mendez is concerned about telling the patient her husband is deceased when she asks about him. She is on Bupropion 75mg  BID. Her medication list indicates Donepezil 10mg  daily and Memantine 5mg  BID, however will call our office to confirm doses.    History on Initial Assessment 02/27/2020: This is a 75 year old left-handed woman with a history of hypertension, hyperlipidemia, diabetes, dementia, presenting to establish care. She was previously living in [place] and moved to [facility] to be closer to her sister and POA. She states her memory is not good. Records were reviewed. Records indicate that memory changes started in 2014, she tended to be repetitive.  Prior Neuropsychological testing in 2014 and 2016 indicated an amnestic profile suggestive of Alzheimer's disease. MRI  brain in 2014 showed significant frontal, parietal, and temporal atrophy. No significant vascular changes. MOCA score 75/30 in 08/2017 on her last visit with her neurologist. She is on Donepezil 10mg  daily and Memantine ER 14mg  daily without side effects. She states her husband is still living in their house (he is deceased). 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 Medications are administered at [facility]. She asks "where is 09/2017?" Her older sister with dementia also resides there. Her POA manages finances. She does not drive. She states she is independent with dressing and bathing, reminds her that she is assisted with this. She goes to the dining hall for meals. She states sleep is good. She denies any headaches, dizziness, diplopia, dysarthria/dysphagia, focal numbness/tingling/weakness, bowel/bladder dysfunction, anosmia, or tremors. She has chronic back pain and is doing physical therapy, although she does not recall this and her sister reminds her she has it 75 several times a week. No personality changes, paranoia. She was previously on lorazepam but has not needed it. Sometimes she has mentioned seeing snakes, but not regularly. There is a strong family history of dementia in her mother, maternal grandmother, older sister. She does not drink alcohol.    PAST MEDICAL HISTORY: Past Medical History:  Diagnosis Date  . Alzheimer disease (HCC)   . Dementia (HCC)   . Diabetes mellitus without complication (HCC)   . Hypertension     MEDICATIONS: Current Outpatient Medications on File Prior to Visit  Medication Sig Dispense Refill  .  acetaminophen (TYLENOL) 325 MG tablet Take 650 mg by mouth every 6 (six) hours as needed for moderate pain.     Marland Kitchen amLODipine (NORVASC) 2.5 MG tablet Take 2.5 mg by mouth daily.     Marland Kitchen buPROPion (WELLBUTRIN) 75 MG tablet bupropion HCl 75 mg  tablet  Take 1 tablet twice a day by oral route.    . chlorhexidine (PERIDEX) 0.12 % solution SMARTSIG:By Mouth    . DERMA-SMOOTHE/FS SCALP 0.01 % OIL SMARTSIG:1 Sparingly Topical Once a Week PRN    . Diclofenac Sodium 3 % GEL diclofenac 3 % topical gel  APPLY TO LESION AREAS BY TOPICAL ROUTE 2 TIMES PER DAY    . docusate sodium (COLACE) 100 MG capsule Take 100 mg by mouth daily.    Marland Kitchen donepezil (ARICEPT) 10 MG tablet Aricept 10 mg tablet  Take 1 tablet every day by oral route.    . doxycycline (ADOXA) 100 MG tablet doxycycline monohydrate 100 mg tablet    . DULoxetine (CYMBALTA) 60 MG capsule Cymbalta 60 mg capsule,delayed release  Take 1 capsule every day by oral route.    Marland Kitchen guaiFENesin (MUCINEX) 600 MG 12 hr tablet guaifenesin ER 600 mg tablet, extended release 12 hr  Take 1 tablet every 12 hours by oral route for 7 days.    . hydrALAZINE (APRESOLINE) 100 MG tablet Take 100 mg by mouth 3 (three) times daily.    . hydrOXYzine (ATARAX/VISTARIL) 10 MG tablet Take 10 mg by mouth at bedtime.    Marland Kitchen lisinopril (PRINIVIL,ZESTRIL) 40 MG tablet Take 40 mg by mouth daily.    . memantine (NAMENDA) 5 MG tablet memantine 5 mg tablet  TAKE ONE TABLET BY MOUTH TWICE A DAY    . metFORMIN (GLUCOPHAGE) 500 MG tablet Take 500 mg by mouth 2 (two) times daily.    . metoprolol tartrate (LOPRESSOR) 100 MG tablet metoprolol tartrate 100 mg tablet  Take 1 tablet every day by oral route.    . potassium chloride (KLOR-CON) 8 MEQ tablet Take by mouth.    . sitaGLIPtin (JANUVIA) 100 MG tablet Januvia 100 mg tablet  Take 1 tablet every day by oral route.    Marland Kitchen spironolactone (ALDACTONE) 25 MG tablet Take 0.5 tablets by mouth daily.    . naproxen (NAPROSYN) 375 MG tablet naproxen 375 mg tablet  Take 1 tablet twice a day by oral route for 30 days. (Patient not taking: Reported on 10/12/2020)    . naproxen sodium (ANAPROX) 550 MG tablet Take 550 mg by mouth 2 (two) times daily. (Patient not taking: Reported on 10/12/2020)     . triamcinolone (KENALOG) 0.1 % SMARTSIG:1 Application Topical 2-3 Times Daily (Patient not taking: Reported on 10/12/2020)     No current facility-administered medications on file prior to visit.    ALLERGIES: Allergies  Allergen Reactions  . Plavix [Clopidogrel]     Family stated she had a reaction     FAMILY HISTORY: Family History  Problem Relation Age of Onset  . Dementia Mother   . Dementia Sister   . Dementia Maternal Grandmother     SOCIAL HISTORY: Social History   Socioeconomic History  . Marital status: Married    Spouse name: Not on file  . Number of children: Not on file  . Years of education: Not on file  . Highest education level: Not on file  Occupational History  . Not on file  Tobacco Use  . Smoking status: Former Smoker    Packs/day: 0.50  Years: 8.00    Pack years: 4.00  . Smokeless tobacco: Never Used  Vaping Use  . Vaping Use: Never used  Substance and Sexual Activity  . Alcohol use: No  . Drug use: No  . Sexual activity: Not on file  Other Topics Concern  . Not on file  Social History Narrative   Right handed    Lives at home    Has a husband    Social Determinants of Health   Financial Resource Strain: Not on file  Food Insecurity: Not on file  Transportation Needs: Not on file  Physical Activity: Not on file  Stress: Not on file  Social Connections: Not on file  Intimate Partner Violence: Not on file     PHYSICAL EXAM: Vitals:   10/12/20 1131  BP: 119/76  Pulse: 70  SpO2: 97%   General: No acute distress Head:  Normocephalic/atraumatic Skin/Extremities: No rash, no edema Neurological Exam: alert and oriented to person. States it is March 20-whatever. She is in a medical facility in [place], [place]. No aphasia or dysarthria. Fund of knowledge is reduced. Recent and remote memory are impaired, 0/3 delayed recall.  Attention and concentration are reduced, 1/5 WORLD backwards. Cranial nerves: Pupils equal, round. Extraocular  movements intact with no nystagmus. Visual fields full.  No facial asymmetry.  Motor: Bulk and tone normal, muscle strength 5/5 throughout with no pronator drift.   Finger to nose testing intact.  Gait slow and cautious with cane, no ataxia   IMPRESSION: This is a 75 yo LH woman with a history of hypertension, hyperlipidemia, diabetes, with Alzheimer's disease with behavioral disturbance. Neuropsychological testing in 2014 and 2016 indicated amnestic profile, consistent with AD. MRI brain in 2014 showed significant frontal, parietal, and temporal atrophy. On her last visit, she was noted to be on Donepezil 10mg  daily and Memantine ER 14mg  daily, however today medication list on file indicates Memantine 5mg  BID. Her sister will check medications at home and call to confirm. We discussed increased anxiety and management with nonpharmacologic/behavioral techniques, another option would be to increase Bupropion. Continue 24/7 supervision. Follow-up with our Memory Disorders PA in 6 months, call for any changes.   Thank you for allowing me to participate in her care.  Please do not hesitate to call for any questions or concerns.   , M.D.   CC: , NP

## 2020-12-07 ENCOUNTER — Encounter: Payer: Self-pay | Admitting: Podiatry

## 2020-12-23 ENCOUNTER — Ambulatory Visit: Payer: Medicare Other | Admitting: Podiatry

## 2021-01-21 ENCOUNTER — Telehealth: Payer: Self-pay | Admitting: Physician Assistant

## 2021-01-21 NOTE — Telephone Encounter (Signed)
I called patient, left message to call office at 318 01/21/2021 regarding her dizziness.

## 2021-01-26 ENCOUNTER — Ambulatory Visit (INDEPENDENT_AMBULATORY_CARE_PROVIDER_SITE_OTHER): Payer: Medicare Other | Admitting: Podiatry

## 2021-01-26 ENCOUNTER — Other Ambulatory Visit: Payer: Self-pay

## 2021-01-26 ENCOUNTER — Encounter: Payer: Self-pay | Admitting: Podiatry

## 2021-01-26 DIAGNOSIS — Z8673 Personal history of transient ischemic attack (TIA), and cerebral infarction without residual deficits: Secondary | ICD-10-CM | POA: Insufficient documentation

## 2021-01-26 DIAGNOSIS — B351 Tinea unguium: Secondary | ICD-10-CM

## 2021-01-26 DIAGNOSIS — M79674 Pain in right toe(s): Secondary | ICD-10-CM | POA: Diagnosis not present

## 2021-01-26 DIAGNOSIS — F419 Anxiety disorder, unspecified: Secondary | ICD-10-CM | POA: Insufficient documentation

## 2021-01-26 DIAGNOSIS — M791 Myalgia, unspecified site: Secondary | ICD-10-CM | POA: Insufficient documentation

## 2021-01-26 DIAGNOSIS — R002 Palpitations: Secondary | ICD-10-CM | POA: Insufficient documentation

## 2021-01-26 DIAGNOSIS — M79675 Pain in left toe(s): Secondary | ICD-10-CM

## 2021-01-30 NOTE — Progress Notes (Signed)
Subjective: Theresa Mendez is a pleasant 75 y.o. female patient seen today for preventative diabetic foot care painful thick toenails that are difficult to trim. Pain interferes with ambulation. Aggravating factors include wearing enclosed shoe gear. Pain is relieved with periodic professional debridement.  Patient states their blood glucose was 98 mg/dl on yesterday.  She is accompanied by her caregiver, [place], on today's visit. They voice no new pedal concerns on today's visit.  PCP is Annita Brod, MD. Last visit was: 04/23/2020.  Allergies  Allergen Reactions   Plavix [Clopidogrel]     Family stated she had a reaction     Objective: Physical Exam  General: Theresa Mendez is a pleasant 75 y.o. African American female, in NAD. AAO x 3.   Vascular:  Capillary refill time to digits immediate b/l. Palpable DP pulse(s) b/l lower extremities Faintly palpable PT pulse(s) b/l lower extremities. Pedal hair present. Lower extremity skin temperature gradient within normal limits. No pain with calf compression b/l.  Dermatological:  Pedal skin with normal turgor, texture and tone b/l lower extremities. No open wounds b/l lower extremities. No interdigital macerations b/l lower extremities. Toenails 1-5 b/l elongated, discolored, dystrophic, thickened, crumbly with subungual debris and tenderness to dorsal palpation.  Musculoskeletal:  Normal muscle strength 5/5 to all lower extremity muscle groups bilaterally. No pain crepitus or joint limitation noted with ROM b/l lower extremities. 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 Hammertoe(s) noted to the 2-5 bilaterally. Utilizes cane for ambulation assistance.  Neurological:  Protective sensation intact 5/5 intact bilaterally with 10g monofilament b/l. Vibratory sensation intact b/l.  Assessment and Plan:  1. Pain due to onychomycosis of toenails of both feet      -Examined patient. -Continue diabetic foot care  principles: inspect feet daily, monitor glucose as recommended by PCP and/or Endocrinologist, and follow prescribed diet per PCP, Endocrinologist and/or dietician. -Patient to continue soft, supportive shoe gear daily. -Toenails 1-5 b/l were debrided in length and girth with sterile nail nippers and dremel without iatrogenic bleeding.  -Patient to report any pedal injuries to medical professional immediately. -Patient/POA to call should there be question/concern in the interim.  Return in about 3 months (around 04/28/2021).  Freddie Breech, DPM

## 2021-04-15 ENCOUNTER — Other Ambulatory Visit: Payer: Self-pay

## 2021-04-15 ENCOUNTER — Encounter: Payer: Self-pay | Admitting: Physician Assistant

## 2021-04-15 ENCOUNTER — Ambulatory Visit (INDEPENDENT_AMBULATORY_CARE_PROVIDER_SITE_OTHER): Payer: Medicare Other | Admitting: Physician Assistant

## 2021-04-15 VITALS — BP 133/68 | HR 76 | Resp 18 | Ht 63.0 in | Wt 169.0 lb

## 2021-04-15 DIAGNOSIS — G301 Alzheimer's disease with late onset: Secondary | ICD-10-CM

## 2021-04-15 DIAGNOSIS — F0280 Dementia in other diseases classified elsewhere without behavioral disturbance: Secondary | ICD-10-CM

## 2021-04-15 NOTE — Patient Instructions (Addendum)
1. Check on medications, how is she taking Memantine and Donepezil  2. Continue with nonpharmacologic/behavioral techniques (redirection/distraction) and see if this helps with anxiety/agitation  3. We can increase Bupropion if needed  4. Handicap placard will be given to you   5.Recommend using a walker   6 Follow-up in 6 months, call for any changes    FALL PRECAUTIONS: Be cautious when walking. Scan the area for obstacles that may increase the risk of trips and falls. When getting up in the mornings, sit up at the edge of the bed for a few minutes before getting out of bed. Consider elevating the bed at the head end to avoid drop of blood pressure when getting up. Walk always in a well-lit room (use night lights in the walls). Avoid area rugs or power cords from appliances in the middle of the walkways. Use a walker or a cane if necessary and consider physical therapy for balance exercise. Get your eyesight checked regularly.   HOME SAFETY: Consider the safety of the kitchen when operating appliances like stoves, microwave oven, and blender. Consider having supervision and share cooking responsibilities until no longer able to participate in those. Accidents with firearms and other hazards in the house should be identified and addressed as well.   ABILITY TO BE LEFT ALONE: If patient is unable to contact 911 operator, consider using LifeLine, or when the need is there, arrange for someone to stay with patients. Smoking is a fire hazard, consider supervision or cessation. Risk of wandering should be assessed by caregiver and if detected at any point, supervision and safe proof recommendations should be instituted.  RECOMMENDATIONS FOR ALL PATIENTS WITH MEMORY PROBLEMS: 1. Continue to exercise (Recommend 30 minutes of walking everyday, or 3 hours every week) 2. Increase social interactions - continue going to [place] and enjoy social gatherings with friends and family 3. Eat healthy, avoid  fried foods and eat more fruits and vegetables 4. Maintain adequate blood pressure, blood sugar, and blood cholesterol level. Reducing the risk of stroke and cardiovascular disease also helps promoting better memory. 5. Avoid stressful situations. Live a simple life and avoid aggravations. Organize your time and prepare for the next day in anticipation. 6. Sleep well, avoid any interruptions of sleep and avoid any distractions in the bedroom that may interfere with adequate sleep quality 7. Avoid sugar, avoid sweets as there is a strong link between excessive sugar intake, diabetes, and cognitive impairment The Mediterranean diet has been shown to help patients reduce the risk of progressive memory disorders and reduces cardiovascular risk. This includes eating fish, eat fruits and green leafy vegetables, nuts like almonds and hazelnuts, walnuts, and also use olive oil. Avoid fast foods and fried foods as much as possible. Avoid sweets and sugar as sugar use has been linked to worsening of memory function.  There is always a concern of gradual progression of memory problems. If this is the case, then we may need to adjust level of care according to patient needs. Support, both to the patient and caregiver, should then be put into place.

## 2021-04-15 NOTE — Progress Notes (Signed)
Assessment/Plan:    Dementia due to Alzheimer's disease with behavioral disturbance   Recommendations:  Discussed safety both in and out of the home.  Discussed the importance of regular daily schedule to maintain brain function.  Continue to monitor mood by PCP Stay active at least 30 minutes at least 3 times a week.  Naps should be scheduled and should be no longer than 60 minutes and should not occur after 2 PM.  Mediterranean diet is recommended  Continue donepezil 10 mg daily Side effects were discussed Continue Memantine 10 mg twice daily.Side effects were discussed  Continue bupropion, for the management of anxiety and consider nonpharmacologic behavioral techniques.  If needed, this can be increased. Continue 24/7 supervision Follow up in 6  months.   Case discussed with Dr. Karel Jarvis who agrees with the plan    Subjective:   ED visits since last seen: none  Hospital admissions: none  Theresa Mendez is a 75 y.o. female RH with a history of hypertension, hyperlipidemia, diabetes, Alzheimer's disease with behavioral disturbance, seen today in follow-up for memory loss.  She was last seen at our office on 10/12/2020.  She is accompanied in the office by her caregiver, who supplements the history.  Previous records as well as any outside records available were reviewed prior to today's visit.  She is currently on memantine 10 mg twice daily, and donepezil 10 mg daily.  The caregiver reports that the memory is "not better ".  She continues to be forgetful, and at times anxious and combative, although bupropion helped.  She continues to repeat the same stories and asked the same questions.  At times she does not know what she came to the room for.  She has to be redirected occasionally, to where the bathroom is for example.  She has short attention, gets tired in the bowel movements tasks.  However, she enjoys washing dishes.  Her appetite is good, but sometimes she forgets to eat.  She  does leave objects "everywhere, and she hides constantly some stuff, such as her hairbrush ".  She lives with the sitter and her roommate, who are very supportive.  Sometimes, the son comes to visit her and sleeps in the living room.  He reports that she gets up in the middle of the night at times, walks around.  He is not sure if she sleepwalking, or that she had to go to the bathroom.  She only sleeps with a light on.  She denies any visual hallucinations, but she may have auditory hallucinations such as hearing the ringing when no one is doing so.  She denies paranoia.  She needs direction of bathing, otherwise she would not take a shower on her own initiative.  Her caregiver lysed clothing for her to wear, otherwise she will wear the same clothes.  This year prepares a pillbox for her to take the medications.  Son takes care of the finances.  She does not drive, her sitter drives her to where she needs to go.  Her appetite is good, "eat a lot ".  However, she does not drink enough water.  She denies trouble swallowing.  She no longer cooks.  When she ambulates, she may take "too fast steps ".  She uses a cane in the right hand, but they are in the process of getting a walker for stability to prevent any falls.  No head injuries are reported.  Denies headaches, double vision, occasionally she has dizziness, especially when not drinking.  If she drinks, she will not be dizzy.  There is no focal numbness, or tingling reported. Denies unilateral weakness or tremors, or anosmia.  No history of seizures.  She has chronic urine incontinence using diapers, denies constipation or diarrhea.   History on Initial Assessment 02/27/2020: This is a 75 year old left-handed woman with a history of hypertension, hyperlipidemia, diabetes, dementia, presenting to establish care. She was previously living in [place] and moved to [facility] to be closer to her sister and POA. She states her memory is not good. Records were  reviewed. Records indicate that memory changes started in 2014, she tended to be repetitive.  Prior Neuropsychological testing in 2014 and 2016 indicated an amnestic profile suggestive of Alzheimer's disease. MRI brain in 2014 showed significant frontal, parietal, and temporal atrophy. No significant vascular changes. MOCA score 15/30 in 08/2017 on her last visit with her neurologist. She is on Donepezil 10mg  daily and Memantine ER 14mg  daily without side effects. She states her husband is still living in their house (he is deceased). 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 Medications are administered at [facility]. She asks "where is 11-26-1976?" Her older sister with dementia also resides there. Her POA manages finances. She does not drive. She states she is independent with dressing and bathing, [facility] reminds her that she is assisted with this. She goes to the dining hall for meals. She states sleep is good. She denies any headaches, dizziness, diplopia, dysarthria/dysphagia, focal numbness/tingling/weakness, bowel/bladder dysfunction, anosmia, or tremors. She has chronic back pain and is doing physical therapy, although she does not recall this and her sister reminds her she has it several times a week. No personality changes, paranoia. She was previously on lorazepam but has not needed it. Sometimes she has mentioned seeing snakes, but not regularly. There is a strong family history of dementia in her mother, maternal grandmother, older sister. She does not drink alcohol.     PREVIOUS MEDICATIONS:   CURRENT MEDICATIONS:  Outpatient Encounter Medications as of 04/15/2021  Medication Sig   acetaminophen (TYLENOL) 325 MG tablet Take 650 mg by mouth every 6 (six) hours as needed for moderate pain.    amLODipine (NORVASC) 2.5 MG tablet Take 2.5 mg by mouth daily.    amLODipine (NORVASC) 5  MG tablet amlodipine 5 mg tablet  TAKE 1 TABLET BY MOUTH EVERY DAY   atovaquone (MEPRON) 750 MG/5ML suspension atovaquone 750 mg/5 mL oral suspension  TAKE 5 ML ORAL SUSPENSION 2 TIMES A DAY TREAT TOXOPLASMOSIS   azithromycin (ZITHROMAX) 250 MG tablet azithromycin 250 mg tablet  TAKE 2 TABLETS ON 1ST DAY. TAKE 1 TABLET TIMES 5 DAYS TREAT M AND C PNEUMONIAE BACTERIA   buPROPion (WELLBUTRIN) 75 MG tablet bupropion HCl 75 mg tablet  Take 1 tablet twice a day by oral route.   chlorhexidine (PERIDEX) 0.12 % solution SMARTSIG:By Mouth   DERMA-SMOOTHE/FS SCALP 0.01 % OIL SMARTSIG:1 Sparingly Topical Once a Week PRN   Diclofenac Sodium 3 % GEL diclofenac 3 % topical gel  APPLY TO LESION AREAS BY TOPICAL ROUTE 2 TIMES PER DAY   docusate sodium (COLACE) 100 MG capsule Take 100 mg by mouth daily.   donepezil (ARICEPT) 10 MG tablet Aricept 10 mg tablet  Take 1 tablet every day by oral route.   doxycycline (ADOXA) 100 MG tablet doxycycline monohydrate 100 mg tablet   DULoxetine (CYMBALTA)  30 MG capsule duloxetine 30 mg capsule,delayed release  TAKE ONE CAPSULE BY MOUTH EVERY DAY   DULoxetine (CYMBALTA) 60 MG capsule Cymbalta 60 mg capsule,delayed release  Take 1 capsule every day by oral route.   guaiFENesin (MUCINEX) 600 MG 12 hr tablet guaifenesin ER 600 mg tablet, extended release 12 hr  Take 1 tablet every 12 hours by oral route for 7 days.   hydrALAZINE (APRESOLINE) 100 MG tablet Take 100 mg by mouth 3 (three) times daily.   hydrOXYzine (ATARAX/VISTARIL) 10 MG tablet Take 10 mg by mouth at bedtime.   liothyronine (CYTOMEL) 5 MCG tablet liothyronine 5 mcg tablet  TAKE 1 TO 2 TABLETS IN THE AM BEFORE FOOD DRINK OR SUPPLEMENTS   lisinopril (PRINIVIL,ZESTRIL) 40 MG tablet Take 40 mg by mouth daily.   memantine (NAMENDA) 10 MG tablet memantine 10 mg tablet  TAKE 1 TABLET BY MOUTH TWICE A DAY   metFORMIN (GLUCOPHAGE) 500 MG tablet Take 500 mg by mouth 2 (two) times daily.   metoprolol tartrate  (LOPRESSOR) 100 MG tablet metoprolol tartrate 100 mg tablet  Take 1 tablet every day by oral route.   naproxen (NAPROSYN) 375 MG tablet    naproxen sodium (ANAPROX) 550 MG tablet Take 550 mg by mouth 2 (two) times daily.   potassium chloride (KLOR-CON) 8 MEQ tablet Take by mouth.   sitaGLIPtin (JANUVIA) 100 MG tablet Januvia 100 mg tablet  Take 1 tablet every day by oral route.   spironolactone (ALDACTONE) 25 MG tablet Take 0.5 tablets by mouth daily.   triamcinolone (KENALOG) 0.1 %    memantine (NAMENDA) 5 MG tablet memantine 5 mg tablet  TAKE ONE TABLET BY MOUTH TWICE A DAY   No facility-administered encounter medications on file as of 04/15/2021.     Objective:     PHYSICAL EXAMINATION:    VITALS:   Vitals:   04/15/21 1100  BP: 133/68  Pulse: 76  Resp: 18  SpO2: 98%  Weight: 169 lb (76.7 kg)  Height: 5\' 3"  (1.6 m)    GEN:  The patient appears stated age and is in NAD. HEENT:  Normocephalic, atraumatic.   Neurological examination:  General: NAD, well-groomed, appears stated age. Orientation: The patient is alert. Oriented to person, not to place and date.  She is in , the year is 2007, April.  Able to tell the date.  She is unable to spell the world backwards, delayed recall 0 of 3.  Cranial nerves: There is good facial symmetry.The speech is fluent and clear. No aphasia or dysarthria. Fund of knowledge is reduced. Recent and remote memory are impaired. Attention and concentration are reduced.  Able to name objects and repeat phrases.  Hearing is intact to conversational tone.    Sensation: Sensation is intact to light touch throughout Motor: Strength is at least antigravity x4. Tremors: none  DTR's 2/4 in UE/LE    No flowsheet data found. MMSE - Mini Mental State Exam 04/15/2021  Orientation to time 0  Orientation to Place 0  Registration 3  Attention/ Calculation 0  Recall 0  Language- name 2 objects 2  Language- repeat 1  Language- follow 3 step command  3  Language- read & follow direction 1  Write a sentence 1  Copy design 0  Total score 11    [employer] Mental Exam 02/27/2020  Weekday Correct 0  Current year 0  What state are we in? 1  Amount spent 0  Amount left 0  # of Animals  1  5 objects recall 0  Number series 2  Hour markers 1  Time correct 0  Placed X in triangle correctly 1  Largest Figure 1  Name of female 2  Date back to work 2  Type of work 0  State she lived in 0  Total score 11       Movement examination: Tone: There is normal tone in the UE/LE Abnormal movements:  no tremor.  No myoclonus.  No asterixis.   Coordination:  There is no decremation with RAM's. Normal finger to nose  Gait and Station: The patient has no difficulty arising out of a deep-seated chair without the use of the hands. Uses a right cane The patient's stride length is good.  Gait is cautious and narrow.        Total time spent on today's visit was  40 minutes, including both face-to-face time and nonface-to-face time. Time included that spent on review of records (prior notes available to me/labs/imaging if pertinent), discussing treatment and goals, answering patient's questions and coordinating care.  Cc:  Annita Brod, MD Marlowe Kays, PA-C

## 2021-05-04 ENCOUNTER — Ambulatory Visit: Payer: Medicare Other | Admitting: Podiatry

## 2021-05-04 ENCOUNTER — Other Ambulatory Visit: Payer: Self-pay

## 2021-05-04 DIAGNOSIS — M79674 Pain in right toe(s): Secondary | ICD-10-CM | POA: Diagnosis not present

## 2021-05-04 DIAGNOSIS — B351 Tinea unguium: Secondary | ICD-10-CM

## 2021-05-04 DIAGNOSIS — M79675 Pain in left toe(s): Secondary | ICD-10-CM | POA: Diagnosis not present

## 2021-05-08 ENCOUNTER — Encounter: Payer: Self-pay | Admitting: Podiatry

## 2021-05-08 NOTE — Progress Notes (Signed)
  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 Pain is relieved with periodic professional debridement.  Patient's daughter is present during today's visit. Patient did not check blood glucose today.  PCP is Annita Brod, MD , and last visit was <one month ago..  Allergies  Allergen Reactions   Plavix [Clopidogrel]     Family stated she had a reaction     Review of Systems: Negative except as noted in the HPI. Objective:   Constitutional Theresa Mendez is a pleasant 75 y.o. African American female, in NAD. AAO x 3.   Vascular Capillary refill time to digits immediate b/l. Palpable DP pulse(s) b/l LE. Faintly palpable PT pulse(s) b/l LE. Pedal hair sparse. No pain with calf compression b/l. Lower extremity skin temperature gradient within normal limits. Trace edema noted BLE.  Neurologic Normal speech. Oriented to person, place, and time. Protective sensation intact 5/5 intact bilaterally with 10g monofilament b/l. Vibratory sensation intact b/l.  Dermatologic Pedal integument with normal turgor, texture and tone b/l LE. No open wounds b/l. No interdigital macerations b/l. Toenails 1-5 b/l elongated, thickened, discolored with subungual debris. +Tenderness with dorsal palpation of nailplates. No hyperkeratotic or porokeratotic lesions present.  Orthopedic: Normal muscle strength 5/5 to all lower extremity muscle groups bilaterally. No pain, crepitus or joint limitation noted with ROM b/l LE. HAV with bunion deformity noted b/l LE. Hammertoe deformity noted 2-5 b/l.Marland Kitchen Patient ambulates with cane assistance.   Radiographs: None   Assessment:  No diagnosis found. Plan:  Patient was evaluated and treated and all questions answered. Consent given for treatment as described  below: -No new findings. No new orders. -Continue foot and shoe inspections daily. Monitor blood glucose per PCP/Endocrinologist's recommendations. 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 -Patient/POA to call should there be question/concern in the interim.  Return in about 3 months (around 08/04/2021).  Freddie Breech, DPM

## 2021-05-10 ENCOUNTER — Telehealth: Payer: Self-pay | Admitting: Physician Assistant

## 2021-05-10 NOTE — Telephone Encounter (Signed)
Pt's sister called in and left a message with the access nurse. She has a question about the patient's last visit.

## 2021-05-10 NOTE — Telephone Encounter (Signed)
Spoke with pt sister yes its normal for dementia pt to have good and bad periods and certain things can trigger change In mood, pt asked if sister having a lock on her door was a good idea, she was advised probably not  just so that they could keep her safe a lock on the bedroom door was not a good idea.

## 2021-06-30 NOTE — Progress Notes (Signed)
 Chief Complaint:  Chief Complaint  Patient presents with  . Back Pain    F/U; lower back-right side; pain radiates into right leg    Impression: 1. DDD (degenerative disc disease), lumbar      2. Spinal stenosis, lumbar region without neurogenic claudication        Plan:    1. Injections: Plan for bilateral L4-5 TFESI in [place]. Is not on any blood thinners.   2. Adjuvant Medications: Continue medications as prescribed including Cymbalta per primary care.  minimize adjunctive medication as she is afraid of side effects affecting her already significant memory issues.  3. Opioid medications: None prescribed. would use caution as patient has been diagnosed with Alzheimer's and struggles with memory issues.  4. Follow up: Follow up for bilateral L4-5 TFESI.  5. The patient's blood pressure was 131/79    History of Present Illness:  Theresa Mendez is a 76 y.o.year old female with a history of Bilateral low back pain with some radiation through lateral legs.  Patient does have significant medical history of Alzheimer's, CVA. Theresa Mendez returns to the clinic today having last been seen on 06/16/2020. At this time, the patient continued on a regimen of the following medication(s):  Current Home Medications   Medication Sig  amLODIPine  besylate (NORVASC ) 2.5 mg tablet Take one tablet (2.5 mg dose) by mouth daily.  buPROPion  (WELLBUTRIN ) 75 mg tablet Take one tablet (75 mg dose) by mouth 2 (two) times daily.  DULoxetine HCl (CYMBALTA) 30 mg capsule TAKE ONE CAPSULE (30 MG DOSE) BY MOUTH DAILY.  hydrALAZINE HCl (APRESOLINE) 100 mg tablet Take one tablet (100 mg dose) by mouth 3 (three) times a day.  lisinopril  (PRINIVIL ,ZESTRIL ) 40 mg tablet Take one tablet (40 mg dose) by mouth daily.  memantine  (NAMENDA ) 5 mg tablet Take one tablet (5 mg dose) by mouth 2 (two) times daily. Take 7 mg daily  metoprolol  tartrate (LOPRESSOR ) 100 mg tablet TAKE ONE TABLET 2 TIMES DAILY. CONTACT DR. WINN  ([place]) TO GET MAIL SCRIPT/REFILLS.  metoprolol  tartrate (LOPRESSOR ) 100 mg tablet Take one tablet (100 mg dose) by mouth 2 (two) times daily. This is one month supply. Please contact Dr. WINN office in [place] to get your mail script/refills.  potassium chloride (K+8,KLOR CON) 8 mEq CR tablet Take one tablet (8 mEq dose) by mouth daily.  sitaGLIPtin (JANUVIA) 100 mg tablet Take by mouth.  spironolactone (ALDACTONE) 100 MG tablet Take 25 mg by mouth daily.    Since having last been seen, Theresa Mendez' s pain is fluctuating. She is here with her sister who notes that over the past year she was walking very well without issues, however lately has been struggling with walking due to her pain in her lower back. Sometimes it is her right leg, other times her left. The patient notes pain in her left lower back today, however her sister does note that earlier she was complaining of pain in her right lower back.   Improvement from treatments: See below Side effects from medicines: N/A Activity Level- _adequate [place] Behavior- _nil  Procedures: 03/04/2020 bilateral L4-5 TFESI-patient states no improvement but has no more radicular symptoms in office today.  Images: Results for orders placed in visit on 12/31/19  MRI Spine  Lumbar WO IV Contrast  Narrative INDICATION: Back pain or radiculopathy, > 6 wks  COMPARISON: Plain films of the lumbar spine same date. Previous MR lumbar spine 02/20/2018.  TECHNIQUE: MRI SPINE LUMBAR WO IV CONTRAST.  FINDINGS: #  Impression Osseous structures:  Vertebral body heights are maintained. No acute fracture. No pars defect appreciated. No destructive bony changes. #  Alignment:Similar grade 1 anterolisthesis L4-5 measuring approximately 5 mm. #  Conus medullaris/cauda equina: Conus appears normal and terminates at T12-L1.  #  Lower thoracic spine: No significant spinal canal or foraminal stenosis.  #  T12-L1: Unchanged. Preservation of disc  height. Small shallow central disc protrusion indents the ventral thecal sac. No significant spinal canal stenosis. No impingement of the distal spinal cord/conus or traversing nerve roots. No significant foraminal stenosis. #  L1-L2: Unchanged. Preservation of disc height. Minimal disc bulge and marginal spurring. No focal disc herniation or significant stenosis. #  L2-L3: Unchanged. Preservation of disc height. Minimal disc bulge and marginal spurring. Mild facet arthrosis. No focal disc herniation or significant stenosis. #  L3-L4: Unchanged. Preservation of disc height. Mild disc bulge and marginal spurring. Moderate facet arthrosis. Minimal right facet effusion. Thickening of the ligamentum flavum. Mild spinal canal stenosis. No focal disc herniation or impingement of the traversing nerve roots. No significant foraminal stenosis. #  L4-L5: Unchanged. There is again mild loss of disc height. Grade 1 anterolisthesis. Generalized disc bulge. Severe facet arthrosis with hypertrophic changes and small effusions. Thickening of the ligamentum flavum. Severe spinal canal/lateral recess stenosis. Moderate bilateral foraminal stenosis, worse on the left. 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 #  L5-S1: Unchanged. Preservation of disc height. Mild disc bulge and marginal spurring. Mild facet arthrosis. No focal disc herniation or significant stenosis.  #  Paraspinal tissues: Unremarkable  #  Contrast: None given.  #  Additional comments: None.   IMPRESSION: 1.  Similar multilevel DDD and facet arthrosis.[name] FINDINGS again most prominent at L4-5 with there is grade 1 degenerative spondylolisthesis, generalized disc bulge and posterior hypertrophic changes resulting in severe spinal canal/lateral recess stenosis as well as moderate bilateral foraminal stenosis. Mild spinal canal stenosis L3-4. Spinal canal and neural foramina otherwise widely  patent.. 2.  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 3.  No acute fracture.  Electronically Signed by: Elsie Dayhoff    Narcotic Violations: N/A   Pill count: N/A  Last RUDS N/A   Past Medical History:  History reviewed. No pertinent past medical history.  Psychiatric History:  positive for fatigue and Memory issues  Past Surgical History:  History reviewed. No pertinent surgical history.  Allergies:  Allergies  Allergen Reactions  . Simvastatin Other  . Clopidogrel Other    Family stated she had a reaction     Medications History:  Outpatient Medications Marked as Taking for the 06/30/21 encounter (Office Visit) with Kao Ly, NP  Medication Sig Dispense Refill  . amLODIPine  besylate (NORVASC ) 2.5 mg tablet Take one tablet (2.5 mg dose) by mouth daily.    . buPROPion  (WELLBUTRIN ) 75 mg tablet Take one tablet (75 mg dose) by mouth 2 (two) times daily.    . DULoxetine HCl (CYMBALTA) 30 mg capsule TAKE ONE CAPSULE (30 MG DOSE) BY MOUTH DAILY. 30 capsule 2  . hydrALAZINE HCl (APRESOLINE) 100 mg tablet Take one tablet (100 mg dose) by mouth 3 (three) times a day.    . lisinopril  (PRINIVIL ,ZESTRIL ) 40 mg tablet Take one tablet (40 mg dose) by mouth daily.    . memantine  (NAMENDA ) 5 mg tablet Take one tablet (5 mg dose) by mouth 2 (two) times daily. Take 7 mg daily    . metoprolol  tartrate (LOPRESSOR )  100 mg tablet TAKE ONE TABLET 2 TIMES DAILY. CONTACT DR. WINN ([place]) TO GET MAIL SCRIPT/REFILLS. 60 tablet 0  . metoprolol  tartrate (LOPRESSOR ) 100 mg tablet Take one tablet (100 mg dose) by mouth 2 (two) times daily. This is one month supply. Please contact Dr. WINN office in [place] to get your mail script/refills. 180 tablet 3  . potassium chloride (K+8,KLOR CON) 8 mEq CR tablet Take one tablet (8 mEq dose) by mouth daily.    . sitaGLIPtin (JANUVIA) 100 mg tablet Take by mouth.    .  spironolactone (ALDACTONE) 100 MG tablet Take 25 mg by mouth daily.      Social History:  Social History   Socioeconomic History  . Marital status: Married    Spouse name: Not on file  . Number of children: Not on file  . Years of education: Not on file  . Highest education level: Not on file  Occupational History  . Not on file  Tobacco Use  . Smoking status: Former    Packs/day: 0.25    Types: Cigarettes    Quit date: 06/13/2006    Years since quitting: 15.0  . Smokeless tobacco: Never  Vaping Use  . Vaping Use: Never used  Substance and Sexual Activity  . Alcohol use: Never  . Drug use: Never  . Sexual activity: Not on file  Other Topics Concern  . Not on file  Social History Narrative  . Not on file   Social Determinants of Health   Financial Resource Strain: Not on file  Food Insecurity: No Food Insecurity  . Worried About Programme researcher, broadcasting/film/video in the Last Year: Never true  . Ran Out of Food in the Last Year: Never true  Transportation Needs: Not on file  Physical Activity: Not on file  Stress: Not on file  Social Connections: Not on file  Intimate Partner Violence: Not on file  Housing Stability: Not on file    Family History:  Family History  Problem Relation Age of Onset  . Stroke Paternal Grandfather     Review of Systems:    Review of Systems  Musculoskeletal: Positive for arthralgias, back pain and gait problem.   Denies new numbness or weakness     Denies current chest pain, SOB, Oversedation     Physical Exam:   BP: 131/79 Resp: 16    Pulse: 71        SPO2:     General:  Alert and oriented, No acute distress.   Eye:  Pupils are equal, round and reactive to light, Normal conjunctiva.   Respiratory:  Respirations are non-labored, Symmetrical chest wall expansion.   Cardiovascular:  Normal rate, Normal peripheral perfusion.   Integumentary:  Warm, Dry, Pink, No rash.     Cognition and Speech:  Oriented, Speech clear and coherent.    Psychiatric:  Cooperative, Appropriate mood & affect, Normal judgment.       Back: Negative for Levo/Dextro Scoliosis and thoracic kyphosis. Lumbar flexion 60-70, lumbar extension, 5, limited by concordant pain in the lumbar midline or paravertebral area. Facet loading maneuvers are positive bilaterally.     Neuro: muscle tone and strength normal and symmetric, reflexes normal and symmetric, sensation grossly normal and stiff legged, slow gait with walking cane   Patient's pain was assessed, documented as positive, unless stated in the HPI, and a follow up plan has been documented in the Plan. Patient's medication list was reviewed and updated if applicable.  Body Mass Index (BMI)  screening was documented and if the patient's BMI was greater than 25 or less than 18.5, the patient was asked to follow up with their PCP for a full assessment regarding weight management. If Fluoroscopy was used during a procedure, the radiation exposure time and number of images were documented within the record.   [place]  Prescription Monitoring Program was reviewed and appeared appropriate.   Kao Ly, NP 06/30/2021 / 1:21 PM

## 2021-07-15 NOTE — Progress Notes (Signed)
 Patient presents for LOE  Partially edentulous maxillary and mandibular arch. No radiographs or diagnostic tests done today. Patient will forward records from previous dentist.   Pros consult by Dr. Joelene. He confirmed  an upper and lower partial are viable options.   NV: Comprehensive eval with Dr. Joelene.

## 2021-07-22 NOTE — Progress Notes (Signed)
 Date of service: 07/22/2021  SURGEON: Alm Lower MD  PREOPERATIVE DIAGNOSIS:  Lumbar Degenerative Disc Disease  POSTOPERATIVE DIAGNOSIS:  Lumbar Degenerative Disc Disease  PROCEDURE:  bilateral L4-5 NERVE ROOT BLOCK/TRANSFORAMINAL EPIDURAL INJECTION WITH FLUOROSCOPY   LEVELS TREATED: bilateral L4-5  THERAPEUTIC AGENT: 1cc 0.25% bupivacaine + 40mg  of kenalog + 1 cc Isovue 200 per level  BLOOD LOSS: minimal  COMPLICATIONS: none  DESCRIPTION:  After obtaining informed consent, the patient was positioned on the padded fluoroscopy table. Fluoroscopy was utilized to determine the site for needle placement. The skin at this site was prepped with antiseptic solution and draped in the usual fashion. Local anesthesia was administered. A sterile styletted block needle was advanced through the anesthetized skin and manipulated into position in the neuroforamen. Aspiration was then negative for heme or air. Radiographic contrast was administered through the needle under fluoroscopic observation. The contrast spread confirmed needle-tip placement adjacent to the nerve and showed spread of contrast medial to the pedicles within the epidural space but no vascular uptake. The therapeutic agent was administered in increments. The needle was removed intact and discarded.   The patient tolerated the procedure well and recovered uneventfully in the pain center recovery room. The procedure was performed as stated above. Discharge instructions were given.   ADDENDUM:  Fluoroscopy time and Exposure, mRad were recorded and scanned into chart for procedure. Image count stored.

## 2021-08-06 ENCOUNTER — Ambulatory Visit (INDEPENDENT_AMBULATORY_CARE_PROVIDER_SITE_OTHER): Payer: Medicare Other | Admitting: Podiatry

## 2021-08-06 DIAGNOSIS — Z91199 Patient's noncompliance with other medical treatment and regimen due to unspecified reason: Secondary | ICD-10-CM

## 2021-08-10 NOTE — Progress Notes (Signed)
   Complete physical exam  Patient: Theresa Mendez   DOB: 04/02/1999   76 y.o. Female  MRN: [id number]  Subjective:    No chief complaint on file.   Theresa Mendez is a 76 y.o. female who presents today for a complete physical exam. She reports consuming a {diet types:17450} diet. {types:19826} She generally feels {DESC; WELL/FAIRLY WELL/POORLY:18703}. She reports sleeping {DESC; WELL/FAIRLY WELL/POORLY:18703}. She {does/does not:200015} have additional problems to discuss today.    Most recent fall risk assessment:    12/08/2021   10:42 AM  Fall Risk   Falls in the past year? 0  Number falls in past yr: 0  Injury with Fall? 0  Risk for fall due to : No Fall Risks  Follow up Falls evaluation completed     Most recent depression screenings:    12/08/2021   10:42 AM 10/29/2020   10:46 AM  PHQ 2/9 Scores  PHQ - 2 Score 0 0  PHQ- 9 Score 5     {VISON DENTAL STD PSA (Optional):27386}  {History (Optional):23778}  Patient Care Team: Jessup, Joy, NP as PCP - General (Nurse Practitioner)   Outpatient Medications Prior to Visit  Medication Sig   fluticasone (FLONASE) 50 MCG/ACT nasal spray Place 2 sprays into both nostrils in the morning and at bedtime. After 7 days, reduce to once daily.   norgestimate-ethinyl estradiol (SPRINTEC 28) 0.25-35 MG-MCG tablet Take 1 tablet by mouth daily.   Nystatin POWD Apply liberally to affected area 2 times per day   spironolactone (ALDACTONE) 100 MG tablet Take 1 tablet (100 mg total) by mouth daily.   No facility-administered medications prior to visit.    ROS        Objective:     There were no vitals taken for this visit. {Vitals History (Optional):23777}  Physical Exam   No results found for any visits on 01/13/22. {Show previous labs (optional):23779}    Assessment & Plan:    Routine Health Maintenance and Physical Exam  Immunization History  Administered Date(s) Administered   DTaP 06/16/1999, 08/12/1999,  10/21/1999, 07/06/2000, 01/20/2004   Hepatitis A 11/16/2007, 11/21/2008   Hepatitis B 04/03/1999, 05/11/1999, 10/21/1999   HiB (PRP-OMP) 06/16/1999, 08/12/1999, 10/21/1999, 07/06/2000   IPV 06/16/1999, 08/12/1999, 04/10/2000, 01/20/2004   Influenza,inj,Quad PF,6+ Mos 02/21/2014   Influenza-Unspecified 05/23/2012   MMR 04/10/2001, 01/20/2004   Meningococcal Polysaccharide 11/21/2011   Pneumococcal Conjugate-13 07/06/2000   Pneumococcal-Unspecified 10/21/1999, 01/04/2000   Tdap 11/21/2011   Varicella 04/10/2000, 11/16/2007    Health Maintenance  Topic Date Due   HIV Screening  Never done   Hepatitis C Screening  Never done   INFLUENZA VACCINE  01/11/2022   PAP-Cervical Cytology Screening  01/13/2022 (Originally 04/01/2020)   PAP SMEAR-Modifier  01/13/2022 (Originally 04/01/2020)   TETANUS/TDAP  01/13/2022 (Originally 11/20/2021)   HPV VACCINES  Discontinued   COVID-19 Vaccine  Discontinued    Discussed health benefits of physical activity, and encouraged her to engage in regular exercise appropriate for her age and condition.  Problem List Items Addressed This Visit   None Visit Diagnoses     Annual physical exam    -  Primary   Cervical cancer screening       Need for Tdap vaccination          No follow-ups on file.     Joy Jessup, NP   

## 2021-08-17 DIAGNOSIS — R2681 Unsteadiness on feet: Secondary | ICD-10-CM | POA: Insufficient documentation

## 2021-08-31 ENCOUNTER — Other Ambulatory Visit: Payer: Self-pay

## 2021-08-31 ENCOUNTER — Ambulatory Visit (INDEPENDENT_AMBULATORY_CARE_PROVIDER_SITE_OTHER): Payer: Medicare Other | Admitting: Podiatry

## 2021-08-31 ENCOUNTER — Encounter: Payer: Self-pay | Admitting: Podiatry

## 2021-08-31 DIAGNOSIS — M79674 Pain in right toe(s): Secondary | ICD-10-CM

## 2021-08-31 DIAGNOSIS — E119 Type 2 diabetes mellitus without complications: Secondary | ICD-10-CM

## 2021-08-31 DIAGNOSIS — M79675 Pain in left toe(s): Secondary | ICD-10-CM | POA: Diagnosis not present

## 2021-08-31 DIAGNOSIS — B351 Tinea unguium: Secondary | ICD-10-CM

## 2021-08-31 DIAGNOSIS — M898X7 Other specified disorders of bone, ankle and foot: Secondary | ICD-10-CM | POA: Diagnosis not present

## 2021-08-31 DIAGNOSIS — M2011 Hallux valgus (acquired), right foot: Secondary | ICD-10-CM

## 2021-08-31 DIAGNOSIS — M2042 Other hammer toe(s) (acquired), left foot: Secondary | ICD-10-CM

## 2021-08-31 DIAGNOSIS — M2012 Hallux valgus (acquired), left foot: Secondary | ICD-10-CM

## 2021-08-31 DIAGNOSIS — M2041 Other hammer toe(s) (acquired), right foot: Secondary | ICD-10-CM | POA: Diagnosis not present

## 2021-09-05 ENCOUNTER — Encounter: Payer: Self-pay | Admitting: Podiatry

## 2021-09-05 NOTE — Progress Notes (Signed)
ANNUAL DIABETIC FOOT EXAM ? ?Subjective: ?Theresa Mendez presents today for annual diabetic foot examination. ? ?Patient relates several year h/o diabetes. ? ?Patient denies any h/o foot wounds. ? ?Patient denies any numbness, tingling, burning, or pins/needle sensation in feet. ? ?Patient did not check blood glucose this morning. ? ?Risk factors: diabetes, h/o CVA, HTN, hyperlipidemia, h/o tobacco use in remission. ? ?Theresa Brod, MD is patient's PCP. Last visit was February, 2023. ? ?She is accompanied by her sister on today's visit. ? ?Past Medical History:  ?Diagnosis Date  ? Alzheimer disease (HCC)   ? Dementia (HCC)   ? Diabetes mellitus without complication (HCC)   ? Hypertension   ? ?Patient Active Problem List  ? Diagnosis Date Noted  ? Unsteady gait when walking 08/17/2021  ? Chronic anxiety 01/26/2021  ? History of cerebrovascular accident 01/26/2021  ? Muscle pain 01/26/2021  ? Palpitations 01/26/2021  ? Cough 09/03/2020  ? CAP (community acquired pneumonia) 05/06/2020  ? Hyperglycemia due to diabetes mellitus (HCC) 05/06/2020  ? Leukocytosis 05/06/2020  ? Viral pneumonia 05/06/2020  ? Type II diabetes mellitus, uncontrolled 04/17/2020  ? Low back pain 11/26/2019  ? Uncontrolled type 2 diabetes mellitus with complication, without long-term current use of insulin 11/26/2015  ? Sleep disorder 03/02/2015  ? Hyperlipidemia associated with type 2 diabetes mellitus (HCC) 04/24/2014  ? Hypertension associated with diabetes (HCC) 04/24/2014  ? Anxiety and depression 03/21/2013  ? Alzheimer's disease (HCC) 09/18/2012  ? S/P colonoscopy with polypectomy 10/06/2011  ? Spinal stenosis of lumbar region 07/29/2010  ? LVH (left ventricular hypertrophy) 08/05/2009  ? Vitamin D deficiency 08/05/2009  ? DJD (degenerative joint disease), cervical 05/20/2009  ? Stroke (HCC) 05/20/2009  ? Breast calcification seen on mammogram 01/26/2009  ? ?Past Surgical History:  ?Procedure Laterality Date  ? ABDOMINAL HYSTERECTOMY    ?  COLON SURGERY    ? ?Current Outpatient Medications on File Prior to Visit  ?Medication Sig Dispense Refill  ? acetaminophen (TYLENOL) 325 MG tablet Take 650 mg by mouth every 6 (six) hours as needed for moderate pain.     ? amLODipine (NORVASC) 2.5 MG tablet Take 2.5 mg by mouth daily.     ? amLODipine (NORVASC) 5 MG tablet amlodipine 5 mg tablet ? TAKE 1 TABLET BY MOUTH EVERY DAY    ? atovaquone (MEPRON) 750 MG/5ML suspension atovaquone 750 mg/5 mL oral suspension ? TAKE 5 ML ORAL SUSPENSION 2 TIMES A DAY TREAT TOXOPLASMOSIS    ? azithromycin (ZITHROMAX) 250 MG tablet azithromycin 250 mg tablet ? TAKE 2 TABLETS ON 1ST DAY. TAKE 1 TABLET TIMES 5 DAYS TREAT M AND C PNEUMONIAE BACTERIA    ? buPROPion (WELLBUTRIN) 75 MG tablet bupropion HCl 75 mg tablet ? Take 1 tablet twice a day by oral route.    ? buPROPion (WELLBUTRIN) 75 MG tablet Take by mouth.    ? chlorhexidine (PERIDEX) 0.12 % solution SMARTSIG:By Mouth    ? clobetasol ointment (TEMOVATE) 0.05 % clobetasol 0.05 % topical ointment ? PLEASE SEE ATTACHED FOR DETAILED DIRECTIONS    ? DERMA-SMOOTHE/FS SCALP 0.01 % OIL SMARTSIG:1 Sparingly Topical Once a Week PRN    ? Diclofenac Sodium 3 % GEL diclofenac 3 % topical gel ? APPLY TO LESION AREAS BY TOPICAL ROUTE 2 TIMES PER DAY    ? docusate sodium (COLACE) 100 MG capsule Take 100 mg by mouth daily.    ? donepezil (ARICEPT) 10 MG tablet Aricept 10 mg tablet ? Take 1 tablet every day by oral  route.    ? doxycycline (ADOXA) 100 MG tablet doxycycline monohydrate 100 mg tablet    ? DULoxetine (CYMBALTA) 30 MG capsule duloxetine 30 mg capsule,delayed release ? TAKE ONE CAPSULE BY MOUTH EVERY DAY    ? DULoxetine (CYMBALTA) 60 MG capsule Cymbalta 60 mg capsule,delayed release ? Take 1 capsule every day by oral route.    ? guaiFENesin (MUCINEX) 600 MG 12 hr tablet guaifenesin ER 600 mg tablet, extended release 12 hr ? Take 1 tablet every 12 hours by oral route for 7 days.    ? hydrALAZINE (APRESOLINE) 100 MG tablet Take 100  mg by mouth 3 (three) times daily.    ? hydrOXYzine (ATARAX/VISTARIL) 10 MG tablet Take 10 mg by mouth at bedtime.    ? ketoconazole (NIZORAL) 2 % cream Apply topically 2 (two) times daily as needed.    ? levocetirizine (XYZAL) 5 MG tablet levocetirizine 5 mg tablet ? TAKE 1 TABLET BY MOUTH EVERY DAY    ? levofloxacin (LEVAQUIN) 750 MG tablet Take 750 mg by mouth daily.    ? liothyronine (CYTOMEL) 5 MCG tablet liothyronine 5 mcg tablet ? TAKE 1 TO 2 TABLETS IN THE AM BEFORE FOOD DRINK OR SUPPLEMENTS    ? lisinopril (PRINIVIL,ZESTRIL) 40 MG tablet Take 40 mg by mouth daily.    ? memantine (NAMENDA) 10 MG tablet memantine 10 mg tablet ? TAKE 1 TABLET BY MOUTH TWICE A DAY    ? memantine (NAMENDA) 5 MG tablet memantine 5 mg tablet ? TAKE ONE TABLET BY MOUTH TWICE A DAY    ? memantine (NAMENDA) 5 MG tablet Take by mouth.    ? metFORMIN (GLUCOPHAGE) 500 MG tablet Take 500 mg by mouth 2 (two) times daily.    ? metFORMIN (GLUCOPHAGE) 500 MG tablet metformin 500 mg tablet ? Take 1 tablet twice a day by oral route.    ? metoprolol tartrate (LOPRESSOR) 100 MG tablet metoprolol tartrate 100 mg tablet ? Take 1 tablet every day by oral route.    ? naproxen (NAPROSYN) 375 MG tablet     ? naproxen sodium (ANAPROX) 550 MG tablet Take 550 mg by mouth 2 (two) times daily.    ? potassium chloride (KLOR-CON) 8 MEQ tablet Take by mouth.    ? sitaGLIPtin (JANUVIA) 100 MG tablet Januvia 100 mg tablet ? Take 1 tablet every day by oral route.    ? spironolactone (ALDACTONE) 25 MG tablet Take 0.5 tablets by mouth daily.    ? triamcinolone (KENALOG) 0.1 %     ? ?No current facility-administered medications on file prior to visit.  ?  ?Allergies  ?Allergen Reactions  ? Plavix [Clopidogrel]   ?  Family stated she had a reaction   ? ?Social History  ? ?Occupational History  ? Not on file  ?Tobacco Use  ? Smoking status: Former  ?  Packs/day: 0.50  ?  Years: 8.00  ?  Pack years: 4.00  ?  Types: Cigarettes  ? Smokeless tobacco: Never  ?Vaping Use   ? Vaping Use: Never used  ?Substance and Sexual Activity  ? Alcohol use: No  ? Drug use: No  ? Sexual activity: Not on file  ? ?Family History  ?Problem Relation Age of Onset  ? Dementia Mother   ? Dementia Sister   ? Dementia Maternal Grandmother   ? ? ?There is no immunization history on file for this patient.  ? ?Review of Systems: Negative except as noted in the HPI.  ? ?Objective: ?  There were no vitals filed for this visit. ? ?Theresa Mendez is a pleasant 76 y.o. female in NAD. AAO X 3. ? ?Vascular Examination: ?CFT <3 seconds b/l LE. Palpable DP pulse(s) b/l LE. Palpable PT pulse(s) b/l LE. Pedal hair absent. No pain with calf compression b/l. No edema noted b/l LE. No ischemia or gangrene noted b/l LE. No cyanosis or clubbing noted b/l LE. ? ?Dermatological Examination: ?Pedal integument with normal turgor, texture and tone BLE. No open wounds b/l LE. No interdigital macerations noted b/l LE. Toenails 1-5 b/l elongated, discolored, dystrophic, thickened, crumbly with subungual debris and tenderness to dorsal palpation. No hyperkeratotic nor porokeratotic lesions present on today's visit. ? ?Neurological Examination: ?Protective sensation intact 5/5 intact bilaterally with 10g monofilament b/l. Vibratory sensation intact b/l. ? ?Musculoskeletal Examination: ?Muscle strength 5/5 to all lower extremity muscle groups bilaterally. Palpable exostosis noted midfoot joint of b/l feet. HAV with bunion deformity noted b/l LE. Hammertoe deformity noted 2-5 b/l. ? ?Footwear Assessment: ?Does the patient wear appropriate shoes? Yes. ?Does the patient need inserts/orthotics? No. ? ?Assessment: ?1. Pain due to onychomycosis of toenails of both feet   ?2. Hallux valgus, acquired, bilateral   ?3. Acquired hammertoes of both feet   ?4. Exostosis of foot   ?5. Controlled type 2 diabetes mellitus without complication, without long-term current use of insulin (HCC)   ?6. Encounter for diabetic foot exam (HCC)   ?  ?ADA Risk  Categorization: ?Low Risk :  ?Patient has all of the following: ?Intact protective sensation ?No prior foot ulcer  ?No severe deformity ?Pedal pulses present ? ?Plan: ?-Patient was evaluated and treated. All p

## 2021-09-06 ENCOUNTER — Other Ambulatory Visit: Payer: Self-pay | Admitting: Student

## 2021-09-06 DIAGNOSIS — R42 Dizziness and giddiness: Secondary | ICD-10-CM

## 2021-09-07 ENCOUNTER — Telehealth: Payer: Self-pay | Admitting: Physician Assistant

## 2021-09-07 ENCOUNTER — Other Ambulatory Visit: Payer: Self-pay | Admitting: Physician Assistant

## 2021-09-07 NOTE — Telephone Encounter (Signed)
Caller states that her sister is agitated.  She states the agitation has increased.   ? ?Access note in mailbox ?09/06/2021 ?4:58PM ?

## 2021-09-07 NOTE — Telephone Encounter (Signed)
Advised to have UA check. Thanked me for calling.  ?

## 2021-09-08 ENCOUNTER — Ambulatory Visit
Admission: RE | Admit: 2021-09-08 | Discharge: 2021-09-08 | Disposition: A | Discharge status: Home / Self Care | Payer: Medicare Other | Source: Ambulatory Visit | Attending: Student | Admitting: Student

## 2021-09-08 DIAGNOSIS — R42 Dizziness and giddiness: Secondary | ICD-10-CM

## 2021-09-20 NOTE — Progress Notes (Signed)
 Chief Complaint:  Chief Complaint  Patient presents with  . Procedure    F/U; Procedure on 07/22/21; Bilateral L4-5 TFESI; Helped; still has some pain; pain is on and off    Impression: 1. DDD (degenerative disc disease), lumbar      2. Facet arthropathy, lumbar      3. Lumbar spondylosis        Plan:    1. Injections: None needed at this time.  I did discuss with the patient and her family member that we can repeat TFESI's every 3 to 4 months if appropriate.  2. Adjuvant Medications: Continue medications as prescribed including Cymbalta per primary care.  minimize adjunctive medication as she is afraid of side effects affecting her already significant memory issues.  Specialized treatments: currently in PT once a week; discussed importance of lifelong HEs  3. Opioid medications: None prescribed. would use caution as patient has been diagnosed with Alzheimer's and struggles with memory  4. Follow up: Follow up if symptoms worsen or fail to improve, for re-evaluation with Kao NP.  5. The patient's blood pressure was 147/85    History of Present Illness:  Theresa Mendez is a 76 y.o.year old female with a history of Bilateral low back pain with some radiation through lateral legs.  Patient does have significant medical history of Alzheimer's, CVA. Darrel returns to the clinic today having last been seen on 07/22/2021 for procedure.  At this time, the patient continued on a regimen of the following medication(s):  Current Home Medications   Medication Sig  amLODIPine  besylate (NORVASC ) 2.5 mg tablet Take one tablet (2.5 mg dose) by mouth daily.  buPROPion  (WELLBUTRIN ) 75 mg tablet Take one tablet (75 mg dose) by mouth 2 (two) times daily.  DULoxetine HCl (CYMBALTA) 30 mg capsule TAKE ONE CAPSULE (30 MG DOSE) BY MOUTH DAILY.  hydrALAZINE HCl (APRESOLINE) 100 mg tablet Take one tablet (100 mg dose) by mouth 3 (three) times a day.  lisinopril  (PRINIVIL ,ZESTRIL ) 40 mg tablet Take one  tablet (40 mg dose) by mouth daily.  memantine  (NAMENDA ) 5 mg tablet Take one tablet (5 mg dose) by mouth 2 (two) times daily. Take 7 mg daily  metoprolol  tartrate (LOPRESSOR ) 100 mg tablet TAKE ONE TABLET 2 TIMES DAILY. CONTACT DR. WINN ([place]) TO GET MAIL SCRIPT/REFILLS.  metoprolol  tartrate (LOPRESSOR ) 100 mg tablet Take one tablet (100 mg dose) by mouth 2 (two) times daily. This is one month supply. Please contact Dr. WINN office in [place] to get your mail script/refills.  potassium chloride (K+8,KLOR CON) 8 mEq CR tablet Take one tablet (8 mEq dose) by mouth daily.  sitaGLIPtin (JANUVIA) 100 mg tablet Take by mouth.  spironolactone (ALDACTONE) 100 MG tablet Take 25 mg by mouth daily.    Since having last been seen, Theresa Mendez' s pain seems to be more stable.  Her sister accompanies her today and does feel in the gaps of her history secondary to her Alzheimer's.  The sister notices that she has been walking much better since her transforaminal epidural steroid injection, intermittently complains of pain in her left lower back however today notes that her pain is in her right buttock.  No other issues or complaints today.  Improvement from treatments: See below Side effects from medicines: N/A Activity Level- _adequate [place] Behavior- _nil  Procedures: 03/04/2020 bilateral L4-5 TFESI-patient states no improvement but has no more radicular symptoms in office today. 07/22/2021 -bilateral L4-5 TFESI - family verbalized that patient is not complaining of pain as much,  and is walking much better. Denies radicular sxs today  Images: Results for orders placed in visit on 12/31/19  MRI Spine  Lumbar WO IV Contrast  Narrative INDICATION: Back pain or radiculopathy, > 6 wks  COMPARISON: Plain films of the lumbar spine same date. Previous MR lumbar spine 02/20/2018.  TECHNIQUE: MRI SPINE LUMBAR WO IV CONTRAST.  FINDINGS: #  Impression Osseous structures: Vertebral  body heights are maintained. No acute fracture. No pars defect appreciated. No destructive bony changes. #  Alignment:Similar grade 1 anterolisthesis L4-5 measuring approximately 5 mm. #  Conus medullaris/cauda equina: Conus appears normal and terminates at T12-L1.  #  Lower thoracic spine: No significant spinal canal or foraminal stenosis.  #  T12-L1: Unchanged. Preservation of disc height. Small shallow central disc protrusion indents the ventral thecal sac. No significant spinal canal stenosis. No impingement of the distal spinal cord/conus or traversing nerve roots. No significant foraminal stenosis. #  L1-L2: Unchanged. Preservation of disc height. Minimal disc bulge and marginal spurring. No focal disc herniation or significant stenosis. #  L2-L3: Unchanged. Preservation of disc height. Minimal disc bulge and marginal spurring. Mild facet arthrosis. No focal disc herniation or significant stenosis. #  L3-L4: Unchanged. Preservation of disc height. Mild disc bulge and marginal spurring. Moderate facet arthrosis. Minimal right facet effusion. Thickening of the ligamentum flavum. Mild spinal canal stenosis. No focal disc herniation or impingement of the traversing nerve roots. No significant foraminal stenosis. #  L4-L5: Unchanged. There is again mild loss of disc height. Grade 1 anterolisthesis. Generalized disc bulge. Severe facet arthrosis with hypertrophic changes and small effusions. Thickening of the ligamentum flavum. Severe spinal canal/lateral recess stenosis. Moderate bilateral foraminal stenosis, worse on the left. 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 #  L5-S1: Unchanged. Preservation of disc height. Mild disc bulge and marginal spurring. Mild facet arthrosis. No focal disc herniation or significant stenosis.  #  Paraspinal tissues: Unremarkable  #  Contrast: None given.  #  Additional comments: None.   IMPRESSION: 1.   Similar multilevel DDD and facet arthrosis.[name] FINDINGS again most prominent at L4-5 with there is grade 1 degenerative spondylolisthesis, generalized disc bulge and posterior hypertrophic changes resulting in severe spinal canal/lateral recess stenosis as well as moderate bilateral foraminal stenosis. Mild spinal canal stenosis L3-4. Spinal canal and neural foramina otherwise widely patent.. 2.  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 3.  No acute fracture.  Electronically Signed by: Elsie Dayhoff   Narcotic Violations: N/A   Pill count: N/A  Last RUDS N/A   Past Medical History:  History reviewed. No pertinent past medical history.  Psychiatric History:  positive for fatigue and Memory issues  Past Surgical History:  History reviewed. No pertinent surgical history.  Allergies:  Allergies  Allergen Reactions  . Simvastatin Other  . Clopidogrel Other    Family stated she had a reaction     Medications History:  Outpatient Medications Marked as Taking for the 09/20/21 encounter (Office Visit) with Kao Ly, NP  Medication Sig Dispense Refill  . amLODIPine  besylate (NORVASC ) 2.5 mg tablet Take one tablet (2.5 mg dose) by mouth daily.    . buPROPion  (WELLBUTRIN ) 75 mg tablet Take one tablet (75 mg dose) by mouth 2 (two) times daily.    . DULoxetine HCl (CYMBALTA) 30 mg capsule TAKE ONE CAPSULE (30 MG DOSE) BY MOUTH DAILY. 30 capsule 2  . hydrALAZINE HCl (APRESOLINE)  100 mg tablet Take one tablet (100 mg dose) by mouth 3 (three) times a day.    . lisinopril  (PRINIVIL ,ZESTRIL ) 40 mg tablet Take one tablet (40 mg dose) by mouth daily.    . memantine  (NAMENDA ) 5 mg tablet Take one tablet (5 mg dose) by mouth 2 (two) times daily. Take 7 mg daily    . metoprolol  tartrate (LOPRESSOR ) 100 mg tablet TAKE ONE TABLET 2 TIMES DAILY. CONTACT DR. WINN ([place]) TO GET MAIL SCRIPT/REFILLS. 60 tablet 0  . metoprolol   tartrate (LOPRESSOR ) 100 mg tablet Take one tablet (100 mg dose) by mouth 2 (two) times daily. This is one month supply. Please contact Dr. WINN office in [place] to get your mail script/refills. 180 tablet 3  . potassium chloride (K+8,KLOR CON) 8 mEq CR tablet Take one tablet (8 mEq dose) by mouth daily.    . sitaGLIPtin (JANUVIA) 100 mg tablet Take by mouth.    . spironolactone (ALDACTONE) 100 MG tablet Take 25 mg by mouth daily.      Social History:  Social History   Socioeconomic History  . Marital status: Married    Spouse name: Not on file  . Number of children: Not on file  . Years of education: Not on file  . Highest education level: Not on file  Occupational History  . Not on file  Tobacco Use  . Smoking status: Former    Packs/day: 0.25    Types: Cigarettes    Quit date: 06/13/2006    Years since quitting: 15.2  . Smokeless tobacco: Never  Vaping Use  . Vaping Use: Never used  Substance and Sexual Activity  . Alcohol use: Never  . Drug use: Never  . Sexual activity: Not on file  Other Topics Concern  . Not on file  Social History Narrative  . Not on file   Social Determinants of Health   Financial Resource Strain: Not on file  Food Insecurity: No Food Insecurity  . Worried About Programme researcher, broadcasting/film/video in the Last Year: Never true  . Ran Out of Food in the Last Year: Never true  Transportation Needs: Not on file  Physical Activity: Not on file  Stress: Not on file  Social Connections: Not on file  Intimate Partner Violence: Not on file  Housing Stability: Not on file    Family History:  Family History  Problem Relation Age of Onset  . Stroke Paternal Grandfather     Review of Systems:    Review of Systems  Musculoskeletal: Positive for arthralgias, back pain and gait problem.   Denies new numbness or weakness     Denies current chest pain, SOB, Oversedation     Physical Exam:   BP: 147/85 Resp: 16    Pulse: 61        SPO2:      General:  Alert and oriented, No acute distress.   Eye:  Pupils are equal, round and reactive to light, Normal conjunctiva.   Respiratory:  Respirations are non-labored, Symmetrical chest wall expansion.   Cardiovascular:  Normal rate, Normal peripheral perfusion.   Integumentary:  Warm, Dry, Pink, No rash.     Cognition and Speech:  Oriented, Speech clear and coherent.   Psychiatric:  Cooperative, Appropriate mood & affect, Normal judgment.       Back:  Facet loading maneuvers are negative bilaterally.     Neuro: muscle tone and strength normal and symmetric, reflexes normal and symmetric, sensation grossly normal and stiff legged, slow  gait with walking cane, but steady   Patient's pain was assessed, documented as positive, unless stated in the HPI, and a follow up plan has been documented in the Plan. Patient's medication list was reviewed and updated if applicable.  Body Mass Index (BMI) screening was documented and if the patient's BMI was greater than 25 or less than 18.5, the patient was asked to follow up with their PCP for a full assessment regarding weight management. If Fluoroscopy was used during a procedure, the radiation exposure time and number of images were documented within the record.   [facility]  Prescription Monitoring Program was reviewed and appeared appropriate.   Kao Ly, NP 09/20/2021 / 2:51 PM

## 2021-10-13 ENCOUNTER — Ambulatory Visit (INDEPENDENT_AMBULATORY_CARE_PROVIDER_SITE_OTHER): Payer: Medicare Other | Admitting: Physician Assistant

## 2021-10-13 ENCOUNTER — Encounter: Payer: Self-pay | Admitting: Physician Assistant

## 2021-10-13 VITALS — BP 164/83 | HR 97 | Resp 18 | Ht 64.0 in | Wt 169.0 lb

## 2021-10-13 DIAGNOSIS — G301 Alzheimer's disease with late onset: Secondary | ICD-10-CM | POA: Diagnosis not present

## 2021-10-13 DIAGNOSIS — F0280 Dementia in other diseases classified elsewhere without behavioral disturbance: Secondary | ICD-10-CM | POA: Diagnosis not present

## 2021-10-13 NOTE — Patient Instructions (Addendum)
1. Check on medications, how is she taking Memantine ideally 10 mg 1 times a day  ? ?3. Continue Bupropion if needed ? ?5.Continue using a walker and the cane ? ?6 Follow-up in 6 months, call for any changes ? ?7. Feel free to visit Facebook page " [employer]" for tips of how to care for people with memory problems.  ? ? ? ?FALL PRECAUTIONS: Be cautious when walking. Scan the area for obstacles that may increase the risk of trips and falls. When getting up in the mornings, sit up at the edge of the bed for a few minutes before getting out of bed. Consider elevating the bed at the head end to avoid drop of blood pressure when getting up. Walk always in a well-lit room (use night lights in the walls). Avoid area rugs or power cords from appliances in the middle of the walkways. Use a walker or a cane if necessary and consider physical therapy for balance exercise. Get your eyesight checked regularly. ? ? ?HOME SAFETY: Consider the safety of the kitchen when operating appliances like stoves, microwave oven, and blender. Consider having supervision and share cooking responsibilities until no longer able to participate in those. Accidents with firearms and other hazards in the house should be identified and addressed as well. ? ? ?ABILITY TO BE LEFT ALONE: If patient is unable to contact 911 operator, consider using LifeLine, or when the need is there, arrange for someone to stay with patients. Smoking is a fire hazard, consider supervision or cessation. Risk of wandering should be assessed by caregiver and if detected at any point, supervision and safe proof recommendations should be instituted. ? ?RECOMMENDATIONS FOR ALL PATIENTS WITH MEMORY PROBLEMS: ?1. Continue to exercise (Recommend 30 minutes of walking everyday, or 3 hours every week) ?2. Increase social interactions - continue going to [place] and enjoy social gatherings with friends and family ?3. Eat healthy, avoid fried foods and eat more fruits and vegetables ?4.  Maintain adequate blood pressure, blood sugar, and blood cholesterol level. Reducing the risk of stroke and cardiovascular disease also helps promoting better memory. ?5. Avoid stressful situations. Live a simple life and avoid aggravations. Organize your time and prepare for the next day in anticipation. ?6. Sleep well, avoid any interruptions of sleep and avoid any distractions in the bedroom that may interfere with adequate sleep quality ?7. Avoid sugar, avoid sweets as there is a strong link between excessive sugar intake, diabetes, and cognitive impairment ?The Mediterranean diet has been shown to help patients reduce the risk of progressive memory disorders and reduces cardiovascular risk. This includes eating fish, eat fruits and green leafy vegetables, nuts like almonds and hazelnuts, walnuts, and also use olive oil. Avoid fast foods and fried foods as much as possible. Avoid sweets and sugar as sugar use has been linked to worsening of memory function. ? ?There is always a concern of gradual progression of memory problems. If this is the case, then we may need to adjust level of care according to patient needs. Support, both to the patient and caregiver, should then be put into place. ? ?

## 2021-10-13 NOTE — Progress Notes (Signed)
? ?Assessment/Plan:  ? ? ?Dementia due to Alzheimer's disease with behavioral disturbance ? ?No significant changes since her last visit, She is on memantine 10 mg bid, not clear if she is taking her DOnepezil 10 mg daily. Mood is stable with bupropion.  ? ? Recommendations:  ?Discussed safety both in and out of the home.  ?Discussed the importance of regular daily schedule to maintain brain function.  ?Continue to monitor mood by PCP ?Stay active at least 30 minutes at least 3 times a week.  ?Naps should be scheduled and should be no longer than 60 minutes and should not occur after 2 PM.  ?Mediterranean diet is recommended  ?Sister to cal to reconcile meds, she is unsure if patient is taking donepezil at all  ?Continue Memantine 10 mg twice daily.Side effects were discussed  ?Continue bupropion, for the management of anxiety ?Continue 24/7 supervision ?Follow up in 6  months. ? ? ?Case discussed with Dr. Karel Jarvis who agrees with the plan ? ? ? ?Subjective:  ? ? ?Theresa Mendez is a 76 y.o. female RH with a history of hypertension, hyperlipidemia, diabetes, Alzheimer's disease with behavioral disturbance, seen today in follow-up for memory loss.  She was last seen at our office on 04/15/21 .  She is accompanied in the office by her sister, who supplements the history.  Previous records as well as any outside records available were reviewed prior to today's visit.  She is currently on memantine 10 mg twice daily, and supposed to be on donepezil 10 mg daily but sister is not sure that patient is taking it.  The caregiver reports that the memory is "not better, about the same ".  She continues to be forgetful, mood controlled with bupropion.  She continues to repeat the same stories and asked the same questions. " I work at [employer] says several times.   At times she does not know what she came to the room for and has to be redirected,  to where the bathroom is for example.   She believes that she lives in [place]. She  has short attention, gets tired with some tasks.  She continues to enjoy washing dishes frequently.  Her appetite is good, but sometimes she forgets to eat.  She does leave objects "everywhere, and she hides constantly some stuff, such as her hairbrush ".  She lives with the sitter and her roommate, who are very supportive.  Sometimes, the son comes to visit her and sleeps in the living room.  He reports that she gets up in the middle of the night at times, walks around 4 am. She only sleeps with a light on.  She denies any visual hallucinations, but she may have auditory hallucinations such as hearing the ringing when no one is doing so.  She denies paranoia.  She needs to be reminded to shower or change. Her caregiver lies clothing for her to wear.  Her sister sets a pillbox for her to take the medications.  Son takes care of the finances.  She does not drive, her sitter drives her to where she needs to go.  Her appetite is good she does not drink enough water.  She denies trouble swallowing.  She no longer cooks.  When she ambulates, she may take "too fast steps ".  She uses a cane in the right hand, but they are in the process of getting a walker for stability to prevent any falls.  No head injuries are reported.  Denies headaches,  double vision, occasionally she has dizziness, especially when not drinking.  If she drinks, she will not be dizzy.  There is no focal numbness, or tingling reported. Denies unilateral weakness or tremors, or anosmia.  No history of seizures.  She has chronic urine incontinence using diapers, denies constipation or diarrhea. ? ? ?History on Initial Assessment 02/27/2020: This is a 76 year old left-handed woman with a history of hypertension, hyperlipidemia, diabetes, dementia, presenting to establish care. She was previously living in [place] and moved to [facility] to be closer to her sister and POA. She states her memory is not good. Records were reviewed. Records indicate that  memory changes started in 2014, she tended to be repetitive.  Prior Neuropsychological testing in 2014 and 2016 indicated an amnestic profile suggestive of Alzheimer's disease. MRI brain in 2014 showed significant frontal, parietal, and temporal atrophy. No significant vascular changes. MOCA score 15/30 in 08/2017 on her last visit with her neurologist. She is on Donepezil 10mg  daily and Memantine ER 14mg  daily without side effects. She states her husband is still living in their house (he is deceased). 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 Medications are administered at [facility]. She asks "where is [facility] Her older sister with dementia also resides there. Her POA manages finances. She does not drive. She states she is independent with dressing and bathing, Verlee MonteDora reminds her that she is assisted with this. She goes to the dining hall for meals. She states sleep is good. She denies any headaches, dizziness, diplopia, dysarthria/dysphagia, focal numbness/tingling/weakness, bowel/bladder dysfunction, anosmia, or tremors. She has chronic back pain and is doing physical therapy, although she does not recall this and her sister reminds her she has it several times a week. No personality changes, paranoia. She was previously on lorazepam but has not needed it. Sometimes she has mentioned seeing snakes, but not regularly. There is a strong family history of dementia in her mother, maternal grandmother, older sister. She does not drink alcohol.  ?  ? ?PREVIOUS MEDICATIONS:  ? ?CURRENT MEDICATIONS:  ?Outpatient Encounter Medications as of 10/13/2021  ?Medication Sig  ? acetaminophen (TYLENOL) 325 MG tablet Take 650 mg by mouth every 6 (six) hours as needed for moderate pain.   ? amLODipine (NORVASC) 2.5 MG tablet Take 2.5 mg by mouth daily.   ? amLODipine (NORVASC) 5 MG tablet amlodipine 5 mg  tablet ? TAKE 1 TABLET BY MOUTH EVERY DAY  ? atovaquone (MEPRON) 750 MG/5ML suspension atovaquone 750 mg/5 mL oral suspension ? TAKE 5 ML ORAL SUSPENSION 2 TIMES A DAY TREAT TOXOPLASMOSIS  ? azithromycin (ZITHROMAX) 250 MG tablet azithromycin 250 mg tablet ? TAKE 2 TABLETS ON 1ST DAY. TAKE 1 TABLET TIMES 5 DAYS TREAT M AND C PNEUMONIAE BACTERIA  ? buPROPion (WELLBUTRIN) 75 MG tablet Take by mouth.  ? chlorhexidine (PERIDEX) 0.12 % solution SMARTSIG:By Mouth  ? clobetasol ointment (TEMOVATE) 0.05 % clobetasol 0.05 % topical ointment ? PLEASE SEE ATTACHED FOR DETAILED DIRECTIONS  ? DERMA-SMOOTHE/FS SCALP 0.01 % OIL SMARTSIG:1 Sparingly Topical Once a Week PRN  ? Diclofenac Sodium 3 % GEL diclofenac 3 % topical gel ? APPLY TO LESION AREAS BY TOPICAL ROUTE 2 TIMES PER DAY  ? docusate sodium (COLACE) 100 MG capsule Take 100 mg by mouth daily.  ? doxycycline (ADOXA) 100 MG tablet doxycycline monohydrate 100 mg tablet  ? DULoxetine (CYMBALTA) 30 MG  capsule duloxetine 30 mg capsule,delayed release ? TAKE ONE CAPSULE BY MOUTH EVERY DAY  ? DULoxetine (CYMBALTA) 60 MG capsule Cymbalta 60 mg capsule,delayed release ? Take 1 capsule every day by oral route.  ? guaiFENesin (MUCINEX) 600 MG 12 hr tablet guaifenesin ER 600 mg tablet, extended release 12 hr ? Take 1 tablet every 12 hours by oral route for 7 days.  ? hydrALAZINE (APRESOLINE) 100 MG tablet Take 100 mg by mouth 3 (three) times daily.  ? hydrOXYzine (ATARAX/VISTARIL) 10 MG tablet Take 10 mg by mouth at bedtime.  ? ketoconazole (NIZORAL) 2 % cream Apply topically 2 (two) times daily as needed.  ? levocetirizine (XYZAL) 5 MG tablet levocetirizine 5 mg tablet ? TAKE 1 TABLET BY MOUTH EVERY DAY  ? levofloxacin (LEVAQUIN) 750 MG tablet Take 750 mg by mouth daily.  ? liothyronine (CYTOMEL) 5 MCG tablet liothyronine 5 mcg tablet ? TAKE 1 TO 2 TABLETS IN THE AM BEFORE FOOD DRINK OR SUPPLEMENTS  ? lisinopril (PRINIVIL,ZESTRIL) 40 MG tablet Take 40 mg by mouth daily.  ? memantine  (NAMENDA) 10 MG tablet memantine 10 mg tablet ? TAKE 1 TABLET BY MOUTH TWICE A DAY  ? metFORMIN (GLUCOPHAGE) 500 MG tablet Take 500 mg by mouth 2 (two) times daily.  ? metFORMIN (GLUCOPHAGE) 500 MG tablet metfor

## 2021-11-15 ENCOUNTER — Telehealth: Payer: Self-pay | Admitting: Physician Assistant

## 2021-11-15 NOTE — Telephone Encounter (Signed)
Patients sister called and stated that Zellie is having more hallucinations.  She wanted to know if anything can be done.

## 2021-11-17 NOTE — Telephone Encounter (Signed)
They are going to contact PCP to advise on recommendations.

## 2021-12-06 ENCOUNTER — Encounter: Payer: Self-pay | Admitting: Podiatry

## 2021-12-06 ENCOUNTER — Ambulatory Visit (INDEPENDENT_AMBULATORY_CARE_PROVIDER_SITE_OTHER): Payer: Medicare Other | Admitting: Podiatry

## 2021-12-06 DIAGNOSIS — M79675 Pain in left toe(s): Secondary | ICD-10-CM | POA: Diagnosis not present

## 2021-12-06 DIAGNOSIS — B351 Tinea unguium: Secondary | ICD-10-CM | POA: Diagnosis not present

## 2021-12-06 DIAGNOSIS — Z7902 Long term (current) use of antithrombotics/antiplatelets: Secondary | ICD-10-CM | POA: Insufficient documentation

## 2021-12-06 DIAGNOSIS — M79674 Pain in right toe(s): Secondary | ICD-10-CM | POA: Diagnosis not present

## 2021-12-06 DIAGNOSIS — E119 Type 2 diabetes mellitus without complications: Secondary | ICD-10-CM

## 2021-12-06 NOTE — Progress Notes (Signed)
  Subjective:  Patient ID: Theresa Mendez, female    DOB: 23-Apr-1946,  MRN: [id number]  Theresa Mendez presents to clinic today for preventative diabetic foot care and painful thick toenails that are difficult to trim. Pain interferes with ambulation. Aggravating factors include wearing enclosed shoe gear. Pain is relieved with periodic professional debridement.  Patient states blood glucose was 102 mg/dl today.  Last A1c was unknown.  She is accompanied by her caregiver on today's visit.  New problem(s): None.   PCP is Annita Brod, MD , and last visit was June, 2023.  Allergies  Allergen Reactions   Plavix [Clopidogrel]     Family stated she had a reaction    Review of Systems: Negative except as noted in the HPI.  Objective: No changes noted in today's physical examination. There were no vitals filed for this visit.  Theresa Mendez is a pleasant 76 y.o. female in NAD. AAO X 3.  Vascular Examination: CFT <3 seconds b/l LE. Palpable DP pulse(s) b/l LE. Palpable PT pulse(s) b/l LE. Pedal hair absent. No pain with calf compression b/l. No edema noted b/l LE. No ischemia or gangrene noted b/l LE. No cyanosis or clubbing noted b/l LE.  Dermatological Examination: Pedal integument with normal turgor, texture and tone BLE. No open wounds b/l LE. No interdigital macerations noted b/l LE. Toenails 1-5 b/l elongated, discolored, dystrophic, thickened, crumbly with subungual debris and tenderness to dorsal palpation. No hyperkeratotic nor porokeratotic lesions present on today's visit.  Neurological Examination: Protective sensation intact 5/5 intact bilaterally with 10g monofilament b/l. Vibratory sensation intact b/l.  Musculoskeletal Examination: Muscle strength 5/5 to all lower extremity muscle groups bilaterally. Palpable exostosis noted midfoot joint of b/l feet. HAV with bunion deformity noted b/l LE. Hammertoe deformity noted 2-5 b/l. Uses rollator for mobility  assistance.  Assessment/Plan: 1. Pain due to onychomycosis of toenails of both feet   2. Controlled type 2 diabetes mellitus without complication, without long-term current use of insulin (HCC)     -Examined patient. -No new findings. No new orders. -Toenails 1-5 b/l were debrided in length and girth with sterile nail nippers and dremel without iatrogenic bleeding.  -Patient/POA to call should there be question/concern in the interim.   Return in about 3 months (around 03/08/2022).  Freddie Breech, DPM

## 2022-01-19 ENCOUNTER — Encounter: Payer: Self-pay | Admitting: Obstetrics and Gynecology

## 2022-01-19 ENCOUNTER — Ambulatory Visit (INDEPENDENT_AMBULATORY_CARE_PROVIDER_SITE_OTHER): Payer: Medicare Other | Admitting: Obstetrics and Gynecology

## 2022-01-19 ENCOUNTER — Other Ambulatory Visit (HOSPITAL_COMMUNITY)
Admission: RE | Admit: 2022-01-19 | Discharge: 2022-01-19 | Disposition: A | Discharge status: Home / Self Care | Payer: Medicare Other | Attending: Obstetrics and Gynecology | Admitting: Obstetrics and Gynecology

## 2022-01-19 VITALS — BP 109/71 | HR 60 | Ht 61.0 in | Wt 164.0 lb

## 2022-01-19 DIAGNOSIS — B9620 Unspecified Escherichia coli [E. coli] as the cause of diseases classified elsewhere: Secondary | ICD-10-CM | POA: Diagnosis not present

## 2022-01-19 DIAGNOSIS — R82998 Other abnormal findings in urine: Secondary | ICD-10-CM

## 2022-01-19 DIAGNOSIS — N393 Stress incontinence (female) (male): Secondary | ICD-10-CM | POA: Diagnosis not present

## 2022-01-19 DIAGNOSIS — N390 Urinary tract infection, site not specified: Secondary | ICD-10-CM | POA: Diagnosis not present

## 2022-01-19 DIAGNOSIS — K5904 Chronic idiopathic constipation: Secondary | ICD-10-CM

## 2022-01-19 DIAGNOSIS — R350 Frequency of micturition: Secondary | ICD-10-CM

## 2022-01-19 LAB — POCT URINALYSIS DIPSTICK
Appearance: ABNORMAL
Bilirubin, UA: NEGATIVE
Glucose, UA: NEGATIVE
Ketones, UA: NEGATIVE
Nitrite, UA: POSITIVE
Protein, UA: POSITIVE — AB
Spec Grav, UA: 1.025
Urobilinogen, UA: 1 U/dL
pH, UA: 7

## 2022-01-19 MED ORDER — SULFAMETHOXAZOLE-TRIMETHOPRIM 800-160 MG PO TABS: 1.0000 | ORAL_TABLET | Freq: Two times a day (BID) | ORAL | 0 refills | Status: AC

## 2022-01-19 NOTE — Progress Notes (Unsigned)
[facility] New Patient Evaluation and Consultation  Referring Provider: Annita Brod, MD PCP: Annita Brod, MD Date of Service: 01/19/2022  SUBJECTIVE Chief Complaint: New Patient (Initial Visit) Bradi Arbuthnot is a 76 y.o. female here for a consul ton incontinence./)  History of Present Illness: Shatera Rennert is a 76 y.o. Black or African-American female seen in consultation at the request of Dr. Darleene Cleaver for evaluation of incontinence.    Her sister, [name], was present for the visit to assist with history.   Urinary Symptoms: Leaks urine with laughing, coughing. Feels that she is able to get to the bathroom when she has an urge.  Leaks 0-1 time(s) per day.  Pad use: 1-2 pads per day. She is slightly bothered by her UI symptoms.  Day time voids- every few hours.  Nocturia: 1 times per night to void. Voiding dysfunction: she empties her bladder well.  does not use a catheter to empty bladder.  When urinating, she feels she has no difficulties  UTIs:  0  UTI's in the last year.   Denies history of blood in urine and kidney or bladder stones  Pelvic Organ Prolapse Symptoms:                  She Denies a feeling of a bulge the vaginal area.  Bowel Symptom: Bowel movements: 1 time(s) per day Stool consistency: hard, has some constipation Straining: yes.  Splinting: no.  Incomplete evacuation: no.  She Denies accidental bowel leakage / fecal incontinence Bowel regimen: none  Sexual Function Sexually active: no.   Pelvic Pain Denies pelvic pain  Past Medical History:  Past Medical History:  Diagnosis Date   Alzheimer disease (HCC)    Dementia (HCC)    Diabetes mellitus without complication (HCC)    Hypertension      Past Surgical History:   Past Surgical History:  Procedure Laterality Date   ABDOMINAL HYSTERECTOMY     COLON SURGERY       Past OB/GYN History: OB History  Gravida Para Term Preterm AB Living  1         1  SAB IAB Ectopic Multiple  Live Births          1    # Outcome Date GA Lbr Len/2nd Weight Sex Delivery Anes PTL Lv  1 Gravida             Vaginal deliveries: 1,  Forceps/ Vacuum deliveries: 0 S/p hysterectomy, denies vaginal bleeding   Medications: She has a current medication list which includes the following prescription(s): acetaminophen, amlodipine, bupropion, clobetasol ointment, duloxetine, ezetimibe, hydralazine, levocetirizine, memantine, metformin, metoprolol tartrate, naproxen sodium, sitagliptin, spironolactone, sulfamethoxazole-trimethoprim, and lisinopril.   Allergies: Patient is allergic to plavix [clopidogrel].   Social History:  Social History   Tobacco Use   Smoking status: Former    Packs/day: 0.50    Years: 8.00    Total pack years: 4.00    Types: Cigarettes   Smokeless tobacco: Never  Vaping Use   Vaping Use: Never used  Substance Use Topics   Alcohol use: No   Drug use: No    Relationship status: widowed She lives with her sister.   She is not employed. Regular exercise: No History of abuse: No  Family History:   Family History  Problem Relation Age of Onset   Dementia Mother    Dementia Sister    Dementia Maternal Grandmother      Review of Systems: Review of Systems  Constitutional:  Negative for fever, malaise/fatigue and weight loss.  Respiratory:  Negative for cough, shortness of breath and wheezing.   Cardiovascular:  Negative for chest pain, palpitations and leg swelling.  Gastrointestinal:  Negative for abdominal pain and blood in stool.  Genitourinary:  Negative for dysuria.  Musculoskeletal:  Positive for myalgias.  Skin:  Negative for rash.  Neurological:  Positive for dizziness. Negative for headaches.  Endo/Heme/Allergies:  Does not bruise/bleed easily.  Psychiatric/Behavioral:  Negative for depression. The patient is not nervous/anxious.      OBJECTIVE Physical Exam: Vitals:   01/19/22 1317  BP: 109/71  Pulse: 60  Weight: 164 lb (74.4 kg)   Height: 5\' 1"  (1.549 m)    Physical Exam Constitutional:      General: She is not in acute distress. Pulmonary:     Effort: Pulmonary effort is normal.  Abdominal:     General: There is no distension.     Palpations: Abdomen is soft.     Tenderness: There is no abdominal tenderness. There is no rebound.  Musculoskeletal:        General: No swelling. Normal range of motion.  Skin:    General: Skin is warm and dry.     Findings: No rash.  Neurological:     Mental Status: She is alert and oriented to person, place, and time.  Psychiatric:        Mood and Affect: Mood normal.        Behavior: Behavior normal.      GU / Detailed Urogynecologic Evaluation:  Pelvic Exam: Normal external female genitalia; Bartholin's and Skene's glands normal in appearance; urethral meatus normal in appearance, no urethral masses or discharge.   CST: negative  s/p hysterectomy: Speculum exam reveals normal vaginal mucosa with  atrophy and normal vaginal cuff.  Adnexa no mass, fullness, tenderness.     Pelvic floor strength I/V  Pelvic floor musculature: Right levator non-tender, Right obturator non-tender, Left levator non-tender, Left obturator non-tender  POP-Q:  - deferred, no prolapse   Rectal Exam:  Normal external rectum  Post-Void Residual (PVR) by Bladder Scan: In order to evaluate bladder emptying, we discussed obtaining a postvoid residual and she agreed to this procedure.  Procedure: The ultrasound unit was placed on the patient's abdomen in the suprapubic region after the patient had voided. A PVR of 104 ml was obtained by bladder scan.  Laboratory Results: POC urine: large luekocytes, positive nitrites, small blood   ASSESSMENT AND PLAN Ms. Thorup is a 76 y.o. with:  1. SUI (stress urinary incontinence, female)   2. Leukocytes in urine   3. Urinary frequency    SUI - For treatment of stress urinary incontinence,  non-surgical options include expectant management,  weight loss, physical therapy, as well as a pessary.  She and her sister are not interested in surgical options. - Referral placed to pelvic PT.  - She would like a pessary and will return for a fitting  2. Leukocytes in urine - concern for UTI today- urine culture sent - Will treat with bactrim DS BID X3 days  3. Constipation - Advised to start daily fiber supplementation to soften stools.  - Can add miralax if she has constipation - Also provided with recipe for power pudding.   Return for pessary   61, MD

## 2022-01-19 NOTE — Patient Instructions (Addendum)
Constipation: Our goal is to achieve formed bowel movements daily or every-other-day.  You may need to try different combinations of the following options to find what works best for you - everybody's body works differently so feel free to adjust the dosages as needed.  Some options to help maintain bowel health include:  Dietary changes (more leafy greens, vegetables and fruits; less processed foods) Fiber supplementation (Benefiber, FiberCon, Metamucil or Psyllium). Start slow and increase gradually to full dose. Over-the-counter agents such as: stool softeners (Docusate or Colace) and/or laxatives (Miralax, milk of magnesia)  "Power Pudding" is a natural mixture that may help your constipation.  To make blend 1 cup applesauce, 1 cup wheat bran, and 3/4 cup prune juice, refrigerate and then take 1 tablespoon daily with a large glass of water as needed.   You have been prescribed bactrim for a urinary tract infection- take two times a day for 3 days.

## 2022-01-20 ENCOUNTER — Encounter: Payer: Self-pay | Admitting: Obstetrics and Gynecology

## 2022-01-22 LAB — URINE CULTURE: Culture: >=100000 — AB

## 2022-02-03 ENCOUNTER — Encounter: Payer: Medicare Other | Attending: Obstetrics and Gynecology | Admitting: Physical Therapy

## 2022-02-03 ENCOUNTER — Encounter: Payer: Self-pay | Admitting: Physical Therapy

## 2022-02-03 ENCOUNTER — Other Ambulatory Visit: Payer: Self-pay

## 2022-02-03 DIAGNOSIS — R279 Unspecified lack of coordination: Secondary | ICD-10-CM | POA: Diagnosis present

## 2022-02-03 DIAGNOSIS — M6281 Muscle weakness (generalized): Secondary | ICD-10-CM | POA: Insufficient documentation

## 2022-02-03 NOTE — Therapy (Signed)
OUTPATIENT PHYSICAL THERAPY FEMALE PELVIC EVALUATION   Patient Name: Theresa Mendez MRN: [id number] DOB:[birth year], 76 y.o., female Today's Date: 02/03/2022   PT End of Session - 02/03/22 1459     Visit Number 1    Date for PT Re-Evaluation 04/28/22    Authorization Type [employer] medicare    PT Start Time 1400    PT Stop Time 1445    PT Time Calculation (min) 45 min    Activity Tolerance Patient tolerated treatment well    Behavior During Therapy WFL for tasks assessed/performed             Past Medical History:  Diagnosis Date   Alzheimer disease (HCC)    Dementia (HCC)    Diabetes mellitus without complication (HCC)    Hypertension    Past Surgical History:  Procedure Laterality Date   ABDOMINAL HYSTERECTOMY     COLON SURGERY     Patient Active Problem List   Diagnosis Date Noted   Antiplatelet or antithrombotic long-term use 12/06/2021   Unsteady gait when walking 08/17/2021   Chronic anxiety 01/26/2021   History of cerebrovascular accident 01/26/2021   Muscle pain 01/26/2021   Palpitations 01/26/2021   Cough 09/03/2020   CAP (community acquired pneumonia) 05/06/2020   Hyperglycemia due to diabetes mellitus (HCC) 05/06/2020   Leukocytosis 05/06/2020   Viral pneumonia 05/06/2020   Type II diabetes mellitus, uncontrolled 04/17/2020   Low back pain 11/26/2019   Uncontrolled type 2 diabetes mellitus with complication, without long-term current use of insulin 11/26/2015   Sleep disorder 03/02/2015   Hyperlipidemia associated with type 2 diabetes mellitus (HCC) 04/24/2014   Hypertension associated with diabetes (HCC) 04/24/2014   Anxiety and depression 03/21/2013   Alzheimer's disease (HCC) 09/18/2012   S/P colonoscopy with polypectomy 10/06/2011   Spinal stenosis of lumbar region 07/29/2010   LVH (left ventricular hypertrophy) 08/05/2009   Vitamin D deficiency 08/05/2009   DJD (degenerative joint disease), cervical 05/20/2009   Stroke (HCC) 05/20/2009   Breast  calcification seen on mammogram 01/26/2009    PCP: Annita Brod, MD  REFERRING PROVIDER: Marguerita Beards, MD  REFERRING DIAG: N39.3 (ICD-10-CM) - SUI (stress urinary incontinence, female)  THERAPY DIAG:  Muscle weakness (generalized)  Unspecified lack of coordination  Rationale for Evaluation and Treatment Rehabilitation  ONSET DATE: 01/2021  SUBJECTIVE:                                                                                                                                                                                           SUBJECTIVE STATEMENT: Patient is at therapy due to her urgency. Patient is pad is dry in  the morning. Patient is embarrassed when she has to rush to the bathroom before she leaks. If she is not able to urinate then she will run the water.  Fluid intake: Yes: drinks more coffee  1 cup and rest is water, sometimes tea     PAIN:  Are you having pain? No   PRECAUTIONS: Other: memory  WEIGHT BEARING RESTRICTIONS No  FALLS:  Has patient fallen in last 6 months? No  LIVING ENVIRONMENT: Lives with: lives with their family, has caregivers  OCCUPATION: retired  PLOF: Requires assistive device for independence  PATIENT GOALS reduce the urgency   PERTINENT HISTORY:  Abdominal hysterectomy; colon surgery; Alzheimer, Demnetia; Diabetes  BOWEL MOVEMENT Pain with bowel movement: No Type of bowel movement:Type (Bristol Stool Scale) Type 4, Frequency 1 time per day, Strain Yes, and Splinting no ; strains sometimes Fully empty rectum: No Leakage: No Fiber supplement: Yes: getting more fiber in the diet  URINATION Pain with urination: No Fully empty bladder: No Stream: Strong Urgency: Yes: laughing,   running water Frequency: urinates every few hours, 1 x night void Leakage: Coughing and Laughing, leaks 0-1 time per day, more like the urgency Pads: Yes: 1-2 pads per day  INTERCOURSE; Not sexually active  PREGNANCY Vaginal  deliveries 1   PROLAPSE None    OBJECTIVE:   DIAGNOSTIC FINDINGS:  Pelvic floor strength I/V; A PVR of 104 ml was obtained by bladder scan.  PATIENT SURVEYS:  None done to her Alzheimer's    COGNITION:  Overall cognitive status: Impaired Needs help to remember due to the dementia and Alzheimers    SENSATION:  Light touch: Appears intact  Proprioception: Appears intact   GAIT: Distance walked: walks slowly Assistive device utilized: Hemi walker Level of assistance: Modified independence                POSTURE: No Significant postural limitations    LOWER EXTREMITY MMT:  MMT Right eval Left eval  Hip flexion 4/5 4/5  Hip extension 4/5 4/5  Hip abduction 3+/5 3+/5  Hip adduction 4/5 4/5  Knee flexion 4/5 4/5  Knee extension 4/5 4/5    PALPATION:   External Perineal Exam no tenderness                             Internal Pelvic Floor tightness in the perineal body and introitus. She will bulge the pelvic floor when she is trying to contract. She needed many tactile cues to correctly contract the pelvic floor with the abdominals. She was able to do it 2 out of 10 times.   Patient confirms identification and approves PT to assess internal pelvic floor and treatment Yes  PELVIC MMT:   MMT eval  Vaginal 1/5  (Blank rows = not tested)        TONE: increased  PROLAPSE: None noted  TODAY'S TREATMENT  EVAL Date: 02/03/2022 HEP established-see below    PATIENT EDUCATION:  Education details: walking program Person educated: Patient and Caregiver sister Education method: Explanation Education comprehension: verbalized understanding by sister and patient   HOME EXERCISE PROGRAM: See above  ASSESSMENT:  CLINICAL IMPRESSION: Patient is a 76 y.o. female who was seen today for physical therapy evaluation and treatment for stress incontinence. Patient sister has helped to fill in the details due to patient having alzheimer's and dementia. Patient has had  urinary leakage for 1 year. She will leak or have the urgency when she laughs  and running water. She will use running water when she sits on the commode and not able to urinate. She uses 1-2  pull ups per day. Patient has caregivers at home that help her and her sister will stay with her at night. She needs to use  walker at home. She needs minimal assistance to stand up from sitting position. Pelvic floor strength is 1/5 and she will contract correctly 2 out of 10x. Instead she will bulge her pelvic floor. She hs tightness in the perineal body and along the introitus. She has weakness in both of her legs. Therapists has talked to patient and her sister about a walking program for 5 minutes per day to help with her mobility. Patient will benefit from skilled therapy to improve pelvic floor strength and coordination so she is not bulging the pelvic floor when she is to contract it.    OBJECTIVE IMPAIRMENTS decreased activity tolerance, decreased cognition, decreased coordination, decreased endurance, decreased strength, and increased fascial restrictions.   ACTIVITY LIMITATIONS continence, toileting, and locomotion level  PARTICIPATION LIMITATIONS: community activity  PERSONAL FACTORS Age, Fitness, and 3+ comorbidities:    Abdominal hysterectomy; colon surgery; Alzheimer, Demnetia; Diabetes are also affecting patient's functional outcome.   REHAB POTENTIAL: Good  CLINICAL DECISION MAKING: Evolving/moderate complexity  EVALUATION COMPLEXITY: Moderate   GOALS: Goals reviewed with patient? Yes  SHORT TERM GOALS: Target date: 03/03/2022  Patient is able to contract the pelvic floor instead of bulging.  Baseline: Goal status: INITIAL   LONG TERM GOALS: Target date: 04/28/2022   Patient is able to perform her HEP with the help of her caregivers and sister.  Baseline:  Goal status: INITIAL  2.  Patient is on a walking program daily for 5-10 minutes to improve her mobility to get to the  bathroom in time.  Baseline:  Goal status: INITIAL  3.  Pelvic floor strength is 3/5 with 5 out of 10 contractions so she is able to reduce her urinary leakage.  Baseline:  Goal status: INITIAL  4.  Patient and her sister are instructed on the urge to void behavioral technique to reduce her urgency  3 out 5 times when she has it.  Baseline:  Goal status: INITIAL  PLAN: PT FREQUENCY: 1x/week  PT DURATION: 12 weeks  PLANNED INTERVENTIONS: Therapeutic exercises, Therapeutic activity, Neuromuscular re-education, Balance training, Gait training, Patient/Family education, Self Care, Dry Needling, Biofeedback, and Manual therapy  PLAN FOR NEXT SESSION: work on the introitus and perineal body for a correct pelvic floor contraction, urge to void, check on walking program   Eulis Foster, PT 02/03/22 3:31 PM

## 2022-02-10 ENCOUNTER — Encounter: Payer: Medicare Other | Admitting: Physical Therapy

## 2022-02-10 ENCOUNTER — Encounter: Payer: Self-pay | Admitting: Physical Therapy

## 2022-02-10 DIAGNOSIS — M6281 Muscle weakness (generalized): Secondary | ICD-10-CM

## 2022-02-10 DIAGNOSIS — R279 Unspecified lack of coordination: Secondary | ICD-10-CM

## 2022-02-10 NOTE — Therapy (Signed)
OUTPATIENT PHYSICAL THERAPY TREATMENT NOTE   Patient Name: Theresa Mendez MRN: [id number] DOB:Nov 21, 1945, 76 y.o., female Today's Date: 02/10/2022  PCP: Annita Brod, MD REFERRING PROVIDER: Marguerita Beards, MD  END OF SESSION:   PT End of Session - 02/10/22 1425     Visit Number 2    Date for PT Re-Evaluation 04/28/22    Authorization Type [employer] medicare    Authorization - Visit Number 1    Authorization - Number of Visits 10    PT Start Time 1400    PT Stop Time 1450    PT Time Calculation (min) 50 min    Activity Tolerance Patient tolerated treatment well    Behavior During Therapy WFL for tasks assessed/performed             Past Medical History:  Diagnosis Date   Alzheimer disease (HCC)    Dementia (HCC)    Diabetes mellitus without complication (HCC)    Hypertension    Past Surgical History:  Procedure Laterality Date   ABDOMINAL HYSTERECTOMY     COLON SURGERY     Patient Active Problem List   Diagnosis Date Noted   Antiplatelet or antithrombotic long-term use 12/06/2021   Unsteady gait when walking 08/17/2021   Chronic anxiety 01/26/2021   History of cerebrovascular accident 01/26/2021   Muscle pain 01/26/2021   Palpitations 01/26/2021   Cough 09/03/2020   CAP (community acquired pneumonia) 05/06/2020   Hyperglycemia due to diabetes mellitus (HCC) 05/06/2020   Leukocytosis 05/06/2020   Viral pneumonia 05/06/2020   Type II diabetes mellitus, uncontrolled 04/17/2020   Low back pain 11/26/2019   Uncontrolled type 2 diabetes mellitus with complication, without long-term current use of insulin 11/26/2015   Sleep disorder 03/02/2015   Hyperlipidemia associated with type 2 diabetes mellitus (HCC) 04/24/2014   Hypertension associated with diabetes (HCC) 04/24/2014   Anxiety and depression 03/21/2013   Alzheimer's disease (HCC) 09/18/2012   S/P colonoscopy with polypectomy 10/06/2011   Spinal stenosis of lumbar region 07/29/2010   LVH (left  ventricular hypertrophy) 08/05/2009   Vitamin D deficiency 08/05/2009   DJD (degenerative joint disease), cervical 05/20/2009   Stroke (HCC) 05/20/2009   Breast calcification seen on mammogram 01/26/2009   REFERRING DIAG: N39.3 (ICD-10-CM) - SUI (stress urinary incontinence, female)   THERAPY DIAG:  Muscle weakness (generalized)   Unspecified lack of coordination   Rationale for Evaluation and Treatment Rehabilitation   ONSET DATE: 01/2021   SUBJECTIVE:  SUBJECTIVE STATEMENT: Patient is at therapy due to her urgency. Patient is pad is dry in the morning. Patient is embarrassed when she has to rush to the bathroom before she leaks. If she is not able to urinate then she will run the water.  Fluid intake: Yes: drinks more coffee  1 cup and rest is water, sometimes tea       PAIN:  Are you having pain? No     PRECAUTIONS: Other: memory   WEIGHT BEARING RESTRICTIONS No   FALLS:  Has patient fallen in last 6 months? No   LIVING ENVIRONMENT: Lives with: lives with their family, has caregivers   OCCUPATION: retired   PLOF: Requires assistive device for independence   PATIENT GOALS reduce the urgency    PERTINENT HISTORY:  Abdominal hysterectomy; colon surgery; Alzheimer, Demnetia; Diabetes   BOWEL MOVEMENT Pain with bowel movement: No Type of bowel movement:Type (Bristol Stool Scale) Type 4, Frequency 1 time per day, Strain Yes, and Splinting no ; strains sometimes Fully empty rectum: No Leakage: No Fiber supplement: Yes: getting more fiber in the diet   URINATION Pain with urination: No Fully empty bladder: No Stream: Strong Urgency: Yes: laughing,   running water Frequency: urinates every few hours, 1 x night void Leakage: Coughing and Laughing, leaks 0-1 time per day, more like  the urgency Pads: Yes: 1-2 pads per day   INTERCOURSE; Not sexually active   PREGNANCY Vaginal deliveries 1     PROLAPSE None       OBJECTIVE:    DIAGNOSTIC FINDINGS:  Pelvic floor strength I/V; A PVR of 104 ml was obtained by bladder scan.   PATIENT SURVEYS:  None done to her Alzheimer's       COGNITION:            Overall cognitive status: Impaired      Needs help to remember due to the dementia and Alzheimers                   SENSATION:            Light touch: Appears intact            Proprioception: Appears intact     GAIT: Distance walked: walks slowly Assistive device utilized: Hemi walker Level of assistance: Modified independence                  POSTURE: No Significant postural limitations                LOWER EXTREMITY MMT:   MMT Right eval Left eval  Hip flexion 4/5 4/5  Hip extension 4/5 4/5  Hip abduction 3+/5 3+/5  Hip adduction 4/5 4/5  Knee flexion 4/5 4/5  Knee extension 4/5 4/5     PALPATION:   External Perineal Exam no tenderness                             Internal Pelvic Floor tightness in the perineal body and introitus. She will bulge the pelvic floor when she is trying to contract. She needed many tactile cues to correctly contract the pelvic floor with the abdominals. She was able to do it 2 out of 10 times.    Patient confirms identification and approves PT to assess internal pelvic floor and treatment Yes   PELVIC MMT:   MMT eval  Vaginal 1/5  (Blank rows = not tested)  TONE: increased   PROLAPSE: None noted   TODAY'S TREATMENT  02/10/2022 Exercises: Stretches/mobility: Strengthening:sitting ball squeeze hold 5 sec 10x with focus on pelvic floor forall exercises   Sitting marching with red band 20x   Hip abduction with red band 20x   Knee extension 20x with red band   Sit to stand Therapeutic exercise:  Educated patient on how to go from sit to stand  correctly, how to push up with her hand and going  forward, not pulling on the walker.  GAit:  Walk to PT room to waiting room for 106 feet. X2     EVAL Date: 02/03/2022 HEP established-see below     PATIENT EDUCATION: 02/10/2022 Education details: Access Code: [email or web address] Person educated: Patient Education method: Explanation, Demonstration, Tactile cues, Verbal cues, and Handouts Education comprehension: verbalized understanding, returned demonstration, verbal cues required, tactile cues required, and needs further education    HOME EXERCISE PROGRAM: 02/10/2922 Access Code: [email or web address] URL: [email or web address] Date: 02/10/2022 Prepared by: Eulis Foster  Exercises - Seated Hip Adduction Isometrics with Newman Pies  - 1 x daily - 7 x weekly - 2 sets - 10 reps - Seated Hip Abduction with Resistance  - 1 x daily - 7 x weekly - 2 sets - 10 reps - Seated March with Resistance  - 1 x daily - 7 x weekly - 2 sets - 10 reps - Seated Knee Extension with Resistance  - 1 x daily - 7 x weekly - 3 sets - 10 reps - Sit to Stand with Pelvic Floor Contraction  - 1 x daily - 7 x weekly - 1 sets - 5 reps  ASSESSMENT:   CLINICAL IMPRESSION: Patient is a 76 y.o. female who was seen today for physical therapy treatment for stress incontinence. Patient needs lots verbal cues to go from sit to stand and use the walker. She reports she has not leaked since last visit but she does have memory issues. Patient is learning her exercises and there is a video she can follow.  Patient will benefit from skilled therapy to improve pelvic floor strength and coordination so she is not bulging the pelvic floor when she is to contract it.      OBJECTIVE IMPAIRMENTS decreased activity tolerance, decreased cognition, decreased coordination, decreased endurance, decreased strength, and increased fascial restrictions.    ACTIVITY LIMITATIONS continence, toileting, and locomotion level   PARTICIPATION LIMITATIONS: community activity   PERSONAL FACTORS Age, Fitness,  and 3+ comorbidities:    Abdominal hysterectomy; colon surgery; Alzheimer, Demnetia; Diabetes are also affecting patient's functional outcome.    REHAB POTENTIAL: Good   CLINICAL DECISION MAKING: Evolving/moderate complexity   EVALUATION COMPLEXITY: Moderate     GOALS: Goals reviewed with patient? Yes   SHORT TERM GOALS: Target date: 03/03/2022   Patient is able to contract the pelvic floor instead of bulging.  Baseline: Goal status: INITIAL     LONG TERM GOALS: Target date: 04/28/2022    Patient is able to perform her HEP with the help of her caregivers and sister.  Baseline:  Goal status: INITIAL   2.  Patient is on a walking program daily for 5-10 minutes to improve her mobility to get to the bathroom in time.  Baseline:  Goal status: INITIAL   3.  Pelvic floor strength is 3/5 with 5 out of 10 contractions so she is able to reduce her urinary leakage.  Baseline:  Goal status: INITIAL   4.  Patient and her sister are instructed on the  urge to void behavioral technique to reduce her urgency  3 out 5 times when she has it.  Baseline:  Goal status: INITIAL   PLAN: PT FREQUENCY: 1x/week   PT DURATION: 12 weeks   PLANNED INTERVENTIONS: Therapeutic exercises, Therapeutic activity, Neuromuscular re-education, Balance training, Gait training, Patient/Family education, Self Care, Dry Needling, Biofeedback, and Manual therapy   PLAN FOR NEXT SESSION: work on the introitus and perineal body for a correct pelvic floor contraction, urge to void, check on walking program   Eulis Foster, PT 02/10/22 5:03 PM

## 2022-02-24 ENCOUNTER — Encounter: Payer: Medicare Other | Admitting: Physical Therapy

## 2022-03-03 ENCOUNTER — Encounter: Payer: Medicare Other | Attending: Obstetrics and Gynecology | Admitting: Physical Therapy

## 2022-03-03 ENCOUNTER — Encounter: Payer: Self-pay | Admitting: Physical Therapy

## 2022-03-03 DIAGNOSIS — G309 Alzheimer's disease, unspecified: Secondary | ICD-10-CM | POA: Diagnosis not present

## 2022-03-03 DIAGNOSIS — M6281 Muscle weakness (generalized): Secondary | ICD-10-CM | POA: Insufficient documentation

## 2022-03-03 DIAGNOSIS — F0280 Dementia in other diseases classified elsewhere without behavioral disturbance: Secondary | ICD-10-CM | POA: Diagnosis not present

## 2022-03-03 DIAGNOSIS — R279 Unspecified lack of coordination: Secondary | ICD-10-CM | POA: Diagnosis not present

## 2022-03-03 DIAGNOSIS — N393 Stress incontinence (female) (male): Secondary | ICD-10-CM | POA: Insufficient documentation

## 2022-03-03 NOTE — Therapy (Signed)
OUTPATIENT PHYSICAL THERAPY TREATMENT NOTE   Patient Name: Theresa Mendez MRN: [id number] DOB:03/08/1946, 76 y.o., female Today's Date: 03/03/2022  PCP: Annita BrodAsenso, Philip, MD REFERRING PROVIDER: Marguerita BeardsSchroeder, Michelle N, MD  END OF SESSION:   PT End of Session - 03/03/22 1409     Visit Number 3    Date for PT Re-Evaluation 04/28/22    Authorization Type [employer] medicare    Authorization - Visit Number 3    Authorization - Number of Visits 10    PT Start Time 1400    PT Stop Time 1450    PT Time Calculation (min) 50 min    Activity Tolerance Patient tolerated treatment well    Behavior During Therapy WFL for tasks assessed/performed             Past Medical History:  Diagnosis Date   Alzheimer disease (HCC)    Dementia (HCC)    Diabetes mellitus without complication (HCC)    Hypertension    Past Surgical History:  Procedure Laterality Date   ABDOMINAL HYSTERECTOMY     COLON SURGERY     Patient Active Problem List   Diagnosis Date Noted   Antiplatelet or antithrombotic long-term use 12/06/2021   Unsteady gait when walking 08/17/2021   Chronic anxiety 01/26/2021   History of cerebrovascular accident 01/26/2021   Muscle pain 01/26/2021   Palpitations 01/26/2021   Cough 09/03/2020   CAP (community acquired pneumonia) 05/06/2020   Hyperglycemia due to diabetes mellitus (HCC) 05/06/2020   Leukocytosis 05/06/2020   Viral pneumonia 05/06/2020   Type II diabetes mellitus, uncontrolled 04/17/2020   Low back pain 11/26/2019   Uncontrolled type 2 diabetes mellitus with complication, without long-term current use of insulin 11/26/2015   Sleep disorder 03/02/2015   Hyperlipidemia associated with type 2 diabetes mellitus (HCC) 04/24/2014   Hypertension associated with diabetes (HCC) 04/24/2014   Anxiety and depression 03/21/2013   Alzheimer's disease (HCC) 09/18/2012   S/P colonoscopy with polypectomy 10/06/2011   Spinal stenosis of lumbar region 07/29/2010   LVH (left  ventricular hypertrophy) 08/05/2009   Vitamin D deficiency 08/05/2009   DJD (degenerative joint disease), cervical 05/20/2009   Stroke (HCC) 05/20/2009   Breast calcification seen on mammogram 01/26/2009   REFERRING DIAG: N39.3 (ICD-10-CM) - SUI (stress urinary incontinence, female)   THERAPY DIAG:  Muscle weakness (generalized)   Unspecified lack of coordination   Rationale for Evaluation and Treatment Rehabilitation   ONSET DATE: 01/2021   SUBJECTIVE:  SUBJECTIVE STATEMENT: Patient pull up was wet one day over night.     PAIN:  Are you having pain? No     PRECAUTIONS: Other: memory   WEIGHT BEARING RESTRICTIONS No   FALLS:  Has patient fallen in last 6 months? No   LIVING ENVIRONMENT: Lives with: lives with their family, has caregivers   OCCUPATION: retired   PLOF: Requires assistive device for independence   PATIENT GOALS reduce the urgency    PERTINENT HISTORY:  Abdominal hysterectomy; colon surgery; Alzheimer, Demnetia; Diabetes   BOWEL MOVEMENT Pain with bowel movement: No Type of bowel movement:Type (Bristol Stool Scale) Type 4, Frequency 1 time per day, Strain Yes, and Splinting no ; strains sometimes Fully empty rectum: No Leakage: No Fiber supplement: Yes: getting more fiber in the diet   URINATION Pain with urination: No Fully empty bladder: No Stream: Strong Urgency: Yes: laughing,   running water Frequency: urinates every few hours, 1 x night void Leakage: Coughing and Laughing, leaks 0-1 time per day, more like the urgency Pads: Yes: 1-2 pads per day   INTERCOURSE; Not sexually active   PREGNANCY Vaginal deliveries 1     PROLAPSE None       OBJECTIVE:    DIAGNOSTIC FINDINGS:  Pelvic floor strength I/V; A PVR of 104 ml was obtained by bladder  scan.   PATIENT SURVEYS:  None done to her Alzheimer's       COGNITION:            Overall cognitive status: Impaired      Needs help to remember due to the dementia and Alzheimers                   SENSATION:            Light touch: Appears intact            Proprioception: Appears intact     GAIT: Distance walked: walks slowly Assistive device utilized: Hemi walker Level of assistance: Modified independence                  POSTURE: No Significant postural limitations                LOWER EXTREMITY MMT:   MMT Right eval Left eval  Hip flexion 4/5 4/5  Hip extension 4/5 4/5  Hip abduction 3+/5 3+/5  Hip adduction 4/5 4/5  Knee flexion 4/5 4/5  Knee extension 4/5 4/5     PALPATION:   External Perineal Exam no tenderness                             Internal Pelvic Floor tightness in the perineal body and introitus. She will bulge the pelvic floor when she is trying to contract. She needed many tactile cues to correctly contract the pelvic floor with the abdominals. She was able to do it 2 out of 10 times.    Patient confirms identification and approves PT to assess internal pelvic floor and treatment Yes   PELVIC MMT:   MMT eval  Vaginal 1/5  (Blank rows = not tested)         TONE: increased   PROLAPSE: None noted   TODAY'S TREATMENT  03/03/2022 Gait:  Walk with single point cane 118 feet with stopping 4 times due to feeling like she is going to fall. ; walk 10 feet 4 times with single point cane and C.G with verbal  cues to keep upright, not keep cane to far ahead Made up a walking program with family member to give to caregiver to walk 1-2 minutes 3 tiems per day Exercises: Stretches/mobility: Strengthening:sitting ball squeeze hold 5 sec 10x with focus on pelvic floor forall exercises                         Sitting marching with red band 10 x 3                         Hip abduction with red band 10x 3                         Knee extension 10x 3 with  red band                         Sit to stand 10 x    02/10/2022 Exercises: Stretches/mobility: Strengthening:sitting ball squeeze hold 5 sec 10x with focus on pelvic floor forall exercises                         Sitting marching with red band 20x                         Hip abduction with red band 20x                         Knee extension 20x with red band                         Sit to stand Therapeutic exercise:  Educated patient on how to go from sit to stand  correctly, how to push up with her hand and going forward, not pulling on the walker.  GAit:  Walk to PT room to waiting room for 106 feet. X2      EVAL Date: 02/03/2022 HEP established-see below     PATIENT EDUCATION: 02/10/2022 Education details: Access Code: [email or web address] Person educated: Patient Education method: Explanation, Demonstration, Tactile cues, Verbal cues, and Handouts Education comprehension: verbalized understanding, returned demonstration, verbal cues required, tactile cues required, and needs further education     HOME EXERCISE PROGRAM: 02/10/2922 Access Code: [email or web address] URL: [email or web address] Date: 02/10/2022 Prepared by: Earlie Counts   Exercises - Seated Hip Adduction Isometrics with Diona Foley  - 1 x daily - 7 x weekly - 2 sets - 10 reps - Seated Hip Abduction with Resistance  - 1 x daily - 7 x weekly - 2 sets - 10 reps - Seated March with Resistance  - 1 x daily - 7 x weekly - 2 sets - 10 reps - Seated Knee Extension with Resistance  - 1 x daily - 7 x weekly - 3 sets - 10 reps - Sit to Stand with Pelvic Floor Contraction  - 1 x daily - 7 x weekly - 1 sets - 5 reps   ASSESSMENT:   CLINICAL IMPRESSION: Patient is a 75 y.o. female who was seen today for physical therapy treatment for stress incontinence.Patient and caregiver reports patient pullup was wet one night since her last visit. She is doing better with the urgency. Patient is able to feel she has to go to the bathroom. Patient does  better walking with her walker than with the cane.  Her legs will feel they are giving way at times with walking with the cane. She needs C>G with cane and supervision with walker. A walking program was established  today for her to walk at home 3 times per day for 1-2 minutes.  Patient will benefit from skilled therapy to improve pelvic floor strength and coordination so she is not bulging the pelvic floor when she is to contract it.      OBJECTIVE IMPAIRMENTS decreased activity tolerance, decreased cognition, decreased coordination, decreased endurance, decreased strength, and increased fascial restrictions.    ACTIVITY LIMITATIONS continence, toileting, and locomotion level   PARTICIPATION LIMITATIONS: community activity   PERSONAL FACTORS Age, Fitness, and 3+ comorbidities:    Abdominal hysterectomy; colon surgery; Alzheimer, Demnetia; Diabetes are also affecting patient's functional outcome.    REHAB POTENTIAL: Good   CLINICAL DECISION MAKING: Evolving/moderate complexity   EVALUATION COMPLEXITY: Moderate     GOALS: Goals reviewed with patient? Yes   SHORT TERM GOALS: Target date: 03/03/2022   Patient is able to contract the pelvic floor instead of bulging.  Baseline: Goal status: INITIAL     LONG TERM GOALS: Target date: 04/28/2022    Patient is able to perform her HEP with the help of her caregivers and sister.  Baseline:  Goal status: INITIAL   2.  Patient is on a walking program daily for 5-10 minutes to improve her mobility to get to the bathroom in time.  Baseline:  Goal status: INITIAL   3.  Pelvic floor strength is 3/5 with 5 out of 10 contractions so she is able to reduce her urinary leakage.  Baseline:  Goal status: INITIAL   4.  Patient and her sister are instructed on the urge to void behavioral technique to reduce her urgency  3 out 5 times when she has it.  Baseline:  Goal status: INITIAL   PLAN: PT FREQUENCY: 1x/week   PT DURATION: 12 weeks    PLANNED INTERVENTIONS: Therapeutic exercises, Therapeutic activity, Neuromuscular re-education, Balance training, Gait training, Patient/Family education, Self Care, Dry Needling, Biofeedback, and Manual therapy   PLAN FOR NEXT SESSION: work on the introitus and perineal body for a correct pelvic floor contraction, urge to void  Eulis Foster, PT 03/03/22 3:22 PM

## 2022-03-08 ENCOUNTER — Ambulatory Visit (INDEPENDENT_AMBULATORY_CARE_PROVIDER_SITE_OTHER): Payer: Medicare Other | Admitting: Obstetrics and Gynecology

## 2022-03-08 ENCOUNTER — Encounter: Payer: Self-pay | Admitting: Obstetrics and Gynecology

## 2022-03-08 ENCOUNTER — Encounter: Payer: Self-pay | Admitting: Podiatry

## 2022-03-08 ENCOUNTER — Ambulatory Visit: Payer: Medicare Other | Admitting: Podiatry

## 2022-03-08 VITALS — BP 149/75 | HR 70

## 2022-03-08 DIAGNOSIS — M79674 Pain in right toe(s): Secondary | ICD-10-CM

## 2022-03-08 DIAGNOSIS — E119 Type 2 diabetes mellitus without complications: Secondary | ICD-10-CM | POA: Diagnosis not present

## 2022-03-08 DIAGNOSIS — M79675 Pain in left toe(s): Secondary | ICD-10-CM | POA: Diagnosis not present

## 2022-03-08 DIAGNOSIS — N393 Stress incontinence (female) (male): Secondary | ICD-10-CM

## 2022-03-08 DIAGNOSIS — B351 Tinea unguium: Secondary | ICD-10-CM

## 2022-03-08 NOTE — Progress Notes (Signed)
  Subjective:  Patient ID: Theresa Mendez, female    DOB: 1946/03/02,  MRN: [id number]  Theresa Mendez presents to clinic today for  Chief Complaint  Patient presents with   Nail Problem    Diabetic foot care BS-Did not check A1C-Below 5 PCP-[facility] [facility] [address] PCP VST-12/2021   New problem(s): None.   PCP is Clovia Cuff, MD , and last visit was July, 2023.  Review of Systems: Negative except as noted in the HPI.  Objective: No changes noted in today's physical examination.  Theresa Mendez is a pleasant 76 y.o. female in NAD. AAO x 3.  Vascular Examination: CFT <3 seconds b/l LE. Palpable DP pulse(s) b/l LE. Palpable PT pulse(s) b/l LE. Pedal hair absent. No pain with calf compression b/l. No edema noted b/l LE. No ischemia or gangrene noted b/l LE. No cyanosis or clubbing noted b/l LE.  Dermatological Examination: Pedal integument with normal turgor, texture and tone BLE. No open wounds b/l LE. No interdigital macerations noted b/l LE. Toenails 1-5 b/l elongated, discolored, dystrophic, thickened, crumbly with subungual debris and tenderness to dorsal palpation. No hyperkeratotic nor porokeratotic lesions present on today's visit.  Neurological Examination: Protective sensation intact 5/5 intact bilaterally with 10g monofilament b/l. Vibratory sensation intact b/l.  Musculoskeletal Examination: Muscle strength 5/5 to all lower extremity muscle groups bilaterally. Palpable exostosis noted midfoot joint of b/l feet. HAV with bunion deformity noted b/l LE. Hammertoe deformity noted 2-5 b/l. Patient in transport chair.  Assessment/Plan: 1. Pain due to onychomycosis of toenails of both feet   2. Controlled type 2 diabetes mellitus without complication, without long-term current use of insulin (HCC)      -Patient's family member present. All questions/concerns addressed on today's visit. -Examined patient. -Toenails 1-5 b/l were debrided in length and girth with sterile nail  nippers and dremel without iatrogenic bleeding.  -Patient/POA to call should there be question/concern in the interim.   Return in about 3 months (around 06/07/2022).  Marzetta Board, DPM

## 2022-03-08 NOTE — Progress Notes (Signed)
Theresa Mendez   Subjective:     Chief Complaint: Pessary fitting Theresa Mendez is a 76 y.o. female here for a pessary fitting.)  History of Present Illness: Theresa Mendez is a 76 y.o. female with stress incontinence who presents today for a pessary fitting.  She has started pelvic PT for her leakage.   Past Medical History: Patient  has a past medical history of Alzheimer disease ([place]), Dementia ([place]), Diabetes mellitus without complication ([place]), and Hypertension.   Past Surgical History: She  has a past surgical history that includes Abdominal hysterectomy and Colon surgery.   Medications: She has a current medication list which includes the following prescription(s): acetaminophen, amlodipine, bupropion, clobetasol ointment, duloxetine, ezetimibe, hydralazine, levocetirizine, lisinopril, memantine, metformin, metoprolol tartrate, naproxen sodium, sitagliptin, and spironolactone.   Allergies: Patient is allergic to plavix [clopidogrel].   Social History: Patient  reports that she has quit smoking. Her smoking use included cigarettes. She has a 4.00 pack-year smoking history. She has never used smokeless tobacco. She reports that she does not drink alcohol and does not use drugs.      Objective:    BP (!) 149/75   Pulse 70  Gen: No apparent distress, A&O x 3. Pelvic Exam: Normal external female genitalia; Bartholin's and Skene's glands normal in appearance; urethral meatus normal in appearance, no urethral masses or discharge.   A size 0 incontinence ring with support pessary was fitted. It was comfortable, stayed in place with valsalva, standing and bending and was an appropriate size on examination, with one finger fitting between the pessary and the vaginal walls.   Assessment/Plan:    Assessment: Theresa Mendez is a 76 y.o. with stress incontinence who presents for a pessary fitting.  Plan: She was fitted with a #0 incontinence ring with support pessary. She  will keep the pessary in place until next visit.   Follow-up in 4 weeks for a pessary check or sooner as needed. If she has seen improvement then will do 3 month pessary checks.    Theresa Folds, MD

## 2022-03-10 ENCOUNTER — Encounter: Payer: Medicare Other | Admitting: Physical Therapy

## 2022-03-10 ENCOUNTER — Encounter: Payer: Self-pay | Admitting: Physical Therapy

## 2022-03-10 DIAGNOSIS — N393 Stress incontinence (female) (male): Secondary | ICD-10-CM | POA: Diagnosis not present

## 2022-03-10 DIAGNOSIS — M6281 Muscle weakness (generalized): Secondary | ICD-10-CM

## 2022-03-10 DIAGNOSIS — R279 Unspecified lack of coordination: Secondary | ICD-10-CM

## 2022-03-10 NOTE — Therapy (Signed)
OUTPATIENT PHYSICAL THERAPY TREATMENT NOTE   Patient Name: Theresa Mendez MRN: [id number] DOB:December 27, 1945, 76 y.o., female Today's Date: 03/10/2022  PCP: Clovia Cuff, MD REFERRING PROVIDER: Jaquita Folds, MD  END OF SESSION:   PT End of Session - 03/10/22 1611     Visit Number 4    Date for PT Re-Evaluation 04/28/22    Authorization Type [place] - Visit Number 4    Authorization - Number of Visits 10    PT Start Time 1600    PT Stop Time 2550    PT Time Calculation (min) 45 min    Activity Tolerance Patient tolerated treatment well    Behavior During Therapy WFL for tasks assessed/performed             Past Medical History:  Diagnosis Date   Alzheimer disease ([place])    Dementia ([place])    Diabetes mellitus without complication ([place])    Hypertension    Past Surgical History:  Procedure Laterality Date   ABDOMINAL HYSTERECTOMY     COLON SURGERY     Patient Active Problem List   Diagnosis Date Noted   Antiplatelet or antithrombotic long-term use 12/06/2021   Unsteady gait when walking 08/17/2021   Chronic anxiety 01/26/2021   History of cerebrovascular accident 01/26/2021   Muscle pain 01/26/2021   Palpitations 01/26/2021   Cough 09/03/2020   CAP (community acquired pneumonia) 05/06/2020   Hyperglycemia due to diabetes mellitus ([place]) 05/06/2020   Leukocytosis 05/06/2020   Viral pneumonia 05/06/2020   Type II diabetes mellitus, uncontrolled 04/17/2020   Low back pain 11/26/2019   Uncontrolled type 2 diabetes mellitus with complication, without long-term current use of insulin 11/26/2015   Sleep disorder 03/02/2015   Hyperlipidemia associated with type 2 diabetes mellitus ([place]) 04/24/2014   Hypertension associated with diabetes ([place]) 04/24/2014   Anxiety and depression 03/21/2013   Alzheimer's disease ([place]) 09/18/2012   S/P colonoscopy with polypectomy 10/06/2011   Spinal stenosis of lumbar region 07/29/2010   LVH (left  ventricular hypertrophy) 08/05/2009   Vitamin D deficiency 08/05/2009   DJD (degenerative joint disease), cervical 05/20/2009   Stroke ([place]) 05/20/2009   Breast calcification seen on mammogram 01/26/2009   REFERRING DIAG: N39.3 (ICD-10-CM) - SUI (stress urinary incontinence, female)   THERAPY DIAG:  Muscle weakness (generalized)   Unspecified lack of coordination   Rationale for Evaluation and Treatment Rehabilitation   ONSET DATE: 01/2021   SUBJECTIVE:  SUBJECTIVE STATEMENT: Patient pull up was wet one day over night.     PAIN:  Are you having pain? No     PRECAUTIONS: Other: memory   WEIGHT BEARING RESTRICTIONS No   FALLS:  Has patient fallen in last 6 months? No   LIVING ENVIRONMENT: Lives with: lives with their family, has caregivers   OCCUPATION: retired   PLOF: Requires assistive device for independence   PATIENT GOALS reduce the urgency    PERTINENT HISTORY:  Abdominal hysterectomy; colon surgery; Alzheimer, Demnetia; Diabetes   BOWEL MOVEMENT Pain with bowel movement: No Type of bowel movement:Type (Bristol Stool Scale) Type 4, Frequency 1 time per day, Strain Yes, and Splinting no ; strains sometimes Fully empty rectum: No Leakage: No Fiber supplement: Yes: getting more fiber in the diet   URINATION Pain with urination: No Fully empty bladder: No Stream: Strong Urgency: Yes: laughing,   running water Frequency: urinates every few hours, 1 x night void Leakage: Coughing and Laughing, leaks 0-1 time per day, more like the urgency Pads: Yes: 1-2 pads per day   INTERCOURSE; Not sexually active   PREGNANCY Vaginal deliveries 1     PROLAPSE None       OBJECTIVE:    DIAGNOSTIC FINDINGS:  Pelvic floor strength I/V; A PVR of 104 ml was obtained by bladder  scan.   PATIENT SURVEYS:  None done to her Alzheimer's       COGNITION:            Overall cognitive status: Impaired      Needs help to remember due to the dementia and Alzheimers                   SENSATION:            Light touch: Appears intact            Proprioception: Appears intact     GAIT: Distance walked: walks slowly Assistive device utilized: Hemi walker Level of assistance: Modified independence                  POSTURE: No Significant postural limitations                LOWER EXTREMITY MMT:   MMT Right eval Left eval  Hip flexion 4/5 4/5  Hip extension 4/5 4/5  Hip abduction 3+/5 3+/5  Hip adduction 4/5 4/5  Knee flexion 4/5 4/5  Knee extension 4/5 4/5     PALPATION:   External Perineal Exam no tenderness                             Internal Pelvic Floor tightness in the perineal body and introitus. She will bulge the pelvic floor when she is trying to contract. She needed many tactile cues to correctly contract the pelvic floor with the abdominals. She was able to do it 2 out of 10 times.    Patient confirms identification and approves PT to assess internal pelvic floor and treatment Yes   PELVIC MMT:   MMT eval 03/10/2022  Vaginal 1/5 1-2/5   (Blank rows = not tested)         TONE: increased   PROLAPSE: None noted   TODAY'S TREATMENT  03/10/2022 Manual: Internal pelvic floor techniques:No emotional/communication barriers or cognitive limitation. Patient is motivated to learn. Patient understands and agrees with treatment goals and plan. PT explains patient will be examined in standing, sitting,  and lying down to see how their muscles and joints work. When they are ready, they will be asked to remove their underwear so PT can examine their perineum. The patient is also given the option of providing their own chaperone as one is not provided in our facility. The patient also has the right and is explained the right to defer or refuse any part of  the evaluation or treatment including the internal exam. With the patient's consent, PT will use one gloved finger to gently assess the muscles of the pelvic floor, seeing how well it contracts and relaxes and if there is muscle symmetry. After, the patient will get dressed and PT and patient will discuss exam findings and plan of care. PT and patient discuss plan of care, schedule, attendance policy and HEP activities.  Therapist finger in the vaginal canal with tactile cues to contract the pelvic floor instead of bearing down. She mostly bears down, Patient is not able to correctly contract the pelvic floor without many tactile cues and verbal cues in supine.  Therapist had patient contract pelvic floor with hip adduction and she was able to contract the pelvic floor to strength 2/5.  Therapist had finge in the vaginal canal and do a bridge with pelvic floor contraction and did not bear down Dry needling: Exercises: Strengthening:Sitting marching with red band 10 x 3                         Hip abduction with red band 10x 3                         Knee extension 10x 3 with red band                         Sit to stand 10 x   Gait;  walk 105 feet x2 with cane and close C.G and verbal cues to stand up tall and rest 4 times 03/03/2022 Gait:  Walk with single point cane 118 feet with stopping 4 times due to feeling like she is going to fall. ; walk 10 feet 4 times with single point cane and C.G with verbal cues to keep upright, not keep cane to far ahead Made up a walking program with family member to give to caregiver to walk 1-2 minutes 3 tiems per day Exercises: Stretches/mobility: Strengthening:sitting ball squeeze hold 5 sec 10x with focus on pelvic floor forall exercises                         Sitting marching with red band 10 x 3                         Hip abduction with red band 10x 3                         Knee extension 10x 3 with red band                         Sit to stand 10 x     02/10/2022 Exercises: Stretches/mobility: Strengthening:sitting ball squeeze hold 5 sec 10x with focus on pelvic floor forall exercises  Sitting marching with red band 20x                         Hip abduction with red band 20x                         Knee extension 20x with red band                         Sit to stand Therapeutic exercise:  Educated patient on how to go from sit to stand  correctly, how to push up with her hand and going forward, not pulling on the walker.  GAit:  Walk to PT room to waiting room for 106 feet. X2      EVAL Date: 02/03/2022 HEP established-see below     PATIENT EDUCATION: 02/10/2022 Education details: Access Code: [email or web address] Person educated: Patient Education method: Explanation, Demonstration, Tactile cues, Verbal cues, and Handouts Education comprehension: verbalized understanding, returned demonstration, verbal cues required, tactile cues required, and needs further education     HOME EXERCISE PROGRAM: 02/10/2922 Access Code: [email or web address] URL: [email or web address] Date: 02/10/2022 Prepared by: Earlie Counts   Exercises - Seated Hip Adduction Isometrics with Diona Foley  - 1 x daily - 7 x weekly - 2 sets - 10 reps - Seated Hip Abduction with Resistance  - 1 x daily - 7 x weekly - 2 sets - 10 reps - Seated March with Resistance  - 1 x daily - 7 x weekly - 2 sets - 10 reps - Seated Knee Extension with Resistance  - 1 x daily - 7 x weekly - 3 sets - 10 reps - Sit to Stand with Pelvic Floor Contraction  - 1 x daily - 7 x weekly - 1 sets - 5 reps   ASSESSMENT:   CLINICAL IMPRESSION: Patient is a 76 y.o. female who was seen today for physical therapy treatment for stress incontinence.Patient and caregiver reports patient pullup was wet one night since her last visit. Patient caregiver was present during the treatment. She reports the patient still leaks urine and has trouble remembering if she has to go to the bathroom.  Patient is now wearing a pessary to help with the leakage. Pelvic floor strength is 2/5 when she does a bridge or hip adduction with the contraction. She will bear down when asked to contract the pelvic floor with tactile cues and verbal cues. Patient is using the cane with verbal cues to stand upright. She has to take several rests. Today patient had more trouble remembering than the other treatment sessions. Patient will benefit from skilled therapy to improve pelvic floor strength and coordination so she is not bulging the pelvic floor when she is to contract it.      OBJECTIVE IMPAIRMENTS decreased activity tolerance, decreased cognition, decreased coordination, decreased endurance, decreased strength, and increased fascial restrictions.    ACTIVITY LIMITATIONS continence, toileting, and locomotion level   PARTICIPATION LIMITATIONS: community activity   PERSONAL FACTORS Age, Fitness, and 3+ comorbidities:    Abdominal hysterectomy; colon surgery; Alzheimer, Demnetia; Diabetes are also affecting patient's functional outcome.    REHAB POTENTIAL: Good   CLINICAL DECISION MAKING: Evolving/moderate complexity   EVALUATION COMPLEXITY: Moderate     GOALS: Goals reviewed with patient? Yes   SHORT TERM GOALS: Target date: 03/03/2022   Patient is able to contract the pelvic floor instead of bulging.  Baseline: Goal status: Met 03/10/2022  LONG TERM GOALS: Target date: 04/28/2022    Patient is able to perform her HEP with the help of her caregivers and sister.  Baseline:  Goal status: INITIAL   2.  Patient is on a walking program daily for 5-10 minutes to improve her mobility to get to the bathroom in time.  Baseline:  Goal status: ongoing 03/10/2022   3.  Pelvic floor strength is 3/5 with 5 out of 10 contractions so she is able to reduce her urinary leakage.  Baseline:  Goal status: INITIAL   4.  Patient and her sister are instructed on the urge to void behavioral technique to  reduce her urgency  3 out 5 times when she has it.  Baseline:  Goal status: INITIAL   PLAN: PT FREQUENCY: 1x/week   PT DURATION: 12 weeks   PLANNED INTERVENTIONS: Therapeutic exercises, Therapeutic activity, Neuromuscular re-education, Balance training, Gait training, Patient/Family education, Self Care, Dry Needling, Biofeedback, and Manual therapy   PLAN FOR NEXT SESSION: go over HEP, possible discharge  Earlie Counts, PT 03/10/22 4:46 PM

## 2022-03-15 ENCOUNTER — Encounter: Payer: Medicare Other | Attending: Obstetrics and Gynecology | Admitting: Physical Therapy

## 2022-03-15 ENCOUNTER — Encounter: Payer: Self-pay | Admitting: Physical Therapy

## 2022-03-15 DIAGNOSIS — N393 Stress incontinence (female) (male): Secondary | ICD-10-CM | POA: Diagnosis not present

## 2022-03-15 DIAGNOSIS — R279 Unspecified lack of coordination: Secondary | ICD-10-CM | POA: Insufficient documentation

## 2022-03-15 DIAGNOSIS — M6281 Muscle weakness (generalized): Secondary | ICD-10-CM | POA: Insufficient documentation

## 2022-03-15 NOTE — Therapy (Signed)
OUTPATIENT PHYSICAL THERAPY TREATMENT NOTE   Patient Name: Theresa Mendez MRN: [id number] DOB:May 29, 1946, 76 y.o., female Today's Date: 03/15/2022  PCP: Clovia Cuff, MD REFERRING PROVIDER: Jaquita Folds, MD  END OF SESSION:   PT End of Session - 03/15/22 1313     Visit Number 5    Date for PT Re-Evaluation 04/28/22    Authorization Type [employer] medicare    Authorization - Visit Number 5    Authorization - Number of Visits 10    PT Start Time 1300    PT Stop Time 0076    PT Time Calculation (min) 45 min    Activity Tolerance Patient tolerated treatment well    Behavior During Therapy WFL for tasks assessed/performed             Past Medical History:  Diagnosis Date   Alzheimer disease ([place])    Dementia ([place])    Diabetes mellitus without complication ([place])    Hypertension    Past Surgical History:  Procedure Laterality Date   ABDOMINAL HYSTERECTOMY     COLON SURGERY     Patient Active Problem List   Diagnosis Date Noted   Antiplatelet or antithrombotic long-term use 12/06/2021   Unsteady gait when walking 08/17/2021   Chronic anxiety 01/26/2021   History of cerebrovascular accident 01/26/2021   Muscle pain 01/26/2021   Palpitations 01/26/2021   Cough 09/03/2020   CAP (community acquired pneumonia) 05/06/2020   Hyperglycemia due to diabetes mellitus ([place]) 05/06/2020   Leukocytosis 05/06/2020   Viral pneumonia 05/06/2020   Type II diabetes mellitus, uncontrolled 04/17/2020   Low back pain 11/26/2019   Uncontrolled type 2 diabetes mellitus with complication, without long-term current use of insulin 11/26/2015   Sleep disorder 03/02/2015   Hyperlipidemia associated with type 2 diabetes mellitus ([place]) 04/24/2014   Hypertension associated with diabetes ([place]) 04/24/2014   Anxiety and depression 03/21/2013   Alzheimer's disease ([place]) 09/18/2012   S/P colonoscopy with polypectomy 10/06/2011   Spinal stenosis of lumbar region 07/29/2010   LVH (left  ventricular hypertrophy) 08/05/2009   Vitamin D deficiency 08/05/2009   DJD (degenerative joint disease), cervical 05/20/2009   Stroke ([place]) 05/20/2009   Breast calcification seen on mammogram 01/26/2009   REFERRING DIAG: N39.3 (ICD-10-CM) - SUI (stress urinary incontinence, female)   THERAPY DIAG:  Muscle weakness (generalized)   Unspecified lack of coordination   Rationale for Evaluation and Treatment Rehabilitation   ONSET DATE: 01/2021   SUBJECTIVE:  SUBJECTIVE STATEMENT: Patient pull up was wet one day over night.     PAIN:  Are you having pain? No     PRECAUTIONS: Other: memory   WEIGHT BEARING RESTRICTIONS No   FALLS:  Has patient fallen in last 6 months? No   LIVING ENVIRONMENT: Lives with: lives with their family, has caregivers   OCCUPATION: retired   PLOF: Requires assistive device for independence   PATIENT GOALS reduce the urgency    PERTINENT HISTORY:  Abdominal hysterectomy; colon surgery; Alzheimer, Demnetia; Diabetes   BOWEL MOVEMENT Pain with bowel movement: No Type of bowel movement:Type (Bristol Stool Scale) Type 4, Frequency 1 time per day, Strain Yes, and Splinting no ; strains sometimes Fully empty rectum: No Leakage: No Fiber supplement: Yes: getting more fiber in the diet   URINATION Pain with urination: No Fully empty bladder: No Stream: Strong Urgency: Yes: laughing,   running water Frequency: urinates every few hours, 1 x night void Leakage: Coughing and Laughing, leaks 0-1 time per day, more like the urgency Pads: Yes: 1-2 pads per day   INTERCOURSE; Not sexually active   PREGNANCY Vaginal deliveries 1     PROLAPSE None       OBJECTIVE:    DIAGNOSTIC FINDINGS:  Pelvic floor strength I/V; A PVR of 104 ml was obtained by bladder  scan.   PATIENT SURVEYS:  None done to her Alzheimer's       COGNITION:            Overall cognitive status: Impaired      Needs help to remember due to the dementia and Alzheimers                   SENSATION:            Light touch: Appears intact            Proprioception: Appears intact     GAIT: Distance walked: walks slowly Assistive device utilized: Hemi walker Level of assistance: Modified independence                  POSTURE: No Significant postural limitations                LOWER EXTREMITY MMT:   MMT Right 03/15/2022 Left 03/15/2022  Hip flexion 4/5 4/5  Hip extension 4/5 4/5  Hip abduction 3+/5 3+/5  Hip adduction 4/5 4/5  Knee flexion 4/5 4/5  Knee extension 4/5 4/5     PALPATION:   External Perineal Exam no tenderness                             Internal Pelvic Floor tightness in the perineal body and introitus. She will bulge the pelvic floor when she is trying to contract. She needed many tactile cues to correctly contract the pelvic floor with the abdominals. She was able to do it 2 out of 10 times.    Patient confirms identification and approves PT to assess internal pelvic floor and treatment Yes   PELVIC MMT:   MMT eval 03/10/2022  Vaginal 1/5 1-2/5   (Blank rows = not tested)         TONE: increased   PROLAPSE: None noted   TODAY'S TREATMENT  03/15/2022 Exercises: Stretches/mobility: Strengthening:sitting ball squeeze hold 5 sec 10x with focus on pelvic floor forall exercises  Sitting marching with red band 10 x 3                         Hip abduction with red band 10x 3                         Knee extension 10x 3 with red band                         Sit to stand 10 x, needed verbal cues to not to come down fast into sitting, going into sitting keeping flexed at the hips and come up on an angle.  Gait;  walk 105 feet x2 with rolling walker with a seat and close C.G and verbal cues to stand up tall, keep close  to the walker and smaller steps. She did not have to take a rest.  Therapeutic activities: Functional strengthening activities: Self-care: Discussed with patients sister on having her to void every 2 hours, walking 3 times per day for 1 minute, and doing her exercises daily.     03/10/2022 Manual: Internal pelvic floor techniques:No emotional/communication barriers or cognitive limitation. Patient is motivated to learn. Patient understands and agrees with treatment goals and plan. PT explains patient will be examined in standing, sitting, and lying down to see how their muscles and joints work. When they are ready, they will be asked to remove their underwear so PT can examine their perineum. The patient is also given the option of providing their own chaperone as one is not provided in our facility. The patient also has the right and is explained the right to defer or refuse any part of the evaluation or treatment including the internal exam. With the patient's consent, PT will use one gloved finger to gently assess the muscles of the pelvic floor, seeing how well it contracts and relaxes and if there is muscle symmetry. After, the patient will get dressed and PT and patient will discuss exam findings and plan of care. PT and patient discuss plan of care, schedule, attendance policy and HEP activities.  Therapist finger in the vaginal canal with tactile cues to contract the pelvic floor instead of bearing down. She mostly bears down, Patient is not able to correctly contract the pelvic floor without many tactile cues and verbal cues in supine.  Therapist had patient contract pelvic floor with hip adduction and she was able to contract the pelvic floor to strength 2/5.  Therapist had finge in the vaginal canal and do a bridge with pelvic floor contraction and did not bear down Dry needling: Exercises: Strengthening:Sitting marching with red band 10 x 3                         Hip abduction with red  band 10x 3                         Knee extension 10x 3 with red band                         Sit to stand 10 x   Gait;  walk 105 feet x2 with cane and close C.G and verbal cues to stand up tall and rest 4 times 03/03/2022 Gait:  Walk with single point cane 118 feet with stopping 4 times due to  feeling like she is going to fall. ; walk 10 feet 4 times with single point cane and C.G with verbal cues to keep upright, not keep cane to far ahead Made up a walking program with family member to give to caregiver to walk 1-2 minutes 3 tiems per day Exercises: Stretches/mobility: Strengthening:sitting ball squeeze hold 5 sec 10x with focus on pelvic floor forall exercises                         Sitting marching with red band 10 x 3                         Hip abduction with red band 10x 3                         Knee extension 10x 3 with red band                         Sit to stand 10 x      PATIENT EDUCATION: 03/15/2022 Education details: Access Code: [email or web address] Person educated: Patient Education method: Explanation, Demonstration, Tactile cues, Verbal cues, and Handouts Education comprehension: verbalized understanding, returned demonstration, verbal cues required, tactile cues required, and needs further education     HOME EXERCISE PROGRAM: 03/15/2922 Access Code: [email or web address] URL: [email or web address] Date: 02/10/2022 Prepared by: Earlie Counts   Exercises - Seated Hip Adduction Isometrics with Diona Foley  - 1 x daily - 7 x weekly - 2 sets - 10 reps - Seated Hip Abduction with Resistance  - 1 x daily - 7 x weekly - 2 sets - 10 reps - Seated March with Resistance  - 1 x daily - 7 x weekly - 2 sets - 10 reps - Seated Knee Extension with Resistance  - 1 x daily - 7 x weekly - 3 sets - 10 reps - Sit to Stand with Pelvic Floor Contraction  - 1 x daily - 7 x weekly - 1 sets - 5 reps   ASSESSMENT:   CLINICAL IMPRESSION: Patient is a 76 y.o. female who was seen today for physical  therapy treatment for stress incontinence.Patient is able to do her exercises with someone giving her cues to perform correctly. Patient is not walking at home and needs her caregiver to assist with the walking program. She has trouble remembering due to her dementia and Alzheimer's. She needs verbal cues to keep her walker closer to her and  standing taller. She needs verbal cues to bring her buttocks over the chair to sit and flex at her hips.      OBJECTIVE IMPAIRMENTS decreased activity tolerance, decreased cognition, decreased coordination, decreased endurance, decreased strength, and increased fascial restrictions.    ACTIVITY LIMITATIONS continence, toileting, and locomotion level   PARTICIPATION LIMITATIONS: community activity   PERSONAL FACTORS Age, Fitness, and 3+ comorbidities:    Abdominal hysterectomy; colon surgery; Alzheimer, Demnetia; Diabetes are also affecting patient's functional outcome.    REHAB POTENTIAL: Good   CLINICAL DECISION MAKING: Evolving/moderate complexity   EVALUATION COMPLEXITY: Moderate     GOALS: Goals reviewed with patient? Yes   SHORT TERM GOALS: Target date: 03/03/2022   Patient is able to contract the pelvic floor instead of bulging.  Baseline: Goal status: Met 03/10/2022     LONG TERM GOALS: Target date: 04/28/2022    Patient is able to perform her HEP with the help  of her caregivers and sister.  Baseline:  Goal status: Not met 03/15/2022   2.  Patient is on a walking program daily for 5-10 minutes to improve her mobility to get to the bathroom in time.  Baseline:  Goal status: Not met 03/15/2022   3.  Pelvic floor strength is 3/5 with 5 out of 10 contractions so she is able to reduce her urinary leakage.  Baseline:  Goal status: Not met 03/15/2022   4.  Patient and her sister are instructed on the urge to void behavioral technique to reduce her urgency  3 out 5 times when she has it.  Baseline:  Goal status: Not met 03/15/2022    PLAN:    PLAN FOR NEXT SESSION:  Discharge to HEP with her caregiver and Sister helping her Earlie Counts, PT 03/15/22 1:23 PM   PHYSICAL THERAPY DISCHARGE SUMMARY  Visits from Start of Care: 5  Current functional level related to goals / functional outcomes: See above. Discussed discharge plan with her sister who is her caregiver.    Remaining deficits: See above.    Education / Equipment: HEP   Patient agrees to discharge. Patient goals were not met. Patient is being discharged due to maximized rehab potential.  Thank you for the referral. Earlie Counts, PT 03/15/22 3:03 PM

## 2022-03-24 ENCOUNTER — Encounter: Payer: Medicare Other | Admitting: Physical Therapy

## 2022-04-08 ENCOUNTER — Ambulatory Visit (INDEPENDENT_AMBULATORY_CARE_PROVIDER_SITE_OTHER): Payer: Medicare Other | Admitting: Obstetrics and Gynecology

## 2022-04-08 ENCOUNTER — Encounter: Payer: Self-pay | Admitting: Obstetrics and Gynecology

## 2022-04-08 DIAGNOSIS — N393 Stress incontinence (female) (male): Secondary | ICD-10-CM

## 2022-04-08 NOTE — Progress Notes (Deleted)
[facility]   Subjective:     Chief Complaint: No chief complaint on file.  History of Present Illness: Theresa Mendez is a 76 y.o. female with {PFD symptoms:24771} who presents for a pessary check. She is using a size *** {pessary type:24772} pessary. The pessary has been working well and she has no complaints. She {ACTION; IS/IS FIE:[id number]} using vaginal estrogen. She denies vaginal bleeding.  Past Medical History: Patient  has a past medical history of Alzheimer disease ([place]), Dementia ([place]), Diabetes mellitus without complication ([place]), and Hypertension.   Past Surgical History: She  has a past surgical history that includes Abdominal hysterectomy and Colon surgery.   Medications: She has a current medication list which includes the following prescription(s): acetaminophen, amlodipine, bupropion, clobetasol ointment, duloxetine, ezetimibe, hydralazine, levocetirizine, lisinopril, memantine, metformin, metoprolol tartrate, naproxen sodium, sitagliptin, and spironolactone.   Allergies: Patient is allergic to plavix [clopidogrel].   Social History: Patient  reports that she has quit smoking. Her smoking use included cigarettes. She has a 4.00 pack-year smoking history. She has never used smokeless tobacco. She reports that she does not drink alcohol and does not use drugs.      Objective:    Physical Exam: There were no vitals taken for this visit. Gen: No apparent distress, A&O x 3. Detailed Urogynecologic Evaluation:  Pelvic Exam: Normal external female genitalia; Bartholin's and Skene's glands normal in appearance; urethral meatus {urethra:24773}, no urethral masses or discharge. The pessary was noted to be {in place:24774}. It was removed and cleaned. Speculum exam revealed {vaginal lesions:24775} in the vagina. The pessary was replaced. It was comfortable to the patient and fit well.       No data to display          Laboratory Results: Urine dipstick  shows: {ua dip:315374::"negative for all components"}.    Assessment/Plan:    Assessment: Ms. Peltzer is a 76 y.o. with {PFD symptoms:24771} here for a pessary check. She is doing well.  Plan: She will {pessary plan:24776}. She will continue to use {lubricant:24777}. She will follow-up in *** {days/wks/mos/yrs:310907} for a pessary check or sooner as needed.  All questions were answered.   Time Spent:

## 2022-04-08 NOTE — Progress Notes (Signed)
[facility]   Subjective:     Chief Complaint: No chief complaint on file.  History of Present Illness: Theresa Mendez is a 76 y.o. female with stress incontinence who presents for a pessary check. She is using a size 0 incontinence ring pessary. She does not feel the pessary, has not seen it fall out. Has not been having any leakage.   Went to several PT sessions and has completed therapy. Little progress made due to dementia.   Past Medical History: Patient  has a past medical history of Alzheimer disease ([place]), Dementia ([place]), Diabetes mellitus without complication ([place]), and Hypertension.   Past Surgical History: She  has a past surgical history that includes Abdominal hysterectomy and Colon surgery.   Medications: She has a current medication list which includes the following prescription(s): acetaminophen, amlodipine, bupropion, clobetasol ointment, duloxetine, ezetimibe, hydralazine, levocetirizine, lisinopril, memantine, metformin, metoprolol tartrate, naproxen sodium, sitagliptin, and spironolactone.   Allergies: Patient is allergic to plavix [clopidogrel].   Social History: Patient  reports that she has quit smoking. Her smoking use included cigarettes. She has a 4.00 pack-year smoking history. She has never used smokeless tobacco. She reports that she does not drink alcohol and does not use drugs.      Objective:    Physical Exam: There were no vitals taken for this visit. Gen: No apparent distress, A&O x 3. Detailed Urogynecologic Evaluation:  Pelvic Exam: Normal external female genitalia; Bartholin's and Skene's glands normal in appearance; urethral meatus normal in appearance, no urethral masses or discharge. No pessary was noted in vagina and speculum exam confirmed.     Assessment/Plan:    Assessment: Theresa Mendez is a 76 y.o. with stress incontinence   Plan:  - No pessary noted on exam today- must have fallen out and patient was unaware.  -  Since she is not having bothersome leakage, she declines having another one placed at this time.   She will return as needed  Theresa Folds, MD  Time spent: I spent 20 minutes dedicated to the care of this patient on the date of this encounter to include pre-visit review of records, face-to-face time with the patient and post visit documentation.

## 2022-04-18 ENCOUNTER — Ambulatory Visit: Payer: Medicare Other | Admitting: Physician Assistant

## 2022-04-18 NOTE — Progress Notes (Incomplete)
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 RH female  with a history of hypertension, hyperlipidemia, diabetes, Alzheimer's disease with behavioral disturbance seen today in follow up for memory loss. Patient is currently on memantine 10 mg bid and donepezil 10 mg daily. She is on Buproprion for mood. MRI brain personally reviewed was remarkable for       Follow up in   months. Continue to control mood as per PCP Recommend good control of cardiovascular risk factors.   Continue Memantine 10 mg twice daily. Side effects were discussed  Continue donepezil 10 mg daily. Side effects were discussed     Subjective:    This patient is accompanied in the office by her sister  who supplements the history.  Previous records as well as any outside records available were reviewed prior to todays visit. Last seen on 10/13/21   Any changes in memory since last visit? repeats oneself?  Endorsed. She constantly repeats that she "works at [employer] when walking into a room?  Soemtines she cannot remember what she came to the room for and has tobe redirected. Leaving objects in unusual places?  Patient denies   Ambulates  with difficulty?  She uses a R cane*** Doing PT and OT*** Recent falls?  Patient denies   Any head injuries?  Patient denies   History of seizures?   Patient denies   Wandering behavior?  Patient denies   Patient drives?   Patient no longer drives  Any mood changes since last visit?  Patient denies   Any worsening depression?:  Patient denies   Hallucinations?  Auditory hallucinations*** Paranoia?  Patient denies   Patient reports that sleeps well without vivid dreams, REM behavior or sleepwalking. Sometimes she wakes up at 4 am. She only sleeps with the light on History of sleep apnea?  Patient denies   Any hygiene concerns?  Patient denies   Independent of bathing and  dressing?  Endorsed  Does the patient needs help with medications? Sister  in charge  Who is in charge of the finances? Son  is in charge   Any changes in appetite?  Patient denies  *** Patient have trouble swallowing? Patient denies   Does the patient cook?  Patient denies   Any kitchen accidents such as leaving the stove on? Patient denies   Any headaches?  Patient denies   Double vision? Patient denies   Any focal numbness or tingling?  Patient denies   Chronic back pain Patient denies   Unilateral weakness?  Patient denies   Any tremors?  Patient denies   Any history of anosmia?  Patient denies   Any incontinence of urine?  Endorsed*** Any bowel dysfunction?   Patient denies      Patient lives with:   History on Initial Assessment 02/27/2020: This is a 76 year old left-handed woman with a history of hypertension, hyperlipidemia, diabetes, dementia, presenting to establish care. She was previously living in [place] and moved to [facility] to be closer to her sister and POA. She states her memory is not good. Records were reviewed. Records indicate that memory changes started in 2014, she tended to be repetitive.  Prior Neuropsychological testing in 2014 and 2016 indicated an amnestic profile suggestive of Alzheimer's disease. MRI brain in 2014 showed significant frontal, parietal, and temporal atrophy. No significant vascular changes. MOCA score 15/30 in 08/2017 on her last visit with her neurologist. She  is on Donepezil 10mg  daily and Memantine ER 14mg  daily without side effects. She states her husband is still living in their house (he is deceased). She asks "have I been diagnosed with Alzheimer's?" and says she recently retired from work that was stressful, working in [place]. She retired in 2013/2014. Medications are administered at [facility]. She asks "where is [employer]?" Her older sister with dementia also resides there. Her POA manages finances. She does not drive. She states she is  independent with dressing and bathing, Theresa Mendez reminds her that she is assisted with this. She goes to the dining hall for meals. She states sleep is good. She denies any headaches, dizziness, diplopia, dysarthria/dysphagia, focal numbness/tingling/weakness, bowel/bladder dysfunction, anosmia, or tremors. She has chronic back pain and is doing physical therapy, although she does not recall this and her sister reminds her she has it several times a week. No personality changes, paranoia. She was previously on lorazepam but has not needed it. Sometimes she has mentioned seeing snakes, but not regularly. There is a strong family history of dementia in her mother, maternal grandmother, older sister. She does not drink alcohol.    PREVIOUS MEDICATIONS:   CURRENT MEDICATIONS:  Outpatient Encounter Medications as of 04/18/2022  Medication Sig   acetaminophen (TYLENOL) 325 MG tablet Take 650 mg by mouth every 6 (six) hours as needed for moderate pain.    amLODipine (NORVASC) 5 MG tablet amlodipine 5 mg tablet  TAKE 1 TABLET BY MOUTH EVERY DAY   buPROPion (WELLBUTRIN) 75 MG tablet Take by mouth.   clobetasol ointment (TEMOVATE) 0.05 % clobetasol 0.05 % topical ointment  PLEASE SEE ATTACHED FOR DETAILED DIRECTIONS   DULoxetine (CYMBALTA) 30 MG capsule duloxetine 30 mg capsule,delayed release  TAKE ONE CAPSULE BY MOUTH EVERY DAY   ezetimibe (ZETIA) 10 MG tablet ezetimibe 10 mg tablet  TAKE 1 TABLET BY MOUTH EVERY DAY FOR CHOLESTEROL   hydrALAZINE (APRESOLINE) 100 MG tablet Take 100 mg by mouth 3 (three) times daily.   levocetirizine (XYZAL) 5 MG tablet levocetirizine 5 mg tablet  TAKE 1 TABLET BY MOUTH EVERY DAY   lisinopril (PRINIVIL,ZESTRIL) 40 MG tablet Take 40 mg by mouth daily.   memantine (NAMENDA) 10 MG tablet memantine 10 mg tablet  Take 1 tablet twice a day by oral route.   metFORMIN (GLUCOPHAGE) 500 MG tablet metformin 500 mg tablet  Take 1 tablet twice a day by oral route.   metoprolol tartrate  (LOPRESSOR) 100 MG tablet metoprolol tartrate 100 mg tablet  Take 1 tablet every day by oral route.   naproxen sodium (ANAPROX) 550 MG tablet Take 500 mg by mouth 2 (two) times daily.   sitaGLIPtin (JANUVIA) 100 MG tablet Januvia 100 mg tablet  Take 1 tablet every day by oral route.   spironolactone (ALDACTONE) 25 MG tablet Take 0.5 tablets by mouth daily.   No facility-administered encounter medications on file as of 04/18/2022.       04/15/2021   12:00 PM  MMSE - Mini Mental State Exam  Orientation to time 0  Orientation to Place 0  Registration 3  Attention/ Calculation 0  Recall 0  Language- name 2 objects 2  Language- repeat 1  Language- follow 3 step command 3  Language- read & follow direction 1  Write a sentence 1  Copy design 0  Total score 11       No data to display          Objective:     PHYSICAL  EXAMINATION:    VITALS:  There were no vitals filed for this visit.  GEN:  The patient appears stated age and is in NAD. HEENT:  Normocephalic, atraumatic.   Neurological examination:  General: NAD, well-groomed, appears stated age. Orientation: The patient is alert. Oriented to person, place and date Cranial nerves: There is good facial symmetry.The speech is fluent and clear. No aphasia or dysarthria. Fund of knowledge is appropriate. Recent and remote memory are impaired. Attention and concentration are reduced.  Able to name objects and repeat phrases.  Hearing is intact to conversational tone.    Sensation: Sensation is intact to light touch throughout Motor: Strength is at least antigravity x4. Tremors: none  DTR's 2/4 in UE/LE     Movement examination: Tone: There is normal tone in the UE/LE Abnormal movements:  no tremor.  No myoclonus.  No asterixis.   Coordination:  There is no decremation with RAM's. Normal finger to nose  Gait and Station: The patient has no difficulty arising out of a deep-seated chair without the use of the hands. The  patient's stride length is good.  Gait is cautious and narrow.    Thank you for allowing [place] the opportunity to participate in the care of this nice patient. Please do not hesitate to contact us for any questions or concerns.   Total time spent on today's visit was *** minutes dedicated to this patient today, preparing to see patient, examining the patient, ordering tests and/or medications and counseling the patient, documenting clinical information in the EHR or other health record, independently interpreting results and communicating results to the patient/family, discussing treatment and goals, answering patient's questions and coordinating care.  Cc:  No primary care provider on file.  Sharene Butters 04/18/2022 8:11 AM

## 2022-04-19 NOTE — Progress Notes (Signed)
 Chief Complaint:  Chief Complaint  Patient presents with  . Procedure    F/U; Procedure on 01/26/22; Bilateral L4-5 TFESI-Helped 50 percent  . Back Pain    Lower-center; back hurts    Impression: 1. DDD (degenerative disc disease), lumbar      2. Other spondylosis with radiculopathy, lumbar region      3. Lumbar foraminal stenosis        Plan:    1. Injections: none needed at this time.  2. Adjuvant Medications: Continue medications as prescribed including Cymbalta per primary care.  minimize adjunctive medication as she is afraid of side effects affecting her already significant memory issues.  Specialized treatments: currently in PT once a week  3. Opioid medications: None prescribed. would use caution as patient has been diagnosed with Alzheimer's and struggles with memory  4. Follow up: Follow up if symptoms worsen or fail to improve.  5. The patient's blood pressure was 107/70    History of Present Illness:  Theresa Mendez is a 76 y.o.year old female with a history of Bilateral low back pain with some radiation through lateral legs.  Patient does have significant medical history of Alzheimer's, CVA. Anitta returns to the clinic today having last been seen on 01/26/2022 for procedure.  At this time, the patient continued on a regimen of the following medication(s):  Current Home Medications   Medication Sig  amLODIPine  besylate (NORVASC ) 2.5 mg tablet Take one tablet (2.5 mg dose) by mouth daily.  buPROPion  (WELLBUTRIN ) 75 mg tablet Take one tablet (75 mg dose) by mouth 2 (two) times daily.  DULoxetine HCl (CYMBALTA) 30 mg capsule TAKE ONE CAPSULE (30 MG DOSE) BY MOUTH DAILY.  hydrALAZINE HCl (APRESOLINE) 100 mg tablet Take one tablet (100 mg dose) by mouth 3 (three) times a day.  lisinopril  (PRINIVIL ,ZESTRIL ) 40 mg tablet Take one tablet (40 mg dose) by mouth daily.  memantine  (NAMENDA ) 5 mg tablet Take one tablet (5 mg dose) by mouth 2 (two) times daily. Take 7 mg daily   metoprolol  tartrate (LOPRESSOR ) 100 mg tablet TAKE ONE TABLET 2 TIMES DAILY. CONTACT DR. WINN ([place]) TO GET MAIL SCRIPT/REFILLS.  metoprolol  tartrate (LOPRESSOR ) 100 mg tablet Take one tablet (100 mg dose) by mouth 2 (two) times daily. This is one month supply. Please contact Dr. WINN office in [place] to get your mail script/refills.  potassium chloride (K+8,KLOR CON) 8 mEq CR tablet Take one tablet (8 mEq dose) by mouth daily.  sitaGLIPtin (JANUVIA) 100 mg tablet Take by mouth.  spironolactone (ALDACTONE) 100 MG tablet Take 25 mg by mouth daily.    Since having last been seen, [name]' s pain seems stable.  Her sister accompanies her today and does feel in the gaps of her history secondary to her Alzheimer's. Her sister measures her pain but how well the patient ambulates, which per her report today the patient has been able to do better since the injection,  Improvement from treatments: See below Side effects from medicines: N/A Activity Level- _adequate [place] Behavior- _nil  Procedures: 03/04/2020 bilateral L4-5 TFESI-patient states no improvement but has no more radicular symptoms in office today. 07/22/2021 -bilateral L4-5 TFESI - family verbalized that patient is not complaining of pain as much, and is walking much better. Denies radicular sxs today 01/26/2022 -bilateral L4-5 TFESI  Images: Results for orders placed in visit on 12/31/19  MRI Spine  Lumbar WO IV Contrast  Narrative INDICATION: Back pain or radiculopathy, > 6 wks  COMPARISON: Plain films of the  lumbar spine same date. Previous MR lumbar spine 02/20/2018.  TECHNIQUE: MRI SPINE LUMBAR WO IV CONTRAST.  FINDINGS: #  Impression Osseous structures: Vertebral body heights are maintained. No acute fracture. No pars defect appreciated. No destructive bony changes. #  Alignment:Similar grade 1 anterolisthesis L4-5 measuring approximately 5 mm. #  Conus medullaris/cauda equina: Conus appears  normal and terminates at T12-L1.  #  Lower thoracic spine: No significant spinal canal or foraminal stenosis.  #  T12-L1: Unchanged. Preservation of disc height. Small shallow central disc protrusion indents the ventral thecal sac. No significant spinal canal stenosis. No impingement of the distal spinal cord/conus or traversing nerve roots. No significant foraminal stenosis. #  L1-L2: Unchanged. Preservation of disc height. Minimal disc bulge and marginal spurring. No focal disc herniation or significant stenosis. #  L2-L3: Unchanged. Preservation of disc height. Minimal disc bulge and marginal spurring. Mild facet arthrosis. No focal disc herniation or significant stenosis. #  L3-L4: Unchanged. Preservation of disc height. Mild disc bulge and marginal spurring. Moderate facet arthrosis. Minimal right facet effusion. Thickening of the ligamentum flavum. Mild spinal canal stenosis. No focal disc herniation or impingement of the traversing nerve roots. No significant foraminal stenosis. #  L4-L5: Unchanged. There is again mild loss of disc height. Grade 1 anterolisthesis. Generalized disc bulge. Severe facet arthrosis with hypertrophic changes and small effusions. Thickening of the ligamentum flavum. Severe spinal canal/lateral recess stenosis. Moderate bilateral foraminal stenosis, worse on the left. 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 #  L5-S1: Unchanged. Preservation of disc height. Mild disc bulge and marginal spurring. Mild facet arthrosis. No focal disc herniation or significant stenosis.  #  Paraspinal tissues: Unremarkable  #  Contrast: None given.  #  Additional comments: None.   IMPRESSION: 1.  Similar multilevel DDD and facet arthrosis.[name] FINDINGS again most prominent at L4-5 with there is grade 1 degenerative spondylolisthesis, generalized disc bulge and posterior hypertrophic changes resulting in severe spinal canal/lateral  recess stenosis as well as moderate bilateral foraminal stenosis. Mild spinal canal stenosis L3-4. Spinal canal and neural foramina otherwise widely patent.. 2.  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 3.  No acute fracture.  Electronically Signed by: Elsie Dayhoff   Narcotic Violations: N/A   Pill count: N/A  Last RUDS N/A   Past Medical History:  History reviewed. No pertinent past medical history.  Psychiatric History:  positive for fatigue and Memory issues  Past Surgical History:  History reviewed. No pertinent surgical history.  Allergies:  Allergies  Allergen Reactions  . Simvastatin Other  . Clopidogrel Other    Family stated she had a reaction     Medications History:  Outpatient Medications Marked as Taking for the 04/19/22 encounter (Office Visit) with Kao Ly, NP  Medication Sig Dispense Refill  . amLODIPine  besylate (NORVASC ) 2.5 mg tablet Take one tablet (2.5 mg dose) by mouth daily.    . buPROPion  (WELLBUTRIN ) 75 mg tablet Take one tablet (75 mg dose) by mouth 2 (two) times daily.    . DULoxetine HCl (CYMBALTA) 30 mg capsule TAKE ONE CAPSULE (30 MG DOSE) BY MOUTH DAILY. 30 capsule 2  . hydrALAZINE HCl (APRESOLINE) 100 mg tablet Take one tablet (100 mg dose) by mouth 3 (three) times a day.    . lisinopril  (PRINIVIL ,ZESTRIL ) 40 mg tablet Take one tablet (40 mg dose) by mouth daily.    . memantine  (NAMENDA ) 5 mg tablet  Take one tablet (5 mg dose) by mouth 2 (two) times daily. Take 7 mg daily    . metoprolol  tartrate (LOPRESSOR ) 100 mg tablet TAKE ONE TABLET 2 TIMES DAILY. CONTACT DR. WINN ([place]) TO GET MAIL SCRIPT/REFILLS. 60 tablet 0  . metoprolol  tartrate (LOPRESSOR ) 100 mg tablet Take one tablet (100 mg dose) by mouth 2 (two) times daily. This is one month supply. Please contact Dr. WINN office in [place] to get your mail script/refills. 180 tablet 3  . potassium  chloride (K+8,KLOR CON) 8 mEq CR tablet Take one tablet (8 mEq dose) by mouth daily.    . sitaGLIPtin (JANUVIA) 100 mg tablet Take by mouth.    . spironolactone (ALDACTONE) 100 MG tablet Take 25 mg by mouth daily.      Social History:  Social History   Socioeconomic History  . Marital status: Married    Spouse name: Not on file  . Number of children: Not on file  . Years of education: Not on file  . Highest education level: Not on file  Occupational History  . Not on file  Tobacco Use  . Smoking status: Former    Packs/day: .25    Types: Cigarettes    Quit date: 06/13/2006    Years since quitting: 15.8  . Smokeless tobacco: Never  Vaping Use  . Vaping Use: Never used  Substance and Sexual Activity  . Alcohol use: Never  . Drug use: Never  . Sexual activity: Not on file  Other Topics Concern  . Not on file  Social History Narrative  . Not on file   Social Determinants of Health   Financial Resource Strain: Not on file  Food Insecurity: No Food Insecurity (06/30/2021)   Hunger Vital Sign   . Worried About Programme researcher, broadcasting/film/video in the Last Year: Never true   . Ran Out of Food in the Last Year: Never true  Transportation Needs: Not on file  Physical Activity: Not on file  Stress: Not on file  Social Connections: Unknown (10/25/2021)   Social Network   . Social Network: Not on file  Intimate Partner Violence: Unknown (09/16/2021)   HITS   . Physically Hurt: Not on file   . Insult or Talk Down To: Not on file   . Threaten Physical Harm: Not on file   . Scream or Curse: Not on file  Housing Stability: Not on file    Family History:  Family History  Problem Relation Age of Onset  . Stroke Paternal Grandfather     Review of Systems:    Review of Systems  Musculoskeletal: Positive for arthralgias, back pain and gait problem.   Denies new numbness or weakness     Denies current chest pain, SOB, Oversedation     Physical Exam:   BP: 107/70 Resp: 16    Pulse: 61         SPO2:     General:  Alert and oriented, No acute distress.   Eye:  Pupils are equal, round and reactive to light, Normal conjunctiva.   Respiratory:  Respirations are non-labored, Symmetrical chest wall expansion.   Cardiovascular:  Normal rate, Normal peripheral perfusion.   Integumentary:  Warm, Dry, Pink, No rash.     Cognition and Speech:  Oriented, Speech clear and coherent.   Psychiatric:  Cooperative, Appropriate mood & affect, Normal judgment.       Back:  Facet loading maneuvers are mildly positive bilaterally    Neuro: muscle tone and  strength normal and symmetric, sensation grossly normal and difficulty standing without help, difficulty walking without assistance   Patient's pain was assessed, documented as positive, unless stated in the HPI, and a follow up plan has been documented in the Plan. Patient's medication list was reviewed and updated if applicable.  Body Mass Index (BMI) screening was documented and if the patient's BMI was greater than 25 or less than 18.5, the patient was asked to follow up with their PCP for a full assessment regarding weight management. If Fluoroscopy was used during a procedure, the radiation exposure time and number of images were documented within the record.   [place]  Prescription Monitoring Program was reviewed and appeared appropriate.   Kao Ly, NP 04/21/2022 / 11:15 PM

## 2022-04-21 ENCOUNTER — Encounter: Payer: Self-pay | Admitting: Physician Assistant

## 2022-04-21 ENCOUNTER — Ambulatory Visit (INDEPENDENT_AMBULATORY_CARE_PROVIDER_SITE_OTHER): Payer: Medicare Other | Admitting: Physician Assistant

## 2022-04-21 VITALS — BP 113/72 | HR 67 | Resp 18 | Ht 61.0 in | Wt 158.0 lb

## 2022-04-21 DIAGNOSIS — F0280 Dementia in other diseases classified elsewhere without behavioral disturbance: Secondary | ICD-10-CM

## 2022-04-21 DIAGNOSIS — G301 Alzheimer's disease with late onset: Secondary | ICD-10-CM | POA: Diagnosis not present

## 2022-04-21 NOTE — Progress Notes (Signed)
Assessment/Plan:   Dementia likely due to Alzheimer's Disease with Behavioral Disturbance  Theresa Mendez is a very pleasant 76 y.o. LH female seen today in follow up for memory loss. Patient is currently on memantine 10 mg bid .  She was post to be on donepezil 10 mg daily, but according to the patient, she went to a functional medicine provider, who discontinued that medication in the past.  For mood she is on Buproprion and Cymbalta, tolerating well.  From the cognitive standpoint, the patient is stable.    Follow up in 6 months. Continue 24/7 supervision Continue Memantine 10 mg twice daily Side effects were discussed. Will consider donepezil if no contraindications, patient and sister are to confirm and will call us.  Recommend good control of cardiovascular risk factors.   Continue to control mood as per PCP, continue Bupropion for anxiety and Cymbalta for depression, side effects discussed Agree with PT OT for strength and balance Agree with adult day program for socialization    Subjective:    This patient is accompanied in the office by her sister who supplements the history.  Previous records as well as any outside records available were reviewed prior to todays visit. Last seen on 10/13/21.  Last MMSE on November 2022 was 11/30   Any changes in memory since last visit?  She has short attention and gets tired with some tasks, but able to wash dishes, which she enjoys.  She watches TV most of the day, but her family placed her on the wait list for we have all day program so that she can increase her social activities.   repeats oneself?  Endorsed. She repeats " I work at [employer] several times  Disoriented when walking into a room?  Endorsed. She does not recall at times what she came to the room for and has to be redirected. Confuses the bathroom with the kitchen and wants to urinate in the kitchen.  Leaving objects in unusual places?  She pays all paper towels, fall, she likes to  pick stuff around the house, place hitting her drawers.   Ambulates  with difficulty?   She has a history of chronic lower back pain, uses a cane and walker for stability and family reports that walks slower, she is on PT as arranged by PCP. Recent falls?  "A couple of times she may go down, not able to figure out the steps, she may entangle her legs while walking " Any head injuries?  Patient denies   History of seizures?   Patient denies   Wandering behavior?  Patient denies   Patient drives?   Patient no longer drives  Any mood changes since last visit?  "Less sundowning " than prior. She talks about driving home to [place] sometimes Any worsening depression?:  Patient denies   Hallucinations?  Sister reports that she is "hearing sounds. Once she saw a man that was not there " Paranoia?  Patient denies , but sister says she hides the cane under the bed for safety Patient reports that sleeps well without vivid dreams, REM behavior or sleepwalking . Sometimes she wakes up around 4 am and walks around the house.  She no longer has to fall asleep with the light on History of sleep apnea?  Patient denies   Any hygiene concerns? Needs to be reminded to shower or change. Caregiver lies the clothing to wear. Puts clothing in layers if let alone, or sometimes she attempts to put both legs  in the same side of the pool Does the patient needs help with medications? Caregiver in charge  Who is in charge of the finances? Son  and Sister in charge   Any changes in appetite?  Patient denies, but sometimes she forgets to eat. Does not drink enough water. "Trying to" Patient have trouble swallowing? Patient denies   Does the patient cook?  Patient denies   Any kitchen accidents such as leaving the stove on? Patient denies   Any headaches?  Patient denies   Double vision? Patient denies   Any focal numbness or tingling?  Patient denies   Chronic back pain Endorsed,  to do PT  Unilateral weakness?  Patient  denies   Any tremors?  Patient denies   Any history of anosmia?  Patient denies   Any incontinence of urine? Endorsed, wears diapers Any bowel dysfunction?   Patient denies      Patient lives with: her sister and a roommate     History on Initial Assessment 02/27/2020: This is a 76 year old left-handed woman with a history of hypertension, hyperlipidemia, diabetes, dementia, presenting to establish care. She was previously living in [place] and moved to [place] to be closer to her sister and POA. She states her memory is not good. Records were reviewed. Records indicate that memory changes started in 2014, she tended to be repetitive.  Prior Neuropsychological testing in 2014 and 2016 indicated an amnestic profile suggestive of Alzheimer's disease. MRI brain in 2014 showed significant frontal, parietal, and temporal atrophy. No significant vascular changes. MOCA score 15/30 in 08/2017 on her last visit with her neurologist. She is on Donepezil 10mg  daily and Memantine ER 14mg  daily without side effects. She states her husband is still living in their house (he is deceased). 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 Medications are administered at [facility]. She asks "where is 11-26-1976?" Her older sister with dementia also resides there. Her POA manages finances. She does not drive. She states she is independent with dressing and bathing, [facility] reminds her that she is assisted with this. She goes to the dining hall for meals. She states sleep is good. She denies any headaches, dizziness, diplopia, dysarthria/dysphagia, focal numbness/tingling/weakness, bowel/bladder dysfunction, anosmia, or tremors. She has chronic back pain and is doing physical therapy, although she does not recall this and her sister reminds her she has it several times a week. No personality changes, paranoia. She  was previously on lorazepam but has not needed it. Sometimes she has mentioned seeing snakes, but not regularly. There is a strong family history of dementia in her mother, maternal grandmother, older sister. She does not drink alcohol.    PREVIOUS MEDICATIONS:   CURRENT MEDICATIONS:  Outpatient Encounter Medications as of 04/21/2022  Medication Sig   acetaminophen (TYLENOL) 325 MG tablet Take 650 mg by mouth every 6 (six) hours as needed for moderate pain.    amLODipine (NORVASC) 5 MG tablet amlodipine 5 mg tablet  TAKE 1 TABLET BY MOUTH EVERY DAY   buPROPion (WELLBUTRIN) 75 MG tablet Take by mouth.   clobetasol ointment (TEMOVATE) 0.05 % clobetasol 0.05 % topical ointment  PLEASE SEE ATTACHED FOR DETAILED DIRECTIONS   DULoxetine (CYMBALTA) 30 MG capsule duloxetine 30 mg capsule,delayed release  TAKE ONE CAPSULE BY MOUTH EVERY DAY   ezetimibe (ZETIA) 10 MG tablet ezetimibe 10 mg tablet  TAKE 1  TABLET BY MOUTH EVERY DAY FOR CHOLESTEROL   hydrALAZINE (APRESOLINE) 100 MG tablet Take 100 mg by mouth 3 (three) times daily.   levocetirizine (XYZAL) 5 MG tablet levocetirizine 5 mg tablet  TAKE 1 TABLET BY MOUTH EVERY DAY   lisinopril (PRINIVIL,ZESTRIL) 40 MG tablet Take 40 mg by mouth daily.   memantine (NAMENDA) 10 MG tablet memantine 10 mg tablet  Take 1 tablet twice a day by oral route.   metFORMIN (GLUCOPHAGE) 500 MG tablet metformin 500 mg tablet  Take 1 tablet twice a day by oral route.   metoprolol tartrate (LOPRESSOR) 100 MG tablet metoprolol tartrate 100 mg tablet  Take 1 tablet every day by oral route.   naproxen sodium (ANAPROX) 550 MG tablet Take 500 mg by mouth 2 (two) times daily.   sitaGLIPtin (JANUVIA) 100 MG tablet Januvia 100 mg tablet  Take 1 tablet every day by oral route.   spironolactone (ALDACTONE) 25 MG tablet Take 0.5 tablets by mouth daily.   No facility-administered encounter medications on file as of 04/21/2022.       04/15/2021   12:00 PM  MMSE - Mini Mental  State Exam  Orientation to time 0  Orientation to Place 0  Registration 3  Attention/ Calculation 0  Recall 0  Language- name 2 objects 2  Language- repeat 1  Language- follow 3 step command 3  Language- read & follow direction 1  Write a sentence 1  Copy design 0  Total score 11       No data to display          Objective:     PHYSICAL EXAMINATION:    VITALS:   Vitals:   04/21/22 1111  Resp: 18  Height: 5\' 1"  (1.549 m)    GEN:  The patient appears stated age and is in NAD. HEENT:  Normocephalic, atraumatic.   Neurological examination:  General: NAD, well-groomed, appears stated age. Orientation: The patient is alert. Oriented to person, place and date Cranial nerves: There is good facial symmetry.The speech is fluent and clear. No aphasia or dysarthria. Fund of knowledge is reduced. Recent and remote memory are impaired. Attention and concentration are reduced.  Able to name objects and repeat phrases.  Hearing is intact to conversational tone.    Sensation: Sensation is intact to light touch throughout Motor: Strength is at least antigravity x4. Tremors: none  DTR's 2/4 in UE/LE     Movement examination: Tone: There is normal tone in the UE/LE Abnormal movements:  no tremor.  No myoclonus.  No asterixis.   Coordination:  There is some decremation with RAM's. Normal finger to nose  Gait and Station: The patient had difficulty arising out of a deep-seated chair without the use of the hands.  She is in a wheelchair, gait has not been tested.   Thank you for allowing the opportunity to participate in the care of this nice patient. Please do not hesitate to contact us for any questions or concerns.   Total time spent on today's visit was 31 minutes dedicated to this patient today, preparing to see patient, examining the patient, ordering tests and/or medications and counseling the patient, documenting clinical information in the EHR or other health record,  independently interpreting results and communicating results to the patient/family, discussing treatment and goals, answering patient's questions and coordinating care.  Cc:  Pcp, No  [place] 04/21/2022 12:22 PM

## 2022-04-21 NOTE — Patient Instructions (Addendum)
1. Check on medications, how is she taking Memantine  10 mg 2 times a day 3. Continue  the mood medications   5.Continue using a walker and the cane  6 Follow-up in 6 months, call for any changes  7. Feel free to visit Facebook page " [email or web address]" for tips of how to care for people with memory problems.     FALL PRECAUTIONS: Be cautious when walking. Scan the area for obstacles that may increase the risk of trips and falls. When getting up in the mornings, sit up at the edge of the bed for a few minutes before getting out of bed. Consider elevating the bed at the head end to avoid drop of blood pressure when getting up. Walk always in a well-lit room (use night lights in the walls). Avoid area rugs or power cords from appliances in the middle of the walkways. Use a walker or a cane if necessary and consider physical therapy for balance exercise. Get your eyesight checked regularly.   HOME SAFETY: Consider the safety of the kitchen when operating appliances like stoves, microwave oven, and blender. Consider having supervision and share cooking responsibilities until no longer able to participate in those. Accidents with firearms and other hazards in the house should be identified and addressed as well.   ABILITY TO BE LEFT ALONE: If patient is unable to contact 911 operator, consider using LifeLine, or when the need is there, arrange for someone to stay with patients. Smoking is a fire hazard, consider supervision or cessation. Risk of wandering should be assessed by caregiver and if detected at any point, supervision and safe proof recommendations should be instituted.  RECOMMENDATIONS FOR ALL PATIENTS WITH MEMORY PROBLEMS: 1. Continue to exercise (Recommend 30 minutes of walking everyday, or 3 hours every week) 2. Increase social interactions - continue going to [place] and enjoy social gatherings with friends and family 3. Eat healthy, avoid fried foods and eat more fruits and vegetables 4.  Maintain adequate blood pressure, blood sugar, and blood cholesterol level. Reducing the risk of stroke and cardiovascular disease also helps promoting better memory. 5. Avoid stressful situations. Live a simple life and avoid aggravations. Organize your time and prepare for the next day in anticipation. 6. Sleep well, avoid any interruptions of sleep and avoid any distractions in the bedroom that may interfere with adequate sleep quality 7. Avoid sugar, avoid sweets as there is a strong link between excessive sugar intake, diabetes, and cognitive impairment The Mediterranean diet has been shown to help patients reduce the risk of progressive memory disorders and reduces cardiovascular risk. This includes eating fish, eat fruits and green leafy vegetables, nuts like almonds and hazelnuts, walnuts, and also use olive oil. Avoid fast foods and fried foods as much as possible. Avoid sweets and sugar as sugar use has been linked to worsening of memory function.  There is always a concern of gradual progression of memory problems. If this is the case, then we may need to adjust level of care according to patient needs. Support, both to the patient and caregiver, should then be put into place.

## 2022-06-16 DIAGNOSIS — L299 Pruritus, unspecified: Secondary | ICD-10-CM | POA: Insufficient documentation

## 2022-06-16 DIAGNOSIS — L853 Xerosis cutis: Secondary | ICD-10-CM | POA: Insufficient documentation

## 2022-06-16 DIAGNOSIS — L668 Other cicatricial alopecia: Secondary | ICD-10-CM | POA: Insufficient documentation

## 2022-06-16 NOTE — Progress Notes (Signed)
 Theresa Mendez  New Hair Loss  History of Present Illness This is a 77 y.o. female who presents for evaluation of hair loss. A patch started on the vertex 25 yr ago. She has extreme itch on the scalp. She has seen a dermatologist in the past and used a regimen, but they are not sure what all this was. Patient has Alzheimers and her sister is the care giver.  She also has dry skin issues and itching on the back.  Washing with unknown body wash or Dove and moisturizing with [employer] lotion. Already has dermasmooth oil for the body but is not clear how to use.   Past Medical History: Active Ambulatory Problems    Diagnosis Date Noted  . No Active Ambulatory Problems   Resolved Ambulatory Problems    Diagnosis Date Noted  . No Resolved Ambulatory Problems   No Additional Past Medical History   Family History: No family history on file.   Social History: Social History   Socioeconomic History  . Marital status: Not on file    Spouse name: Not on file  . Number of children: Not on file  . Years of education: Not on file  . Highest education level: Not on file  Occupational History  . Not on file  Tobacco Use  . Smoking status: Not on file  . Smokeless tobacco: Not on file  Substance and Sexual Activity  . Alcohol use: Not on file  . Drug use: Not on file  . Sexual activity: Not on file  Other Topics Concern  . Not on file  Social History Narrative  . Not on file   Social Determinants of Health   Food Insecurity: Not on file  Transportation Needs: Not on file  Living Situation: Not on file    Review of systems is negative for any other lumps, bumps or other dermatologic conditions of concern. She denies any recent fevers or chills.     Objective: BP 126/67 (Site: Left arm, Position: Sitting, BP Cuff Size: Medium)   Pulse 56   Resp 18   Ht 1.6 m (5' 3)   Wt 73.9 kg (163 lb)   SpO2 100%   BMI 28.87 kg/m   She is well nourished and well developed and alert and  oriented X3. Physical Examination of the scalp, hair, face including eyebrow, eyelids, conjunctiva, nose, lips, ears, neck, chest, and exposed areas of bilateral upper extremities including fingernails is significant for:      Assessment: 1. 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 Scalp and body pruritus  Plan: 1. Discussed diagnoses and treatment options. 2. Begin Ketoconazole shampoo weekly to every other week 3. Begin Lidex ointment to the scalp 4 times per week 4. Recommend mild cleanser such as Cerave hydrating cleanser for face and body and use Plain Vaseline on the body.  Can also use Eucerin cream or lotion for moisturizer to the body.  5. Fluocinolone acetonide oil is good for the back twice daily for 2 wk, then daily x 2 wk, then every other day as needed.  For flares, start again 6. Can try ILK if not helpful.  7. Side effects discussed and patient recommended to read package insert of all medications.   RTC in 4 months   Electronically signed by: Greig JINNY Blower, MD 06/16/2022 9:42 AM     Electronically signed by: Blower Greig JINNY, MD 06/19/22 [phone]

## 2022-06-28 ENCOUNTER — Ambulatory Visit (INDEPENDENT_AMBULATORY_CARE_PROVIDER_SITE_OTHER): Payer: Medicare PPO | Admitting: Podiatry

## 2022-06-28 VITALS — BP 119/62

## 2022-06-28 DIAGNOSIS — E119 Type 2 diabetes mellitus without complications: Secondary | ICD-10-CM

## 2022-06-28 DIAGNOSIS — M79674 Pain in right toe(s): Secondary | ICD-10-CM

## 2022-06-28 DIAGNOSIS — M79675 Pain in left toe(s): Secondary | ICD-10-CM

## 2022-06-28 DIAGNOSIS — B351 Tinea unguium: Secondary | ICD-10-CM | POA: Diagnosis not present

## 2022-06-28 NOTE — Progress Notes (Signed)
  Subjective:  Patient ID: Theresa Mendez, female    DOB: 03/06/1946,  MRN: [id number]  Chief Complaint  Patient presents with   Nail Problem    [facility] BS-did not check today A1C-6.? PCP-[facility] [facility]) PCP VST-3 months ago   Theresa Mendez presents to clinic today for:  Chief Complaint  Patient presents with   Nail Problem    [facility] BS-did not check today A1C-6.? PCP-[facility]([address]) PCP VST-3 months ago  . PCP is Pcp, No.  Allergies  Allergen Reactions   Plavix [Clopidogrel]     Family stated she had a reaction     Review of Systems: Negative except as noted in the HPI.  Objective: No changes noted in today's physical examination. Vitals:   06/28/22 1529  BP: 119/62   Theresa Mendez is a pleasant 77 y.o. female in NAD. AAO x 3.  Vascular Examination: Capillary refill time <3 seconds b/l LE. Palpable pedal pulses b/l LE. Digital hair absent.No pain with calf compression b/l. Lower extremity skin temperature gradient within normal limits. No edema noted b/l LE. No cyanosis or clubbing noted b/l LE.Marland Kitchen  Dermatological Examination: Pedal skin with normal turgor, texture and tone b/l. No open wounds. No interdigital macerations b/l. Toenails 1-5 b/l thickened, discolored, dystrophic with subungual debris. There is pain on palpation to dorsal aspect of nailplates. No hyperkeratotic nor porokeratotic lesions present on today's visit.Marland Kitchen  Neurological Examination: Protective sensation intact with 10 gram monofilament b/l LE. Vibratory sensation intact b/l LE.   Musculoskeletal Examination: Muscle strength 5/5 to all LE muscle groups b/l. HAV with bunion bilaterally and hammertoes 2-5 b/l.  Assessment/Plan: 1. Pain due to onychomycosis of toenails of both feet   2. Controlled type 2 diabetes mellitus without complication, without long-term current use of insulin ([place])     -Consent given for treatment as described below: -Continue foot and shoe inspections  daily. Monitor blood glucose per PCP/Endocrinologist's recommendations. -Continue supportive shoe gear daily. 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 -Patient/POA to call should there be question/concern in the interim.   Return in about 3 months (around 09/27/2022).  Theresa Mendez, DPM

## 2022-07-01 ENCOUNTER — Encounter: Payer: Self-pay | Admitting: Podiatry

## 2022-08-31 IMAGING — CR DG CHEST 2V
2 series · 2 of 2 positions shown · non-contrast
Comparison: None.

CLINICAL DATA: Cough.

EXAM:
CHEST - 2 VIEW

[chest lat]
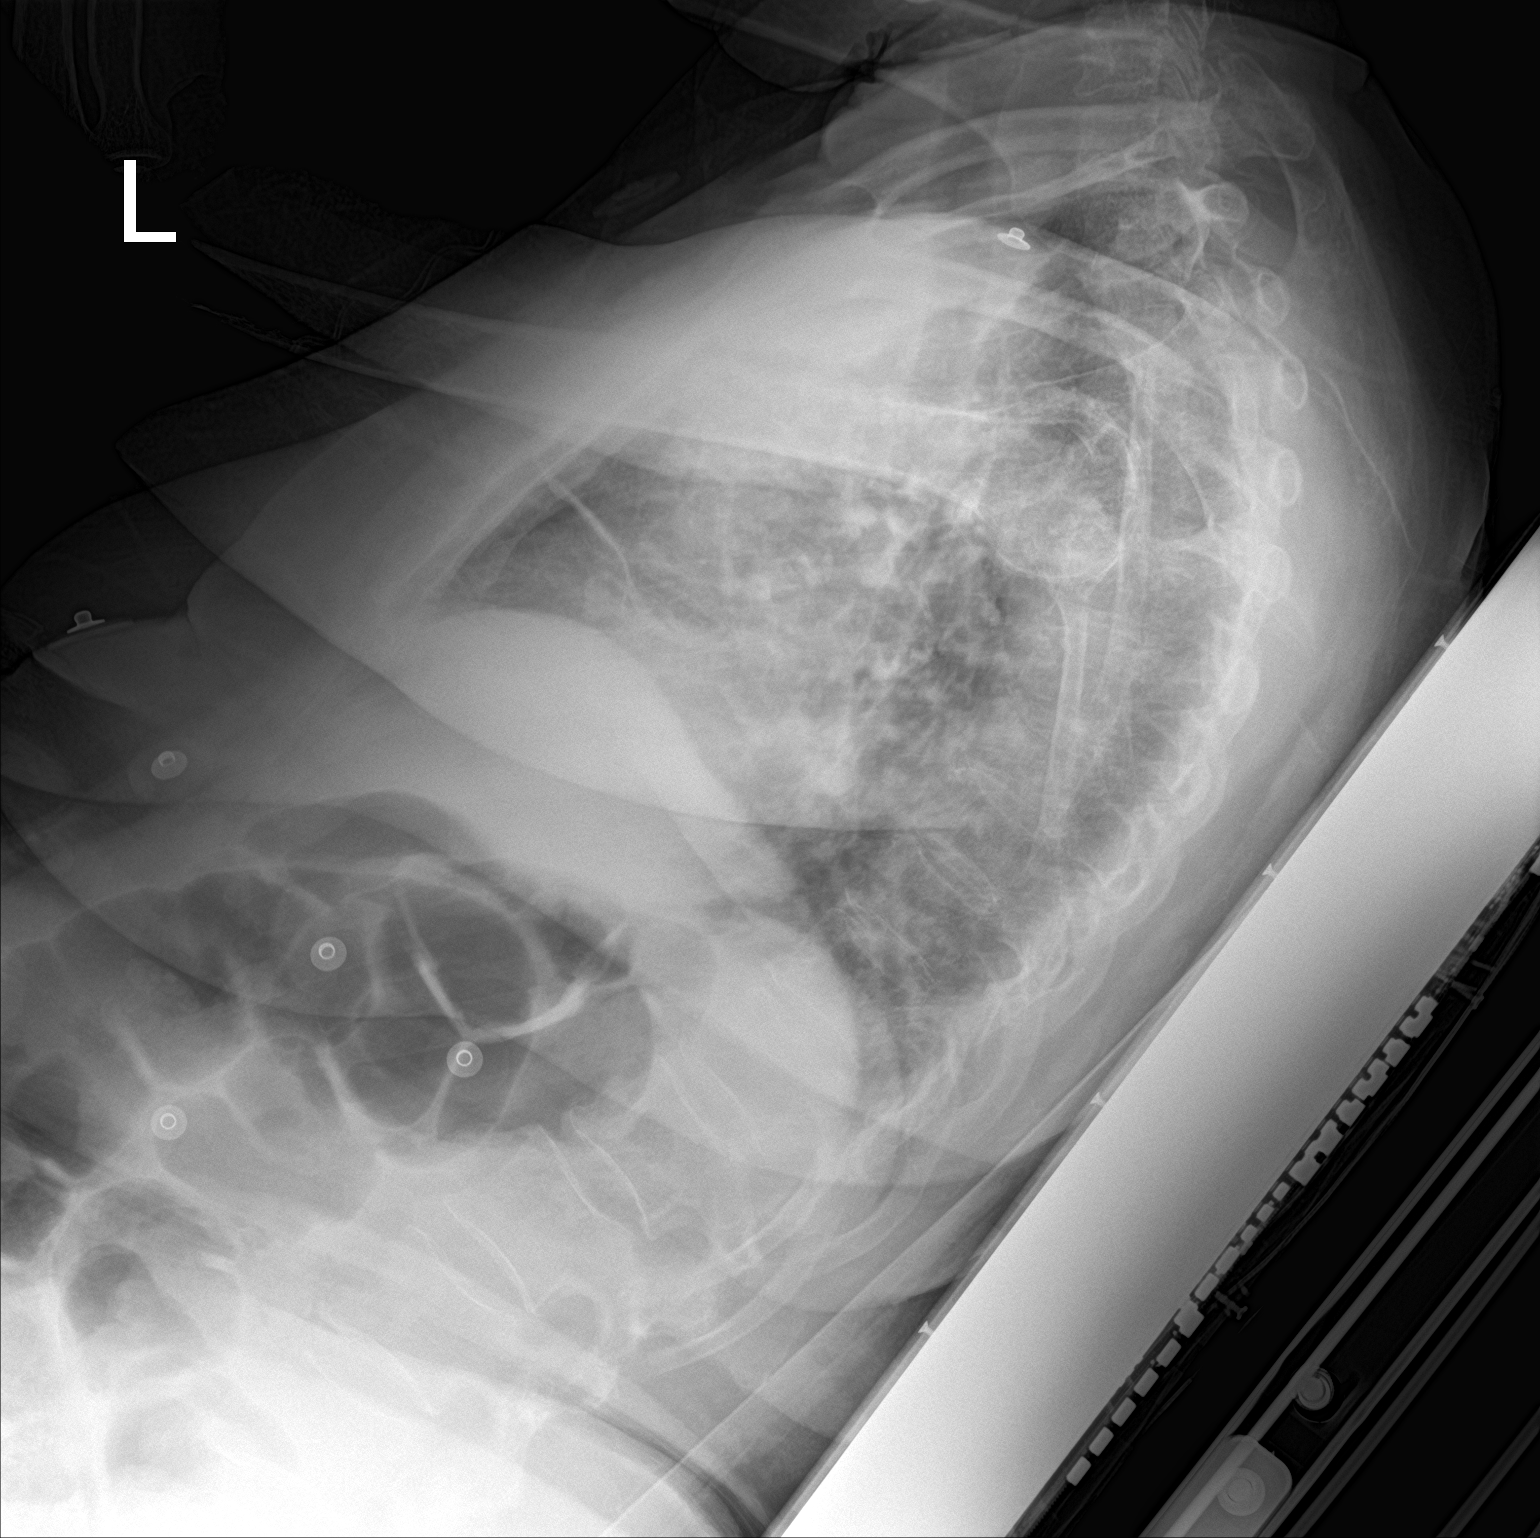

[chest ap]
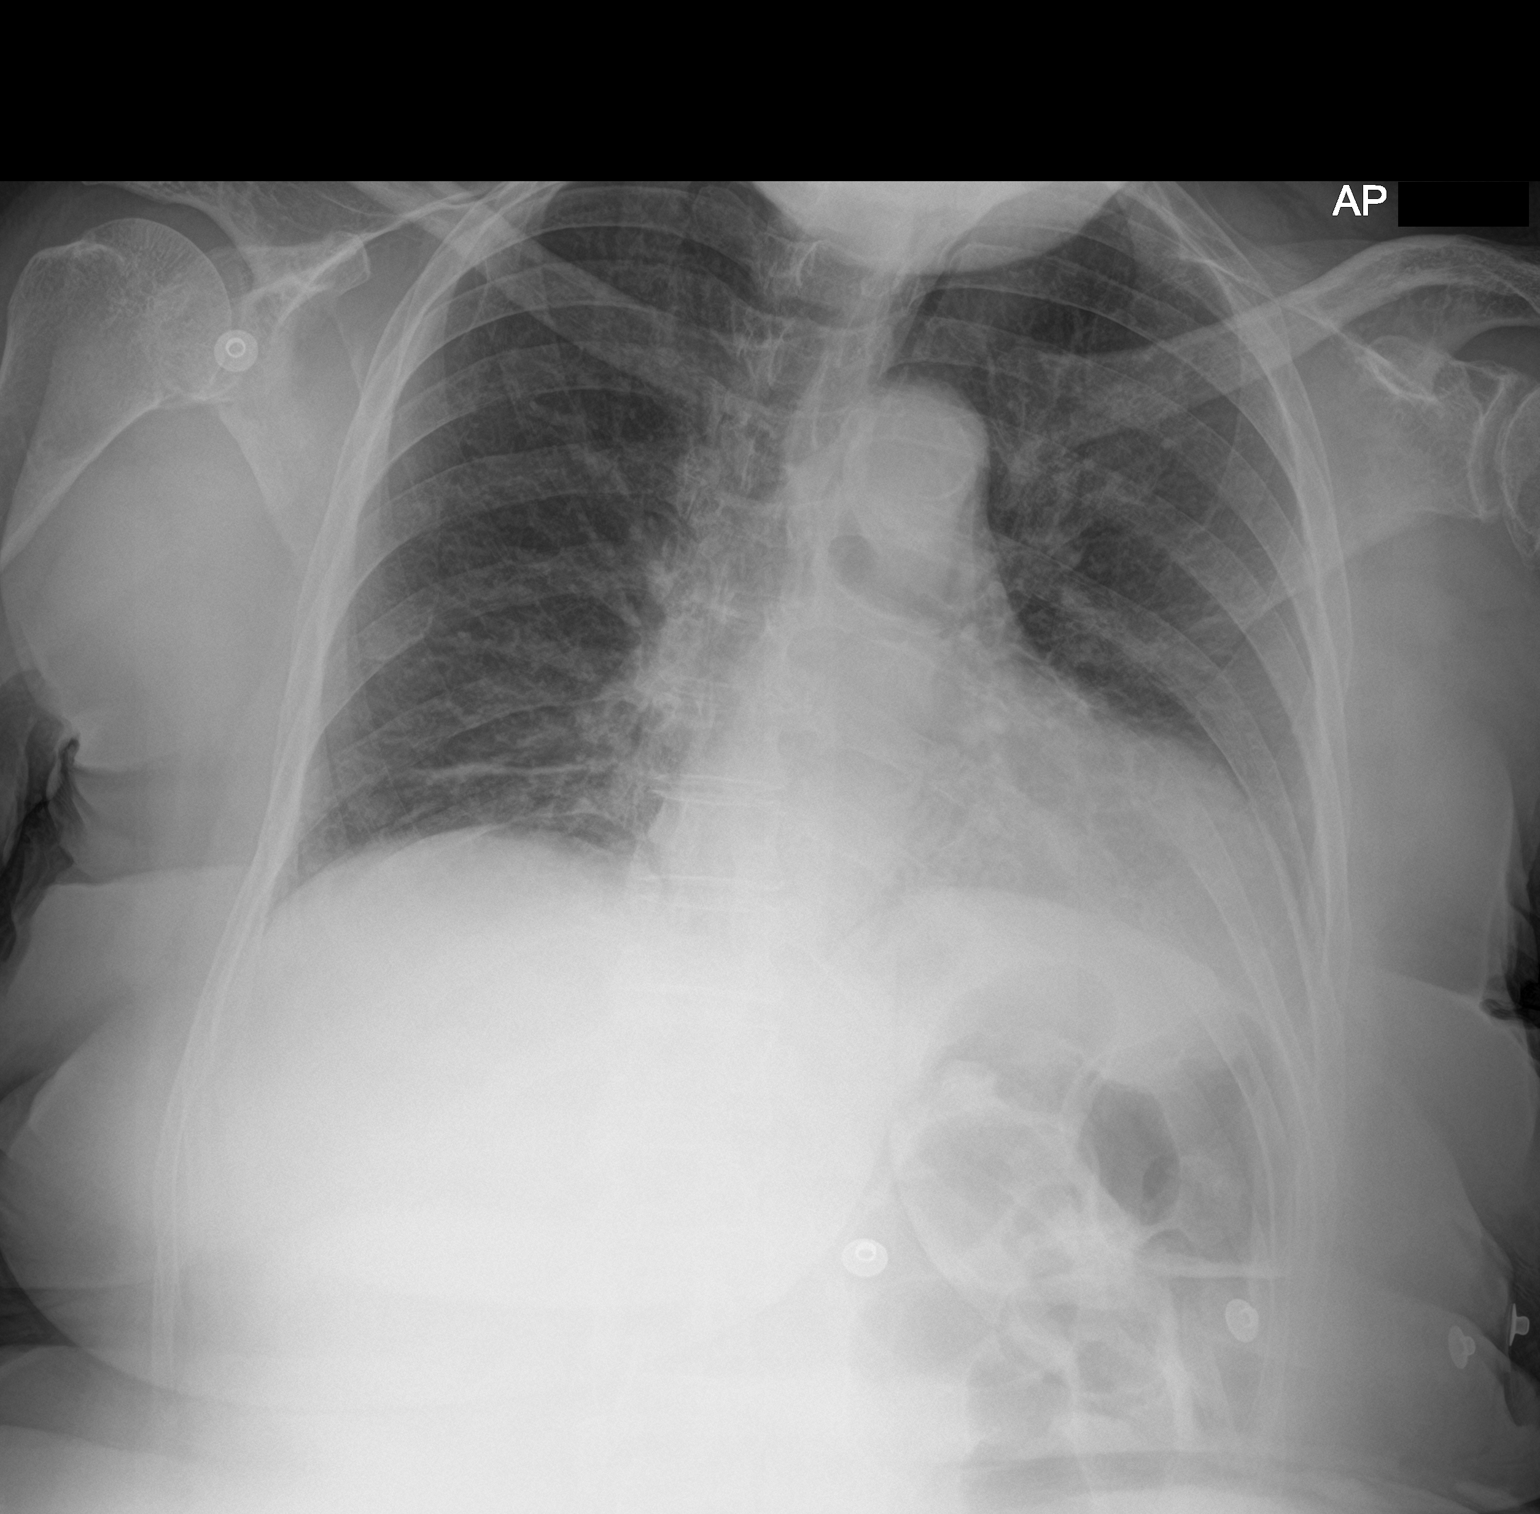

[2 of 2 positions shown; findings below may reference images not displayed]

FINDINGS: Mild, diffuse, chronic appearing increased interstitial lung
markings are seen. Mild linear atelectasis is noted within the right
lung base. There is no evidence of a pleural effusion or
pneumothorax. The cardiac silhouette is mildly enlarged. There is
mild calcification of the aortic arch. The visualized skeletal
structures are unremarkable.
IMPRESSION: Chronic appearing increased interstitial lung markings with mild
right basilar linear atelectasis.

## 2022-10-04 ENCOUNTER — Ambulatory Visit (INDEPENDENT_AMBULATORY_CARE_PROVIDER_SITE_OTHER): Payer: Medicare PPO | Admitting: Podiatry

## 2022-10-04 DIAGNOSIS — Z91199 Patient's noncompliance with other medical treatment and regimen due to unspecified reason: Secondary | ICD-10-CM

## 2022-10-04 NOTE — Progress Notes (Signed)
1. No-show for appointment     

## 2022-11-03 ENCOUNTER — Ambulatory Visit (INDEPENDENT_AMBULATORY_CARE_PROVIDER_SITE_OTHER): Payer: Medicare PPO | Admitting: Physician Assistant

## 2022-11-03 ENCOUNTER — Encounter: Payer: Self-pay | Admitting: Physician Assistant

## 2022-11-03 VITALS — BP 120/60 | Resp 18 | Ht 61.0 in | Wt 155.0 lb

## 2022-11-03 DIAGNOSIS — G301 Alzheimer's disease with late onset: Secondary | ICD-10-CM

## 2022-11-03 DIAGNOSIS — F02818 Dementia in other diseases classified elsewhere, unspecified severity, with other behavioral disturbance: Secondary | ICD-10-CM

## 2022-11-03 NOTE — Patient Instructions (Signed)
1.Continue Memantine  10 mg 2 times a day 3. Continue  the mood medications   5.Continue using a walker and the cane  6 Follow-up in 6 months, call for any changes     FALL PRECAUTIONS: Be cautious when walking. Scan the area for obstacles that may increase the risk of trips and falls. When getting up in the mornings, sit up at the edge of the bed for a few minutes before getting out of bed. Consider elevating the bed at the head end to avoid drop of blood pressure when getting up. Walk always in a well-lit room (use night lights in the walls). Avoid area rugs or power cords from appliances in the middle of the walkways. Use a walker or a cane if necessary and consider physical therapy for balance exercise. Get your eyesight checked regularly.   HOME SAFETY: Consider the safety of the kitchen when operating appliances like stoves, microwave oven, and blender. Consider having supervision and share cooking responsibilities until no longer able to participate in those. Accidents with firearms and other hazards in the house should be identified and addressed as well.   ABILITY TO BE LEFT ALONE: If patient is unable to contact 911 operator, consider using LifeLine, or when the need is there, arrange for someone to stay with patients. Smoking is a fire hazard, consider supervision or cessation. Risk of wandering should be assessed by caregiver and if detected at any point, supervision and safe proof recommendations should be instituted.  RECOMMENDATIONS FOR ALL PATIENTS WITH MEMORY PROBLEMS: 1. Continue to exercise (Recommend 30 minutes of walking everyday, or 3 hours every week) 2. Increase social interactions - continue going to [place] and enjoy social gatherings with friends and family 3. Eat healthy, avoid fried foods and eat more fruits and vegetables 4. Maintain adequate blood pressure, blood sugar, and blood cholesterol level. Reducing the risk of stroke and cardiovascular disease also helps  promoting better memory. 5. Avoid stressful situations. Live a simple life and avoid aggravations. Organize your time and prepare for the next day in anticipation. 6. Sleep well, avoid any interruptions of sleep and avoid any distractions in the bedroom that may interfere with adequate sleep quality 7. Avoid sugar, avoid sweets as there is a strong link between excessive sugar intake, diabetes, and cognitive impairment The Mediterranean diet has been shown to help patients reduce the risk of progressive memory disorders and reduces cardiovascular risk. This includes eating fish, eat fruits and green leafy vegetables, nuts like almonds and hazelnuts, walnuts, and also use olive oil. Avoid fast foods and fried foods as much as possible. Avoid sweets and sugar as sugar use has been linked to worsening of memory function.  There is always a concern of gradual progression of memory problems. If this is the case, then we may need to adjust level of care according to patient needs. Support, both to the patient and caregiver, should then be put into place.

## 2022-11-03 NOTE — Progress Notes (Signed)
Assessment/Plan:   Dementia likely due to Alzheimer's Disease with Behavioral Disturbance  Theresa Mendez is a very pleasant 77 y.o. RH female with a history of dementia likely due to Alzheimer's disease with behavioral disturbance seen today in follow up for memory loss. Patient is currently on memantine 10 mg twice daily.  She is no longer on donepezil milligrams daily, "because the functional medicine doctor discontinued it ".  She is on bupropion and Cymbalta for mood as per PCP. Overall, patient's memory is stable. She does depend on some of her ADLs.      Recommendations :   Follow up in 6  months. Continue 24/7 supervision Continue memantine 10 mg twice daily, side effects discussed. Recommend good control of her cardiovascular risk factors Continue to control mood as per PCP     Subjective:    This patient is accompanied in the office by her sister who supplements the history.  Previous records as well as any outside records available were reviewed prior to todays visit. Patient was last seen on 04/21/2022.  Last MMSE on November 2022 was 11/30.   Any changes in memory since last visit? " About the same, I am still working "-( she is not working) . She has short attention and gets tired with some tasks, but able to perform simple ones, such as washing dishes  and cleaning, folding clothes which she enjoys.  She watches TV . She attends Adult day program, likes going there. repeats oneself?  Endorsed Disoriented when walking into a room?  She has to be redirected, because at times she does not recall where the rooms are.  She confuses the bathroom with the kitchen and wants to urinate in it. Leaving objects in unusual places?  She likes to collect paper towels, and then places them in drawers, folding them. She picks up other objects as well.  Wandering behavior?  denies   Any personality changes since last visit?  denies   Any worsening depression?:  denies   Hallucinations or  paranoia?  Endorsed she has some episodes of sundowning.  Then she has visual hallucinations, she may hear bells at times.  Seizures?  denies    Any sleep changes? Sleeps well. Occasional vivid dreams, REM behavior or sleepwalking   Sleep apnea?   denies   Any hygiene concerns?  As before, she does not like to shower but then, she says "I am invigorated " after taking them.  Her caregiver lies the clothing for her to wear, and needs assistance "otherwise she will put her clothing in layers or try to put both legs in the same side of the pants". Does the patient needs help with medications?  Caregiver is in charge  Who is in charge of the finances?  Sister is in charge    Any changes in appetite?  Sometimes she forgets to eat.  To make her drink water her sister has to give it to her "like a baby" Patient have trouble swallowing?  denies   Does the patient cook?  No.   Any headaches?   denies   Chronic back pain endorsed. Ambulates with difficulty?  Sometimes she may not be able to figure out her steps and entangled her legs while walking. She uses a walker stability.  She did PT in the past which was helpful.   Recent falls or head injuries? denies     Unilateral weakness, numbness or tingling Denies   Any tremors?  denies   Any  anosmia?  Patient denies   Any incontinence of urine?  Endorsed.  She wears diapers. Any bowel dysfunction?     denies      Patient lives with her sister and her roommate.   Does the patient drive?  No longer drives      History on Initial Assessment 02/27/2020: This is a 77 year old left-handed woman with a history of hypertension, hyperlipidemia, diabetes, dementia, presenting to establish care. She was previously living in [place] and moved to [facility] to be closer to her sister and POA. She states her memory is not good. Records were reviewed. Records indicate that memory changes started in 2014, she tended to be repetitive.  Prior Neuropsychological testing in  2014 and 2016 indicated an amnestic profile suggestive of Alzheimer's disease. MRI brain in 2014 showed significant frontal, parietal, and temporal atrophy. No significant vascular changes. MOCA score 15/30 in 08/2017 on her last visit with her neurologist. She is on Donepezil 10mg  daily and Memantine ER 14mg  daily without side effects. She states her husband is still living in their house (he is deceased). 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 Medications are administered at [facility]. She asks "where is [employer]?" Her older sister with dementia also resides there. Her POA manages finances. She does not drive. She states she is independent with dressing and bathing, Theresa Mendez reminds her that she is assisted with this. She goes to the dining hall for meals. She states sleep is good. She denies any headaches, dizziness, diplopia, dysarthria/dysphagia, focal numbness/tingling/weakness, bowel/bladder dysfunction, anosmia, or tremors. She has chronic back pain and is doing physical therapy, although she does not recall this and her sister reminds her she has it several times a week. No personality changes, paranoia. She was previously on lorazepam but has not needed it. Sometimes she has mentioned seeing snakes, but not regularly. There is a strong family history of dementia in her mother, maternal grandmother, older sister. She does not drink alcohol.    PREVIOUS MEDICATIONS:   CURRENT MEDICATIONS:  Outpatient Encounter Medications as of 11/03/2022  Medication Sig   acetaminophen (TYLENOL) 325 MG tablet Take 650 mg by mouth every 6 (six) hours as needed for moderate pain.    amLODipine (NORVASC) 5 MG tablet amlodipine 5 mg tablet  TAKE 1 TABLET BY MOUTH EVERY DAY   buPROPion (WELLBUTRIN) 75 MG tablet Take by mouth.   clobetasol ointment (TEMOVATE) 0.05 % clobetasol 0.05 % topical  ointment  PLEASE SEE ATTACHED FOR DETAILED DIRECTIONS   DULoxetine (CYMBALTA) 30 MG capsule duloxetine 30 mg capsule,delayed release  TAKE ONE CAPSULE BY MOUTH EVERY DAY   ezetimibe (ZETIA) 10 MG tablet ezetimibe 10 mg tablet  TAKE 1 TABLET BY MOUTH EVERY DAY FOR CHOLESTEROL   hydrALAZINE (APRESOLINE) 100 MG tablet Take 100 mg by mouth 3 (three) times daily.   levocetirizine (XYZAL) 5 MG tablet levocetirizine 5 mg tablet  TAKE 1 TABLET BY MOUTH EVERY DAY   lisinopril (PRINIVIL,ZESTRIL) 40 MG tablet Take 40 mg by mouth daily.   memantine (NAMENDA) 10 MG tablet memantine 10 mg tablet  Take 1 tablet twice a day by oral route.   metFORMIN (GLUCOPHAGE) 500 MG tablet metformin 500 mg tablet  Take 1 tablet twice a day by oral route.   metoprolol tartrate (LOPRESSOR) 100 MG tablet metoprolol tartrate 100 mg tablet  Take 1 tablet every day by  oral route.   naproxen sodium (ANAPROX) 550 MG tablet Take 500 mg by mouth 2 (two) times daily.   sitaGLIPtin (JANUVIA) 100 MG tablet Januvia 100 mg tablet  Take 1 tablet every day by oral route.   spironolactone (ALDACTONE) 25 MG tablet Take 0.5 tablets by mouth daily.   No facility-administered encounter medications on file as of 11/03/2022.       04/15/2021   12:00 PM  MMSE - Mini Mental State Exam  Orientation to time 0  Orientation to Place 0  Registration 3  Attention/ Calculation 0  Recall 0  Language- name 2 objects 2  Language- repeat 1  Language- follow 3 step command 3  Language- read & follow direction 1  Write a sentence 1  Copy design 0  Total score 11       No data to display          Objective:     PHYSICAL EXAMINATION:    VITALS:   Vitals:   11/03/22 1448  BP: 120/60  Resp: 18  Weight: 155 lb (70.3 kg)  Height: 5\' 1"  (1.549 m)    GEN:  The patient appears stated age and is in NAD. HEENT:  Normocephalic, atraumatic.   Neurological examination:  General: NAD, well-groomed, appears stated age. Orientation:  The patient is alert. Oriented to person, not to place or date Cranial nerves: There is good facial symmetry.The speech is fluent and clear. No aphasia or dysarthria. Fund of knowledge is reduced.  Recent and remote memory are impaired. Attention and concentration are reduced.  Able to name objects and  unable to repeat phrases.  Hearing is intact to conversational tone.   Sensation: Sensation is intact to light touch throughout Motor: Strength is at least antigravity x4. DTR's 2/4 in UE/LE     Movement examination: Tone: There is normal tone in the UE/LE Abnormal movements:  no tremor.  No myoclonus.  No asterixis.   Coordination:  There is no decremation with RAM's. Normal finger to nose  Gait and Station: The patient has no difficulty arising out of a deep-seated chair without the use of the hands. The patient's stride length is good.  Gait is cautious and narrow.    Thank you for allowing [place] the opportunity to participate in the care of this nice patient. Please do not hesitate to contact us for any questions or concerns.   Total time spent on today's visit was 20 minutes dedicated to this patient today, preparing to see patient, examining the patient, ordering tests and/or medications and counseling the patient, documenting clinical information in the EHR or other health record, independently interpreting results and communicating results to the patient/family, discussing treatment and goals, answering patient's questions and coordinating care.  Cc:  Annita Brod, MD  Marlowe Kays 11/03/2022 3:09 PM

## 2022-11-09 NOTE — Progress Notes (Signed)
 Chief Complaint:  Chief Complaint  Patient presents with  . Back Pain  . Buttocks Pain    Impression: 1. Lumbar foraminal stenosis      2. Other spondylosis with radiculopathy, lumbar region      3. Lumbar degenerative disc disease      4. Spinal stenosis, lumbar region without neurogenic claudication          Plan:    1. Injections: Plan for caudal ESI in C arm.  Patient is not on any blood thinners.  2. Adjuvant Medications: Continue medications as prescribed including Cymbalta per primary care.  minimize adjunctive medication as the patient and her sister are afraid of side effects affecting her already significant memory issues.  Specialized treatments: finished PT  3. Opioid medications: None prescribed. would use caution as patient has been diagnosed with Alzheimer's and struggles with memory  4. Follow up: Follow up for caudal ESI with Dr. Rosella.  5. The patient's blood pressure was 103/68    History of Present Illness:  Theresa Mendez is a 77 y.o.year old female with a history of Bilateral low back pain with some radiation through lateral legs.  Patient does have significant medical history of Alzheimer's, CVA. Theresa Mendez returns to the clinic today having last been seen on 09/22/2022.  At this time, the patient continued on a regimen of the following medication(s):  Current Home Medications   Medication Sig  amLODIPine  besylate (NORVASC ) 2.5 mg tablet Take one tablet (2.5 mg dose) by mouth daily.  buPROPion  (WELLBUTRIN ) 75 mg tablet Take one tablet (75 mg dose) by mouth 2 (two) times daily.  DULoxetine HCl (CYMBALTA) 30 mg capsule TAKE ONE CAPSULE (30 MG DOSE) BY MOUTH DAILY.  hydrALAZINE HCl (APRESOLINE) 100 mg tablet Take one tablet (100 mg dose) by mouth 3 (three) times a day.  lisinopril  (PRINIVIL ,ZESTRIL ) 40 mg tablet Take one tablet (40 mg dose) by mouth daily.  memantine  (NAMENDA ) 5 mg tablet Take one tablet (5 mg dose) by mouth 2 (two) times daily. Take 7 mg  daily  metoprolol  tartrate (LOPRESSOR ) 100 mg tablet TAKE ONE TABLET 2 TIMES DAILY. CONTACT DR. WINN ([place]) TO GET MAIL SCRIPT/REFILLS.  metoprolol  tartrate (LOPRESSOR ) 100 mg tablet Take one tablet (100 mg dose) by mouth 2 (two) times daily. This is one month supply. Please contact Dr. WINN office in [place] to get your mail script/refills.  potassium chloride (K+8,KLOR CON) 8 mEq CR tablet Take one tablet (8 mEq dose) by mouth daily.  sitaGLIPtin (JANUVIA) 100 mg tablet Take by mouth.  spironolactone (ALDACTONE) 100 MG tablet Take 25 mg by mouth daily.    Since having last been seen, Theresa Mendez' s pain seems to have improved some. She is not able to provide a pain percentage of relief or score her pain scale/ Her sister accompanies her today and does feel in the gaps of her history secondary to patient's Alzheimer's. Her sister measures her pain at how well the patient ambulates and complaints, and recently notes that the patient has been standing and walking better without complaining as much.  The patient notes low back pain today with just sitting, pointing to her left lower back and left leg.   Improvement from treatments: See below under procedures  Side effects from medicines: N/A Activity Level- _adequate Abberant Behavior- _nil  Procedures: 03/04/2020 bilateral L4-5 TFESI-patient states no improvement but has no more radicular symptoms in office today. 07/22/2021 -bilateral L4-5 TFESI - family verbalized that patient is not complaining of pain as  much, and is walking much better. Denies radicular sxs today 01/26/2022 -bilateral L4-5 TFESI - family verbalizes that she is not complaining when standing and walking   Images: Results for orders placed in visit on 12/31/19  MRI Spine  Lumbar WO IV Contrast  Narrative INDICATION: Back pain or radiculopathy, > 6 wks  COMPARISON: Plain films of the lumbar spine same date. Previous MR lumbar spine  02/20/2018.  TECHNIQUE: MRI SPINE LUMBAR WO IV CONTRAST.  FINDINGS: #  Impression Osseous structures: Vertebral body heights are maintained. No acute fracture. No pars defect appreciated. No destructive bony changes. #  Alignment:Similar grade 1 anterolisthesis L4-5 measuring approximately 5 mm. #  Conus medullaris/cauda equina: Conus appears normal and terminates at T12-L1.  #  Lower thoracic spine: No significant spinal canal or foraminal stenosis.  #  T12-L1: Unchanged. Preservation of disc height. Small shallow central disc protrusion indents the ventral thecal sac. No significant spinal canal stenosis. No impingement of the distal spinal cord/conus or traversing nerve roots. No significant foraminal stenosis. #  L1-L2: Unchanged. Preservation of disc height. Minimal disc bulge and marginal spurring. No focal disc herniation or significant stenosis. #  L2-L3: Unchanged. Preservation of disc height. Minimal disc bulge and marginal spurring. Mild facet arthrosis. No focal disc herniation or significant stenosis. #  L3-L4: Unchanged. Preservation of disc height. Mild disc bulge and marginal spurring. Moderate facet arthrosis. Minimal right facet effusion. Thickening of the ligamentum flavum. Mild spinal canal stenosis. No focal disc herniation or impingement of the traversing nerve roots. No significant foraminal stenosis. #  L4-L5: Unchanged. There is again mild loss of disc height. Grade 1 anterolisthesis. Generalized disc bulge. Severe facet arthrosis with hypertrophic changes and small effusions. Thickening of the ligamentum flavum. Severe spinal canal/lateral recess stenosis. Moderate bilateral foraminal stenosis, worse on the left. 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 #  L5-S1: Unchanged. Preservation of disc height. Mild disc bulge and marginal spurring. Mild facet arthrosis. No focal disc herniation or significant  stenosis.  #  Paraspinal tissues: Unremarkable  #  Contrast: None given.  #  Additional comments: None.   IMPRESSION: 1.  Similar multilevel DDD and facet arthrosis.Theresa Mendez FINDINGS again most prominent at L4-5 with there is grade 1 degenerative spondylolisthesis, generalized disc bulge and posterior hypertrophic changes resulting in severe spinal canal/lateral recess stenosis as well as moderate bilateral foraminal stenosis. Mild spinal canal stenosis L3-4. Spinal canal and neural foramina otherwise widely patent.. 2.  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 3.  No acute fracture.  Electronically Signed by: Elsie Dayhoff   Narcotic Violations: N/A   Pill count: N/A  Last RUDS N/A   Past Medical History:  History reviewed. No pertinent past medical history.  Psychiatric History:  positive for fatigue and Memory issues  Past Surgical History:  History reviewed. No pertinent surgical history.  Allergies:  Allergies  Allergen Reactions  . Simvastatin Other  . Clopidogrel Other    Family stated she had a reaction     Medications History:  No outpatient medications have been marked as taking for the 11/09/22 encounter (Office Visit) with Kao Ly, NP.    Social History:  Social History   Socioeconomic History  . Marital status: Married  Tobacco Use  . Smoking status: Former    Packs/day: .25    Types: Cigarettes    Quit date: 06/13/2006    Years since quitting: 16.4  .  Smokeless tobacco: Never  Vaping Use  . Vaping Use: Never used  Substance and Sexual Activity  . Alcohol use: Never  . Drug use: Never    Family History:  Family History  Problem Relation Age of Onset  . Stroke Paternal Grandfather     Review of Systems:    Review of Systems  Musculoskeletal:  Positive for arthralgias, back pain and gait problem.   Denies new numbness or weakness     Denies current chest pain, SOB,  Oversedation     Physical Exam:   BP: 103/68 Resp:      Pulse: 68        SPO2:     General:  Alert and oriented, No acute distress.   Eye:  Pupils are equal, round and reactive to light, Normal conjunctiva.   Respiratory:  Respirations are non-labored, Symmetrical chest wall expansion.   Cardiovascular:  Normal rate, Normal peripheral perfusion.   Integumentary:  Warm, Dry, Pink, No rash.     Cognition and Speech:  Oriented, Speech clear and coherent.   Psychiatric:  Cooperative, Appropriate mood & affect, Normal judgment.       Back:  Facet loading maneuvers are positive bilaterally    Neuro: muscle tone and strength normal and symmetric, sensation grossly normal and can stand without assistance, stiff-legged antalgic walk with assistance.   Patient's pain was assessed, documented as positive, unless stated in the HPI, and a follow up plan has been documented in the Plan. Patient's medication list was reviewed and updated if applicable.  Body Mass Index (BMI) screening was documented and if the patient's BMI was greater than 25 or less than 18.5, the patient was asked to follow up with their PCP for a full assessment regarding weight management. If Fluoroscopy was used during a procedure, the radiation exposure time and number of images were documented within the record.   Kao Ly, NP 11/09/2022 / 12:37 PM

## 2023-07-17 ENCOUNTER — Ambulatory Visit: Payer: Medicare PPO | Admitting: Podiatry

## 2023-07-28 ENCOUNTER — Telehealth: Payer: Self-pay | Admitting: Physician Assistant

## 2023-07-28 NOTE — Telephone Encounter (Signed)
No answer, unsure whom referred to that physician.

## 2023-07-28 NOTE — Telephone Encounter (Signed)
Pt's sister called in and left a message. She is checking on a referral for the pt to Dr. Neldon Labella.

## 2023-07-28 NOTE — Telephone Encounter (Signed)
I just spoke with Theresa Mendez. She was confused and got mixed up. Disregard the last note.

## 2023-08-08 ENCOUNTER — Ambulatory Visit: Payer: Medicare PPO | Admitting: Podiatry

## 2023-08-18 NOTE — Progress Notes (Signed)
 Chief Complaint:  Chief Complaint  Patient presents with  . Follow-up    Impression: 1. Lumbar foraminal stenosis      2. Other spondylosis with radiculopathy, lumbar region         Plan:    1. Injections: Plan for caudal ESI in C arm.  Patient is not on any blood thinners.  2. Adjuvant Medications: Continue medications as prescribed including Cymbalta per primary care.  minimize adjunctive medication as the patient and her sister are afraid of side effects affecting her already significant memory issues.  Specialized treatments: finished PT  3. Opioid medications: None prescribed. would use caution as patient has been diagnosed with Alzheimer's and struggles with memory  4. Follow up: Follow up for caudal ESI with Dr. Rosella.  5. The patient's blood pressure was 108/68    History of Present Illness:  Theresa Mendez is a 78 y.o.year old female with a history of Bilateral low back pain with some radiation through lateral legs.  Patient does have significant medical history of Alzheimer's, CVA.   Interval history 08/18/2023  Theresa Mendez returns to the clinic today having last been seen on 11/09/22. Since having last been seen, [name]' s pain seems to have increased. She notes back pain with radiation into her butt, right more than left, but shooting pain down her right leg. Her sister accompanies her today and does feel in the gaps of her history secondary to patient's Alzheimer's. Her sister measures her pain at how well the patient ambulates and complaints, and recently notes that the patient has not been able to stand or walk as great.  Improvement from treatments: See below under procedures  Side effects from medicines: N/A Activity Level- _adequate Abberant Behavior- _nil  Procedures: 03/04/2020 bilateral L4-5 TFESI-patient states no improvement but has no more radicular symptoms in office today. 07/22/2021 -bilateral L4-5 TFESI - family verbalized that patient is not  complaining of pain as much, and is walking much better. Denies radicular sxs today 01/26/2022 -bilateral L4-5 TFESI - family verbalizes that she is not complaining when standing and walking   Images: Results for orders placed in visit on 12/31/19  MRI Spine  Lumbar WO IV Contrast  Narrative INDICATION: Back pain or radiculopathy, > 6 wks  COMPARISON: Plain films of the lumbar spine same date. Previous MR lumbar spine 02/20/2018.  TECHNIQUE: MRI SPINE LUMBAR WO IV CONTRAST.  FINDINGS: #  Impression Osseous structures: Vertebral body heights are maintained. No acute fracture. No pars defect appreciated. No destructive bony changes. #  Alignment:Similar grade 1 anterolisthesis L4-5 measuring approximately 5 mm. #  Conus medullaris/cauda equina: Conus appears normal and terminates at T12-L1.  #  Lower thoracic spine: No significant spinal canal or foraminal stenosis.  #  T12-L1: Unchanged. Preservation of disc height. Small shallow central disc protrusion indents the ventral thecal sac. No significant spinal canal stenosis. No impingement of the distal spinal cord/conus or traversing nerve roots. No significant foraminal stenosis. #  L1-L2: Unchanged. Preservation of disc height. Minimal disc bulge and marginal spurring. No focal disc herniation or significant stenosis. #  L2-L3: Unchanged. Preservation of disc height. Minimal disc bulge and marginal spurring. Mild facet arthrosis. No focal disc herniation or significant stenosis. #  L3-L4: Unchanged. Preservation of disc height. Mild disc bulge and marginal spurring. Moderate facet arthrosis. Minimal right facet effusion. Thickening of the ligamentum flavum. Mild spinal canal stenosis. No focal disc herniation or impingement of the traversing nerve roots. No significant foraminal stenosis. #  L4-L5:  Unchanged. There is again mild loss of disc height. Grade 1 anterolisthesis. Generalized disc bulge. Severe facet arthrosis with hypertrophic  changes and small effusions. Thickening of the ligamentum flavum. Severe spinal canal/lateral recess stenosis. Moderate bilateral foraminal stenosis, worse on the left. 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 #  L5-S1: Unchanged. Preservation of disc height. Mild disc bulge and marginal spurring. Mild facet arthrosis. No focal disc herniation or significant stenosis.  #  Paraspinal tissues: Unremarkable  #  Contrast: None given.  #  Additional comments: None.   IMPRESSION: 1.  Similar multilevel DDD and facet arthrosis.[name] FINDINGS again most prominent at L4-5 with there is grade 1 degenerative spondylolisthesis, generalized disc bulge and posterior hypertrophic changes resulting in severe spinal canal/lateral recess stenosis as well as moderate bilateral foraminal stenosis. Mild spinal canal stenosis L3-4. Spinal canal and neural foramina otherwise widely patent.. 2.  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 3.  No acute fracture.  Electronically Signed by: Elsie Dayhoff   Narcotic Violations: N/A   Pill count: N/A  Last RUDS N/A   Past Medical History:  History reviewed. No pertinent past medical history.  Psychiatric History:  positive for fatigue and Memory issues  Past Surgical History:  History reviewed. No pertinent surgical history.  Allergies:  Allergies  Allergen Reactions  . Simvastatin Other  . Clopidogrel Other    Family stated she had a reaction     Medications History:  No outpatient medications have been marked as taking for the 08/18/23 encounter (Office Visit) with Kao Ly, NP.    Social History:  Social History   Socioeconomic History  . Marital status: Married  Tobacco Use  . Smoking status: Former    Current packs/day: 0.00    Types: Cigarettes    Quit date: 06/13/2006    Years since quitting: 17.1  . Smokeless tobacco:  Never  Vaping Use  . Vaping status: Never Used  Substance and Sexual Activity  . Alcohol use: Never  . Drug use: Never    Family History:  Family History  Problem Relation Age of Onset  . Stroke Paternal Grandfather     Review of Systems:    Review of Systems  Musculoskeletal:  Positive for arthralgias, back pain and gait problem.   Denies new numbness or weakness     Denies current chest pain, SOB, Oversedation     Physical Exam:   BP: 108/68 Resp:      Pulse: 66        SPO2:     General:  Alert and oriented, No acute distress.   Eye:  Pupils are equal, round and reactive to light, Normal conjunctiva.   Respiratory:  Respirations are non-labored, Symmetrical chest wall expansion.   Cardiovascular:  Normal rate, Normal peripheral perfusion.   Integumentary:  Warm, Dry, Pink, No rash.     Cognition and Speech:  Oriented, Speech clear and coherent.   Psychiatric:  Cooperative, Appropriate mood & affect, Normal judgment.       Back:  Facet loading maneuvers are positive bilaterally    Neuro: muscle tone and strength normal and symmetric, sensation grossly normal and can stand without assistance.   Patient's pain was assessed, documented as positive, unless stated in the HPI, and a follow up plan has been documented in the Plan. Patient's medication list was reviewed and updated if applicable.  Body Mass Index (BMI) screening was documented  and if the patient's BMI was greater than 25 or less than 18.5, the patient was asked to follow up with their PCP for a full assessment regarding weight management. If Fluoroscopy was used during a procedure, the radiation exposure time and number of images were documented within the record.   Kao Ly, NP 08/18/2023 / 11:34 AM

## 2023-09-13 ENCOUNTER — Ambulatory Visit: Payer: Medicare PPO | Admitting: Physician Assistant

## 2023-09-14 ENCOUNTER — Ambulatory Visit: Payer: Medicare PPO | Admitting: Physician Assistant

## 2023-10-20 ENCOUNTER — Ambulatory Visit (INDEPENDENT_AMBULATORY_CARE_PROVIDER_SITE_OTHER): Admitting: Physician Assistant

## 2023-10-20 ENCOUNTER — Encounter: Payer: Self-pay | Admitting: Physician Assistant

## 2023-10-20 VITALS — BP 138/74 | Resp 20 | Ht 61.0 in

## 2023-10-20 DIAGNOSIS — G301 Alzheimer's disease with late onset: Secondary | ICD-10-CM | POA: Diagnosis not present

## 2023-10-20 DIAGNOSIS — F02818 Dementia in other diseases classified elsewhere, unspecified severity, with other behavioral disturbance: Secondary | ICD-10-CM | POA: Diagnosis not present

## 2023-10-20 NOTE — Patient Instructions (Signed)
1.Continue Memantine  10 mg 2 times a day 3. Continue  the mood medications   5.Continue using a walker and the cane  6 Follow-up in 6 months, call for any changes     FALL PRECAUTIONS: Be cautious when walking. Scan the area for obstacles that may increase the risk of trips and falls. When getting up in the mornings, sit up at the edge of the bed for a few minutes before getting out of bed. Consider elevating the bed at the head end to avoid drop of blood pressure when getting up. Walk always in a well-lit room (use night lights in the walls). Avoid area rugs or power cords from appliances in the middle of the walkways. Use a walker or a cane if necessary and consider physical therapy for balance exercise. Get your eyesight checked regularly.   HOME SAFETY: Consider the safety of the kitchen when operating appliances like stoves, microwave oven, and blender. Consider having supervision and share cooking responsibilities until no longer able to participate in those. Accidents with firearms and other hazards in the house should be identified and addressed as well.   ABILITY TO BE LEFT ALONE: If patient is unable to contact 911 operator, consider using LifeLine, or when the need is there, arrange for someone to stay with patients. Smoking is a fire hazard, consider supervision or cessation. Risk of wandering should be assessed by caregiver and if detected at any point, supervision and safe proof recommendations should be instituted.  RECOMMENDATIONS FOR ALL PATIENTS WITH MEMORY PROBLEMS: 1. Continue to exercise (Recommend 30 minutes of walking everyday, or 3 hours every week) 2. Increase social interactions - continue going to [place] and enjoy social gatherings with friends and family 3. Eat healthy, avoid fried foods and eat more fruits and vegetables 4. Maintain adequate blood pressure, blood sugar, and blood cholesterol level. Reducing the risk of stroke and cardiovascular disease also helps  promoting better memory. 5. Avoid stressful situations. Live a simple life and avoid aggravations. Organize your time and prepare for the next day in anticipation. 6. Sleep well, avoid any interruptions of sleep and avoid any distractions in the bedroom that may interfere with adequate sleep quality 7. Avoid sugar, avoid sweets as there is a strong link between excessive sugar intake, diabetes, and cognitive impairment The Mediterranean diet has been shown to help patients reduce the risk of progressive memory disorders and reduces cardiovascular risk. This includes eating fish, eat fruits and green leafy vegetables, nuts like almonds and hazelnuts, walnuts, and also use olive oil. Avoid fast foods and fried foods as much as possible. Avoid sweets and sugar as sugar use has been linked to worsening of memory function.  There is always a concern of gradual progression of memory problems. If this is the case, then we may need to adjust level of care according to patient needs. Support, both to the patient and caregiver, should then be put into place.

## 2023-10-20 NOTE — Progress Notes (Signed)
 Assessment/Plan:   Dementia likely due to Alzheimer's disease with behavioral disturbance   Theresa Mendez is a very pleasant 78 y.o. RH female with a history of dementia likely due to Alzheimer's disease with behavioral disturbance seen today in follow up for memory loss. Patient is currently on memantine  10 mg twice daily, no longer on donepezil  because "the functional medicine doctor discontinued it ".  She is on bupropion  and Cymbalta for mood as per PCP.  Overall, her memory is stable.  She does depend on some ADLs, mood is good.    Follow up in  6 months. Continue memantine  10 mg twice daily, side effects discussed Continue 24/7 supervision. Recommend good control of her cardiovascular risk factors Continue to control mood as per PCP     Subjective:    This patient is accompanied in the office by her sister who supplements the history.  Previous records as well as any outside records available were reviewed prior to todays visit. Patient was last seen on 11/03/2022.  Last MMSE on November 2022 was 11/30.    Any changes in memory since last visit? "About the same". She has short attention, gets tired with some tasks, but able to perform simple ones, such as washing dishes and cleaning, folding clothes.  She likes to watch TV.  She attends adult day program, enjoys socializing. repeats oneself?  Endorsed Disoriented when walking into a room?  She has to be redirected, because at times she does not recall what the rooms are.  She confuses the bathroom with the kitchen and wants to urinate in it.  Leaving objects?  She likes to collect paper towels and then places them in drawers and fold.  She picks up other objects as well.  Wandering behavior?  Denies.   Any personality changes since last visit?  Denies.   Any worsening depression?:  Denies.   Hallucinations or paranoia? Denies  Seizures? denies    Any sleep changes?  Sleeps well, occasional vivid dreams, REM behavior or  sleepwalking   Sleep apnea?   Denies.   Any hygiene concerns?  As before, she does not like to shower, but her sister has a method to "lure her" to bathe.   Independent of bathing and dressing?  She needs assistance otherwise she may put layers of clothing Does the patient needs help with medications?.  Caregiver is in charge   Who is in charge of the finances?  Sister   is in charge     Any changes in appetite?  Sometimes she forgets to eat, drinks plenty of water Patient have trouble swallowing? Denies.   Does the patient cook? No Any headaches?   denies   Chronic back pain  denies   Ambulates with difficulty?  At times she is not able to figure out her steps, entangled in her legs while walking.  She uses a walker for stability.  Recent falls or head injuries? denies     Unilateral weakness, numbness or tingling? denies   Any tremors?  Denies   Any anosmia?  Denies   Any incontinence of urine?  Endorsed, wears diapers Any bowel dysfunction?   Denies      Patient lives with her sister and her niece  Does the patient drive? No longer drives      History on Initial Assessment 02/27/2020: This is a 78 year old left-handed woman with a history of hypertension, hyperlipidemia, diabetes, dementia, presenting to establish care. She was previously living in [place]  and moved to [facility] to be closer to her sister and POA. She states her memory is not good. Records were reviewed. Records indicate that memory changes started in 2014, she tended to be repetitive.  Prior Neuropsychological testing in 2014 and 2016 indicated an amnestic profile suggestive of Alzheimer's disease. MRI brain in 2014 showed significant frontal, parietal, and temporal atrophy. No significant vascular changes. MOCA score 15/30 in 08/2017 on her last visit with her neurologist. She is on Donepezil  10mg  daily and Memantine  ER 14mg  daily without side effects. She states her husband is still living in their house (he is deceased).  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 Medications are administered at [facility]. She asks "where is [employer]?" Her older sister with dementia also resides there. Her POA manages finances. She does not drive. She states she is independent with dressing and bathing, Wendel Hals reminds her that she is assisted with this. She goes to the dining hall for meals. She states sleep is good. She denies any headaches, dizziness, diplopia, dysarthria/dysphagia, focal numbness/tingling/weakness, bowel/bladder dysfunction, anosmia, or tremors. She has chronic back pain and is doing physical therapy, although she does not recall this and her sister reminds her she has it several times a week. No personality changes, paranoia. She was previously on lorazepam but has not needed it. Sometimes she has mentioned seeing snakes, but not regularly. There is a strong family history of dementia in her mother, maternal grandmother, older sister. She does not drink alcohol.     PREVIOUS MEDICATIONS:   CURRENT MEDICATIONS:  Outpatient Encounter Medications as of 10/20/2023  Medication Sig   acetaminophen  (TYLENOL ) 325 MG tablet Take 650 mg by mouth every 6 (six) hours as needed for moderate pain.    amLODipine  (NORVASC ) 5 MG tablet amlodipine  5 mg tablet  TAKE 1 TABLET BY MOUTH EVERY DAY   buPROPion  (WELLBUTRIN ) 75 MG tablet Take by mouth.   clobetasol ointment (TEMOVATE) 0.05 % clobetasol 0.05 % topical ointment  PLEASE SEE ATTACHED FOR DETAILED DIRECTIONS   DULoxetine (CYMBALTA) 30 MG capsule duloxetine 30 mg capsule,delayed release  TAKE ONE CAPSULE BY MOUTH EVERY DAY   ezetimibe (ZETIA) 10 MG tablet ezetimibe 10 mg tablet  TAKE 1 TABLET BY MOUTH EVERY DAY FOR CHOLESTEROL   hydrALAZINE (APRESOLINE) 100 MG tablet Take 100 mg by mouth 3 (three) times daily.   levocetirizine (XYZAL) 5 MG tablet  levocetirizine 5 mg tablet  TAKE 1 TABLET BY MOUTH EVERY DAY   lisinopril  (PRINIVIL ,ZESTRIL ) 40 MG tablet Take 40 mg by mouth daily.   memantine  (NAMENDA ) 10 MG tablet memantine  10 mg tablet  Take 1 tablet twice a day by oral route.   metFORMIN (GLUCOPHAGE) 500 MG tablet metformin 500 mg tablet  Take 1 tablet twice a day by oral route.   metoprolol  tartrate (LOPRESSOR ) 100 MG tablet metoprolol  tartrate 100 mg tablet  Take 1 tablet every day by oral route.   naproxen sodium (ANAPROX) 550 MG tablet Take 500 mg by mouth 2 (two) times daily.   sitaGLIPtin (JANUVIA) 100 MG tablet Januvia 100 mg tablet  Take 1 tablet every day by oral route.   spironolactone (ALDACTONE) 25 MG tablet Take 0.5 tablets by mouth daily.   No facility-administered encounter medications on file as of 10/20/2023.       04/15/2021   12:00 PM  MMSE - Mini Mental State Exam  Orientation to time 0  Orientation to Place 0  Registration 3  Attention/ Calculation 0  Recall 0  Language- name 2 objects 2  Language- repeat 1  Language- follow 3 step command 3  Language- read & follow direction 1  Write a sentence 1  Copy design 0  Total score 11       No data to display          Objective:     PHYSICAL EXAMINATION:    VITALS:   Vitals:   10/20/23 1531  BP: 138/74  Resp: 20  SpO2: 98%  Height: 5\' 1"  (1.549 m)    GEN:  The patient appears stated age and is in NAD. HEENT:  Normocephalic, atraumatic.   Neurological examination:  General: NAD, well-groomed, appears stated age. Orientation: The patient is alert.  Oriented to person, not to place and date Cranial nerves: There is good facial symmetry.The speech is fluent and clear. No aphasia or dysarthria. Fund of knowledge is reduced. Recent and remote memory are impaired. Attention and concentration are reduced.  Able to name objects and repeat phrases.  Hearing is intact to conversational tone.  Sensation: Sensation is intact to light touch  throughout Motor: Strength is at least antigravity x4. DTR's 2/4 in UE/LE     Movement examination: Tone: There is normal tone in the UE/LE Abnormal movements:  no tremor.  No myoclonus.  No asterixis.   Coordination:  There is no decremation with RAM's. Normal finger to nose  Gait and Station: The patient has some difficulty arising out of a deep-seated chair without the use of the hands. The patient's stride length is good.  Gait is cautious and narrow.    Thank you for allowing us  the opportunity to participate in the care of this nice patient. Please do not hesitate to contact us  for any questions or concerns.   Total time spent on today's visit was 32 minutes dedicated to this patient today, preparing to see patient, examining the patient, ordering tests and/or medications and counseling the patient, documenting clinical information in the EHR or other health record, independently interpreting results and communicating results to the patient/family, discussing treatment and goals, answering patient's questions and coordinating care.  Cc:  [facility]  Lakeshia Dohner 10/21/2023 8:46 PM

## 2024-01-04 IMAGING — US US CAROTID DUPLEX BILAT
1 series · 13 of 24 positions shown · non-contrast
Comparison: None.

CLINICAL DATA: Dizziness and giddiness. History of hypertension,
hyperlipidemia and diabetes.

EXAM:
BILATERAL CAROTID DUPLEX ULTRASOUND
TECHNIQUE: Gray scale imaging, color Doppler and duplex ultrasound were
performed of bilateral carotid and vertebral arteries in the neck.

[Series 1: us carotid duplex bilat · 0.06mm/px · 13 of 72 slices shown]
[im 1/72]
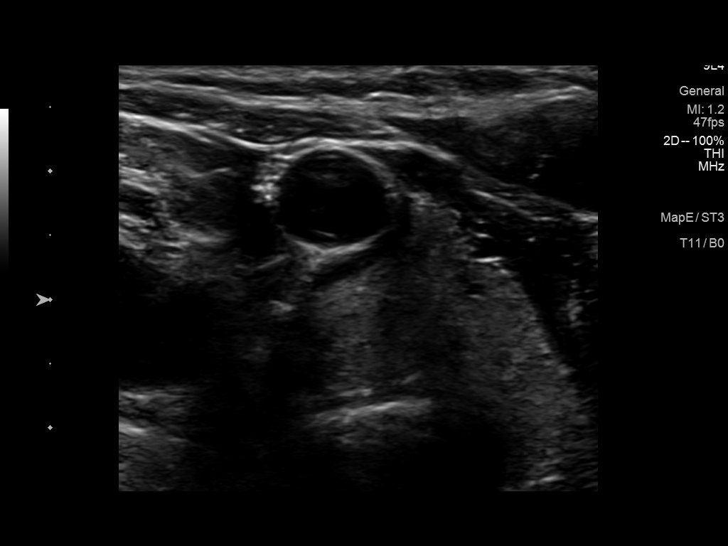
[im 7/72]
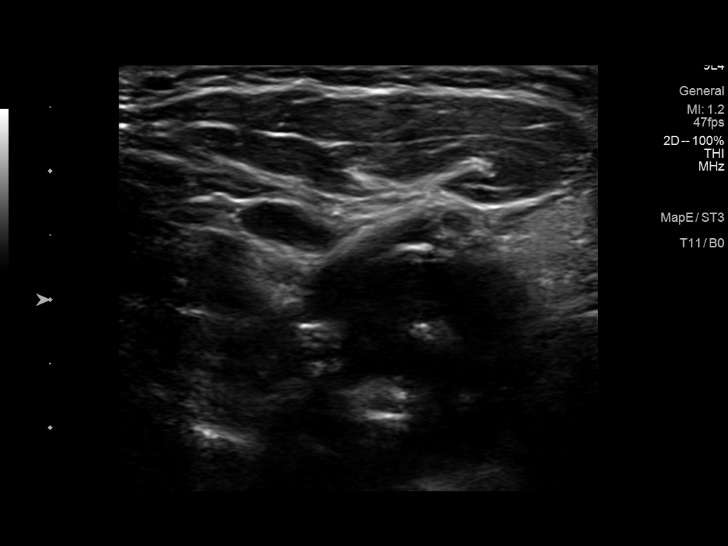
[im 13/72]
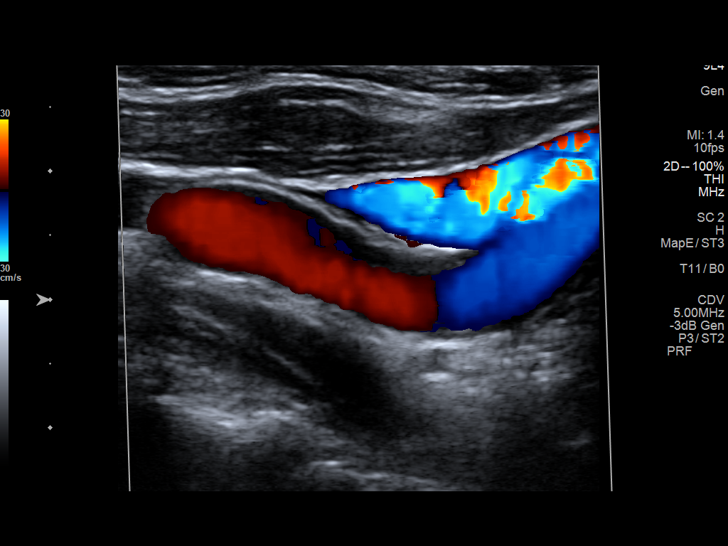
[im 19/72]
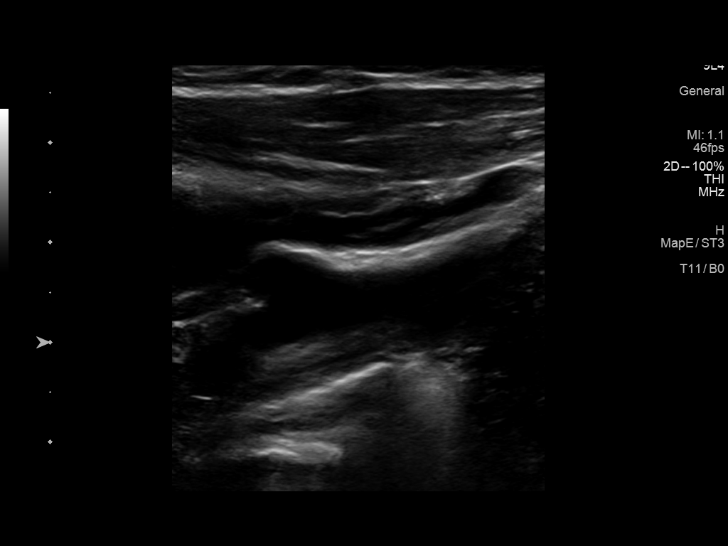
[im 25/72]
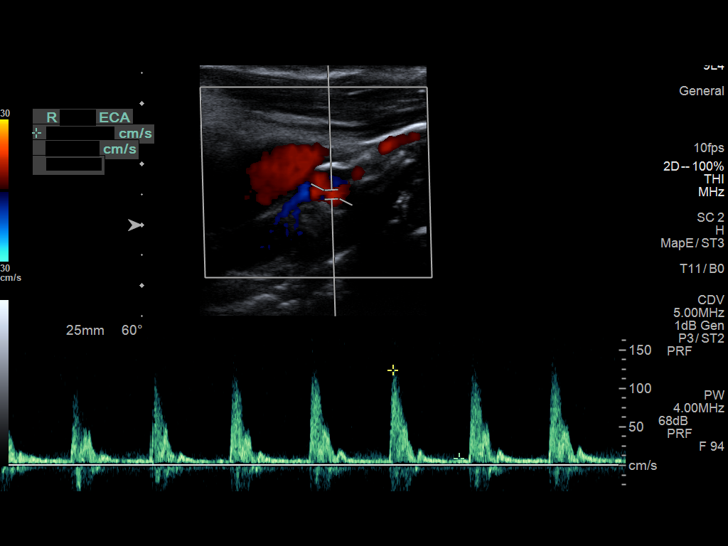
[im 31/72]
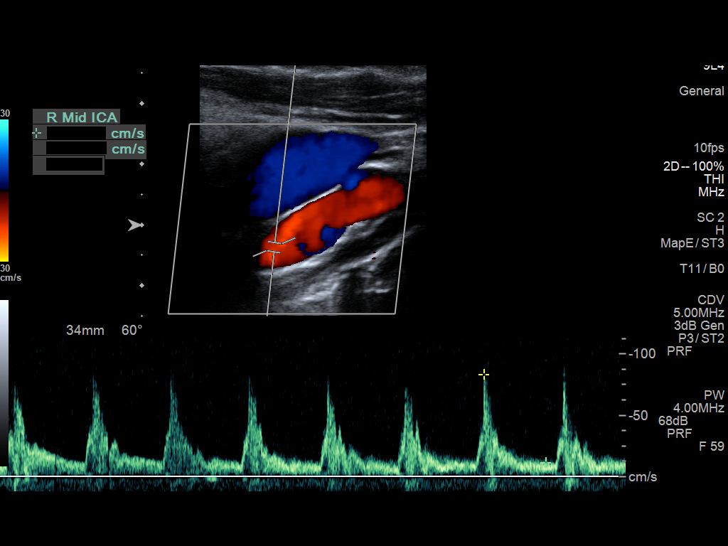
[im 38/72]
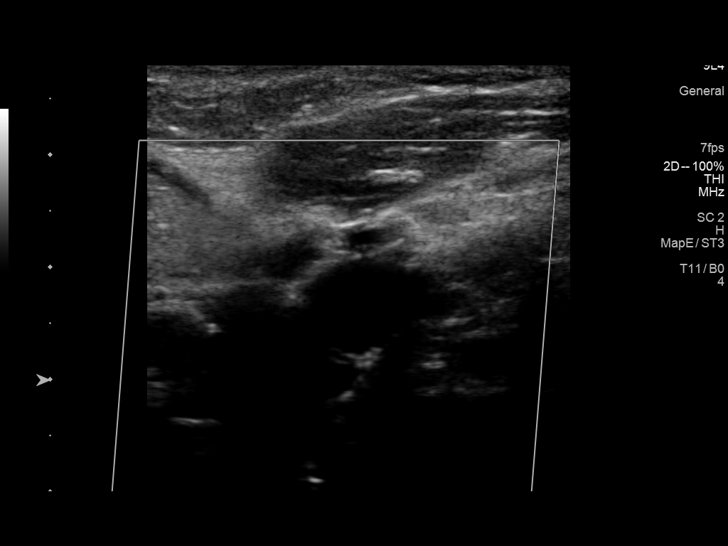
[im 41/72]
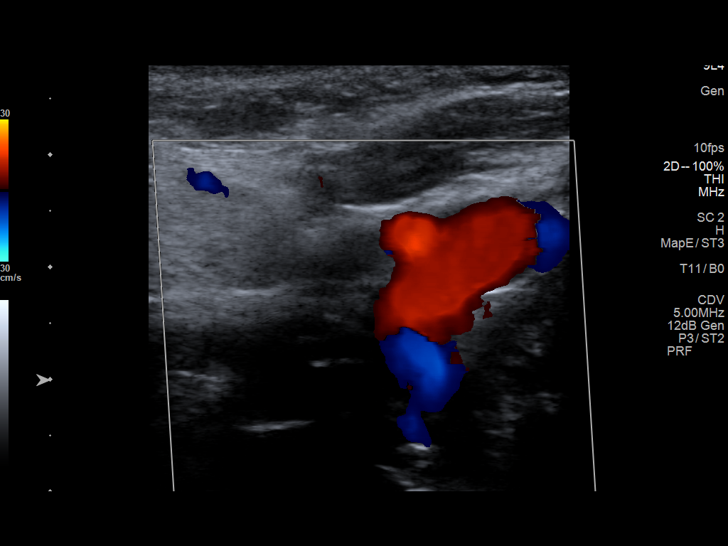
[im 47/72]
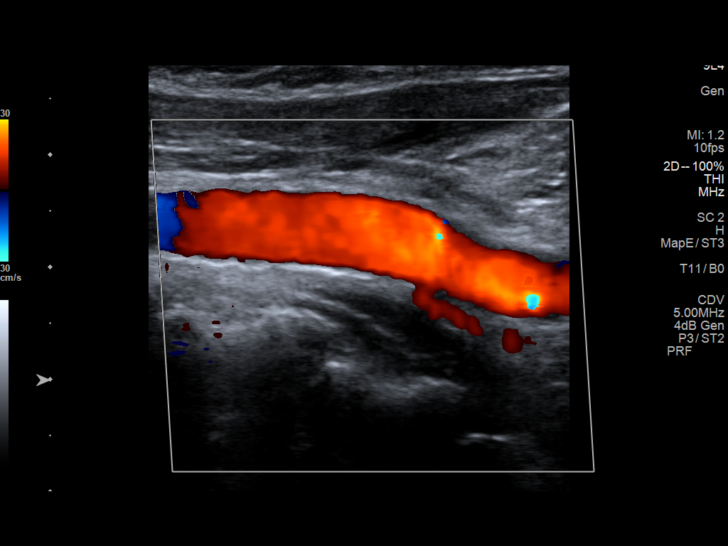
[im 53/72]
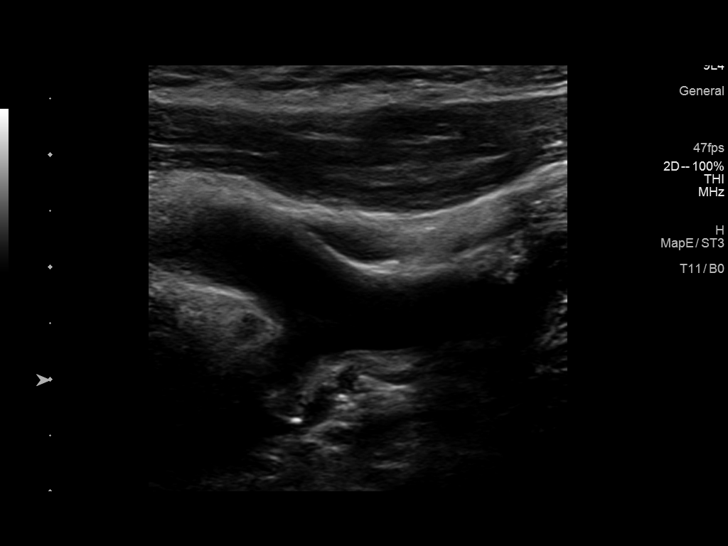
[im 59/72]
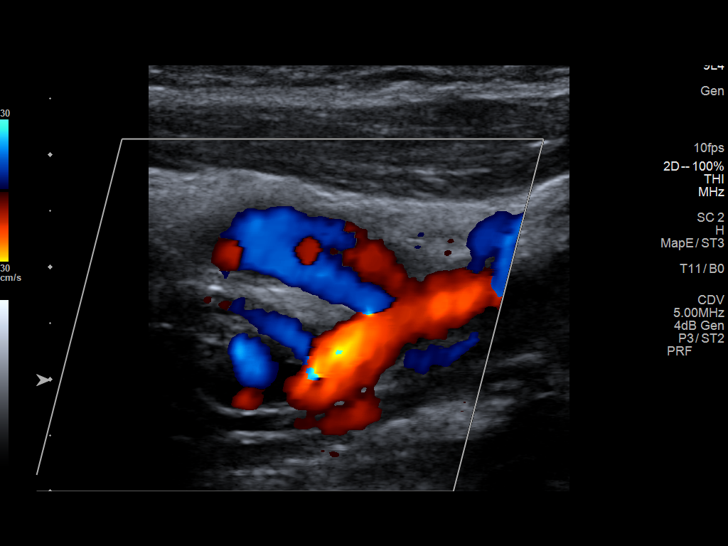
[im 65/72]
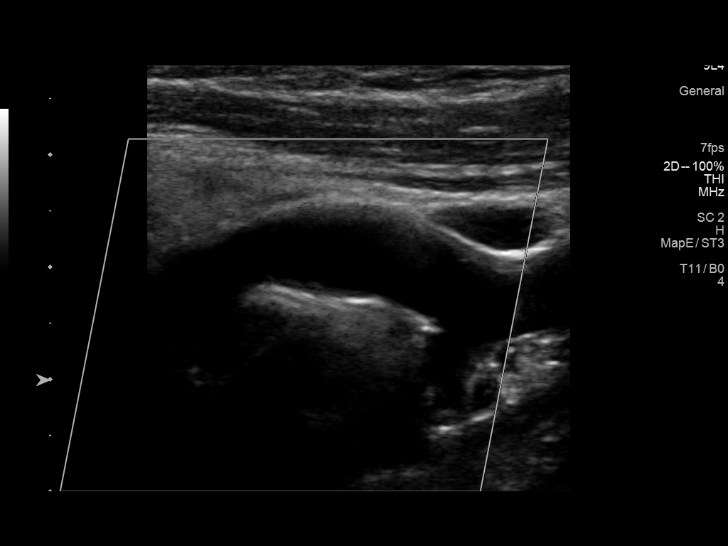
[im 72/72]
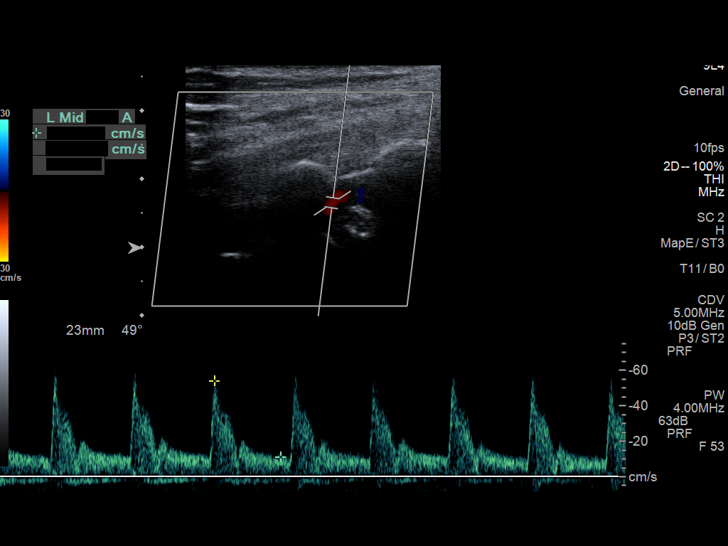

[13 of 24 positions shown; findings below may reference images not displayed]

FINDINGS: Criteria: Quantification of carotid stenosis is based on velocity
parameters that correlate the residual internal carotid diameter
with NASCET-based stenosis levels, using the diameter of the distal
internal carotid lumen as the denominator for stenosis measurement.

The following velocity measurements were obtained:

RIGHT

ICA: 83/12 cm/sec

CCA: 106/19 cm/sec

SYSTOLIC ICA/CCA RATIO:

ECA: 124 cm/sec

LEFT

ICA: 88/10 cm/sec

CCA: 127/16 cm/sec

SYSTOLIC ICA/CCA RATIO:

ECA: 74 cm/sec

RIGHT CAROTID ARTERY: Mild tortuosity of the right common carotid
artery (image 10). There is a moderate amount of eccentric echogenic
plaque within the right carotid bulb (image 20), extending to
involve the origin and proximal aspects of the right internal
carotid artery (image 28), not resulting in elevated peak systolic
velocities within the interrogated course of the right internal
carotid artery to suggest a hemodynamically significant stenosis.

RIGHT VERTEBRAL ARTERY:  Antegrade Flow

LEFT CAROTID ARTERY: There is a minimal amount of atherosclerotic
plaque involving the origin and proximal aspects of the left
internal carotid artery (image 62), not resulting in elevated peak
systolic velocities within the interrogated course of the left
internal carotid artery to suggest a hemodynamically significant
stenosis.

LEFT VERTEBRAL ARTERY:  Antegrade Flow
IMPRESSION: Minimal to moderate amount of bilateral atherosclerotic plaque,
right greater than left, not resulting in a hemodynamically
significant stenosis within either internal carotid artery.

## 2024-04-24 NOTE — Progress Notes (Signed)
 Assessment/Plan:   Dementia likely due to Alzheimer disease with behavioral disturbance   Theresa Mendez is a very pleasant 78 y.o. RH female with a history of dementia likely due to Alzheimer disease with behavioral disturbance seen today in follow up for memory loss. Patient is currently on memantine  10 mg twice daily.  As recall, she is no longer on donepezil  because the functional medicine doctor discontinued it.  For mood she is on bupropion  and Cymbalta per PCP. Patient is able to participate on ADLs with some assistance, and her mood is stable.    Follow up in 6  months. Continue memantine  10 mg twice daily, side effects discussed  Continue 24/7 supervision Recommend good control of her cardiovascular risk factors Continue to control mood as per PCP     Subjective:    This patient is accompanied in the office by her sister who supplements the history.  Previous records as well as any outside records available were reviewed prior to todays visit. Patient was last seen on 10/20/2023.    Any changes in memory since last visit? A little bit worse.  She has very short attention, gets tired with some tasks, able to perform simple ones such as washing dishes, cleaning, folding clothes.  She likes watching TV.  She attends [employer] 3 times a week, enjoys socializing. repeats oneself?  Endorsed Disoriented when walking into a room?  Sometimes she does not recognize the rooms for example the bathroom with the kitchen and wants to urinate in it.  She needs to be redirected.   Leaving objects?  She likes to collect paper towels and the chips bags and then places them folded, in drawers.  She picks up objects as well.   Wandering behavior?  denies   Any personality changes since last visit?  Denies.   Any worsening depression?:  Denies.   Hallucinations or paranoia?  Denies.   Seizures? denies    Any sleep changes?  Sleeps well, occasionally she has vivid dreams,  denies REM behavior or  sleepwalking   Sleep apnea?   Denies.   Any hygiene concerns?  Her sister has to assist her but very cooperative. Independent of bathing and dressing?  Endorsed  Does the patient needs help with medications?  Caregiver is in charge   Who is in charge of the finances?  Sister is in charge    Any changes in appetite? Eats well  She drinks plenty water with coaching   Patient have trouble swallowing? Denies.   Does the patient cook? No Any headaches?   denies   Any vision changes? 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  At times she forgets how to walk and entanglements her legs.  She uses a walker for stability.    Recent falls or head injuries? Denies.     Unilateral weakness, numbness or tingling? Denies.   Any tremors?  Denies    Any anosmia?  Denies   Any incontinence of urine?  Endorsed when she laughs , wears diapers  Any bowel dysfunction?   Denies      Patient lives with her sister and her niece      History on Initial Assessment 02/27/2020: This is a 78 year old left-handed woman with a history of hypertension, hyperlipidemia, diabetes, dementia, presenting to establish care. She was previously living in [place] and moved to [facility] to be closer to her sister and POA. She states her memory is not  good. Records were reviewed. Records indicate that memory changes started in 2014, she tended to be repetitive.  Prior Neuropsychological testing in 2014 and 2016 indicated an amnestic profile suggestive of Alzheimer's disease. MRI brain in 2014 showed significant frontal, parietal, and temporal atrophy. No significant vascular changes. MOCA score 15/30 in 08/2017 on her last visit with her neurologist. She is on Donepezil  10mg  daily and Memantine  ER 14mg  daily without side effects. She states her husband is still living in their house (he is deceased). She asks have I been diagnosed with Alzheimer's? 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 Medications are administered at [facility]. She asks where is [employer]? Her older sister with dementia also resides there. Her POA manages finances. She does not drive. She states she is independent with dressing and bathing, Princella reminds her that she is assisted with this. She goes to the dining hall for meals. She states sleep is good. She denies any headaches, dizziness, diplopia, dysarthria/dysphagia, focal numbness/tingling/weakness, bowel/bladder dysfunction, anosmia, or tremors. She has chronic back pain and is doing physical therapy, although she does not recall this and her sister reminds her she has it several times a week. No personality changes, paranoia. She was previously on lorazepam but has not needed it. Sometimes she has mentioned seeing snakes, but not regularly. There is a strong family history of dementia in her mother, maternal grandmother, older sister. She does not drink alcohol.   PREVIOUS MEDICATIONS: Donepezil  (the functional doctor told her not to take it )  CURRENT MEDICATIONS:  Outpatient Encounter Medications as of 04/25/2024  Medication Sig   acetaminophen  (TYLENOL ) 325 MG tablet Take 650 mg by mouth every 6 (six) hours as needed for moderate pain.    amLODipine  (NORVASC ) 5 MG tablet amlodipine  5 mg tablet  TAKE 1 TABLET BY MOUTH EVERY DAY   buPROPion  (WELLBUTRIN ) 75 MG tablet Take by mouth.   clobetasol ointment (TEMOVATE) 0.05 % clobetasol 0.05 % topical ointment  PLEASE SEE ATTACHED FOR DETAILED DIRECTIONS   DULoxetine (CYMBALTA) 30 MG capsule duloxetine 30 mg capsule,delayed release  TAKE ONE CAPSULE BY MOUTH EVERY DAY   ezetimibe (ZETIA) 10 MG tablet ezetimibe 10 mg tablet  TAKE 1 TABLET BY MOUTH EVERY DAY FOR CHOLESTEROL   hydrALAZINE (APRESOLINE) 100 MG tablet Take 100 mg by mouth 3 (three) times daily.   levocetirizine (XYZAL) 5 MG tablet levocetirizine 5 mg tablet  TAKE 1 TABLET BY  MOUTH EVERY DAY   lisinopril  (PRINIVIL ,ZESTRIL ) 40 MG tablet Take 40 mg by mouth daily.   memantine  (NAMENDA ) 10 MG tablet memantine  10 mg tablet  Take 1 tablet twice a day by oral route.   metFORMIN (GLUCOPHAGE) 500 MG tablet metformin 500 mg tablet  Take 1 tablet twice a day by oral route.   metoprolol  tartrate (LOPRESSOR ) 100 MG tablet metoprolol  tartrate 100 mg tablet  Take 1 tablet every day by oral route.   naproxen sodium (ANAPROX) 550 MG tablet Take 500 mg by mouth 2 (two) times daily.   sitaGLIPtin (JANUVIA) 100 MG tablet Januvia 100 mg tablet  Take 1 tablet every day by oral route.   spironolactone (ALDACTONE) 25 MG tablet Take 0.5 tablets by mouth daily.   No facility-administered encounter medications on file as of 04/25/2024.       04/15/2021   12:00 PM  MMSE - Mini Mental State Exam  Orientation to time 0  Orientation to Place 0  Registration  3  Attention/ Calculation 0  Recall 0  Language- name 2 objects 2  Language- repeat 1  Language- follow 3 step command 3  Language- read & follow direction 1  Write a sentence 1  Copy design 0  Total score 11       No data to display          Objective:     PHYSICAL EXAMINATION:    VITALS:   Vitals:   04/25/24 1502  Resp: 20  SpO2: 98%    GEN:  The patient appears stated age and is in NAD. HEENT:  Normocephalic, atraumatic.   Neurological examination:  General: NAD, well-groomed, appears stated age. Orientation: The patient is alert.  Not oriented to person, place and date Cranial nerves: There is good facial symmetry.The speech is fluent and clear. No aphasia or dysarthria. Fund of knowledge is reduced. Recent and remote memory are impaired. Attention and concentration are reduced. Able to name objects and repeat phrases.  Hearing is intact to conversational tone.   Sensation: Sensation is intact to light touch throughout Motor: Strength is at least antigravity x4. DTR's 2/4 in UE/LE     Movement  examination: Tone: There is normal tone in the UE/LE Abnormal movements:  no tremor.  No myoclonus.  No asterixis.   Coordination:There is no decremation with RAM's. Normal finger to nose  Gait and Station: The patient has difficulty arising out of a deep-seated chair without the use of the hands. Needs a walker to ambulate .The patient's stride length is short.  Gait is cautious and wider based     Thank you for allowing us  the opportunity to participate in the care of this nice patient. Please do not hesitate to contact us  for any questions or concerns.   Total time spent on today's visit was 30 minutes dedicated to this patient today, preparing to see patient, examining the patient, ordering tests and/or medications and counseling the patient, documenting clinical information in the EHR or other health record, independently interpreting results and communicating results to the patient/family, discussing treatment and goals, answering patient's questions and coordinating care.  Cc:  [facility]  Mainor Hellmann 04/25/2024 3:27 PM

## 2024-04-25 ENCOUNTER — Ambulatory Visit: Admitting: Physician Assistant

## 2024-04-25 ENCOUNTER — Encounter: Payer: Self-pay | Admitting: Physician Assistant

## 2024-04-25 VITALS — BP 133/83 | HR 67 | Resp 20 | Ht 61.0 in

## 2024-04-25 DIAGNOSIS — G301 Alzheimer's disease with late onset: Secondary | ICD-10-CM | POA: Diagnosis not present

## 2024-04-25 DIAGNOSIS — F02818 Dementia in other diseases classified elsewhere, unspecified severity, with other behavioral disturbance: Secondary | ICD-10-CM

## 2024-04-25 NOTE — Patient Instructions (Signed)
1.Continue Memantine  10 mg 2 times a day 3. Continue  the mood medications   5.Continue using a walker and the cane  6 Follow-up in 6 months, call for any changes     FALL PRECAUTIONS: Be cautious when walking. Scan the area for obstacles that may increase the risk of trips and falls. When getting up in the mornings, sit up at the edge of the bed for a few minutes before getting out of bed. Consider elevating the bed at the head end to avoid drop of blood pressure when getting up. Walk always in a well-lit room (use night lights in the walls). Avoid area rugs or power cords from appliances in the middle of the walkways. Use a walker or a cane if necessary and consider physical therapy for balance exercise. Get your eyesight checked regularly.   HOME SAFETY: Consider the safety of the kitchen when operating appliances like stoves, microwave oven, and blender. Consider having supervision and share cooking responsibilities until no longer able to participate in those. Accidents with firearms and other hazards in the house should be identified and addressed as well.   ABILITY TO BE LEFT ALONE: If patient is unable to contact 911 operator, consider using LifeLine, or when the need is there, arrange for someone to stay with patients. Smoking is a fire hazard, consider supervision or cessation. Risk of wandering should be assessed by caregiver and if detected at any point, supervision and safe proof recommendations should be instituted.  RECOMMENDATIONS FOR ALL PATIENTS WITH MEMORY PROBLEMS: 1. Continue to exercise (Recommend 30 minutes of walking everyday, or 3 hours every week) 2. Increase social interactions - continue going to [place] and enjoy social gatherings with friends and family 3. Eat healthy, avoid fried foods and eat more fruits and vegetables 4. Maintain adequate blood pressure, blood sugar, and blood cholesterol level. Reducing the risk of stroke and cardiovascular disease also helps  promoting better memory. 5. Avoid stressful situations. Live a simple life and avoid aggravations. Organize your time and prepare for the next day in anticipation. 6. Sleep well, avoid any interruptions of sleep and avoid any distractions in the bedroom that may interfere with adequate sleep quality 7. Avoid sugar, avoid sweets as there is a strong link between excessive sugar intake, diabetes, and cognitive impairment The Mediterranean diet has been shown to help patients reduce the risk of progressive memory disorders and reduces cardiovascular risk. This includes eating fish, eat fruits and green leafy vegetables, nuts like almonds and hazelnuts, walnuts, and also use olive oil. Avoid fast foods and fried foods as much as possible. Avoid sweets and sugar as sugar use has been linked to worsening of memory function.  There is always a concern of gradual progression of memory problems. If this is the case, then we may need to adjust level of care according to patient needs. Support, both to the patient and caregiver, should then be put into place.

## 2024-05-19 ENCOUNTER — Ambulatory Visit (HOSPITAL_COMMUNITY)

## 2024-10-23 ENCOUNTER — Ambulatory Visit: Admitting: Physician Assistant
